# Patient Record
Sex: Female | Born: 1950
Health system: Southern US, Community
[De-identification: ages and names within clinical notes are randomized; demographics above are authoritative.]

## PROBLEM LIST (undated history)

## (undated) DIAGNOSIS — R928 Other abnormal and inconclusive findings on diagnostic imaging of breast: Secondary | ICD-10-CM

## (undated) DIAGNOSIS — Q211 Atrial septal defect, unspecified: Secondary | ICD-10-CM

## (undated) DIAGNOSIS — M1990 Unspecified osteoarthritis, unspecified site: Secondary | ICD-10-CM

## (undated) DIAGNOSIS — F419 Anxiety disorder, unspecified: Secondary | ICD-10-CM

## (undated) DIAGNOSIS — K529 Noninfective gastroenteritis and colitis, unspecified: Secondary | ICD-10-CM

## (undated) DIAGNOSIS — R609 Edema, unspecified: Secondary | ICD-10-CM

## (undated) DIAGNOSIS — I2489 Other forms of acute ischemic heart disease: Secondary | ICD-10-CM

## (undated) DIAGNOSIS — M549 Dorsalgia, unspecified: Secondary | ICD-10-CM

## (undated) DIAGNOSIS — Z87442 Personal history of urinary calculi: Secondary | ICD-10-CM

## (undated) DIAGNOSIS — I10 Essential (primary) hypertension: Secondary | ICD-10-CM

## (undated) DIAGNOSIS — Z951 Presence of aortocoronary bypass graft: Secondary | ICD-10-CM

## (undated) DIAGNOSIS — R4189 Other symptoms and signs involving cognitive functions and awareness: Secondary | ICD-10-CM

## (undated) DIAGNOSIS — M109 Gout, unspecified: Secondary | ICD-10-CM

## (undated) DIAGNOSIS — R0989 Other specified symptoms and signs involving the circulatory and respiratory systems: Secondary | ICD-10-CM

## (undated) DIAGNOSIS — I209 Angina pectoris, unspecified: Secondary | ICD-10-CM

## (undated) DIAGNOSIS — I248 Other forms of acute ischemic heart disease: Secondary | ICD-10-CM

## (undated) DIAGNOSIS — F32A Depression, unspecified: Secondary | ICD-10-CM

## (undated) DIAGNOSIS — Z22322 Carrier or suspected carrier of Methicillin resistant Staphylococcus aureus: Secondary | ICD-10-CM

## (undated) DIAGNOSIS — T7491XA Unspecified adult maltreatment, confirmed, initial encounter: Secondary | ICD-10-CM

## (undated) DIAGNOSIS — E7800 Pure hypercholesterolemia, unspecified: Secondary | ICD-10-CM

## (undated) DIAGNOSIS — I2510 Atherosclerotic heart disease of native coronary artery without angina pectoris: Secondary | ICD-10-CM

## (undated) DIAGNOSIS — E119 Type 2 diabetes mellitus without complications: Secondary | ICD-10-CM

## (undated) DIAGNOSIS — G8929 Other chronic pain: Secondary | ICD-10-CM

## (undated) DIAGNOSIS — K219 Gastro-esophageal reflux disease without esophagitis: Secondary | ICD-10-CM

## (undated) DIAGNOSIS — I739 Peripheral vascular disease, unspecified: Secondary | ICD-10-CM

## (undated) DIAGNOSIS — F329 Major depressive disorder, single episode, unspecified: Secondary | ICD-10-CM

## (undated) DIAGNOSIS — R0602 Shortness of breath: Secondary | ICD-10-CM

## (undated) DIAGNOSIS — A6000 Herpesviral infection of urogenital system, unspecified: Secondary | ICD-10-CM

## (undated) DIAGNOSIS — M8580 Other specified disorders of bone density and structure, unspecified site: Secondary | ICD-10-CM

## (undated) DIAGNOSIS — L309 Dermatitis, unspecified: Secondary | ICD-10-CM

## (undated) DIAGNOSIS — Z9861 Coronary angioplasty status: Secondary | ICD-10-CM

## (undated) DIAGNOSIS — J449 Chronic obstructive pulmonary disease, unspecified: Secondary | ICD-10-CM

## (undated) DIAGNOSIS — I259 Chronic ischemic heart disease, unspecified: Secondary | ICD-10-CM

## (undated) DIAGNOSIS — I6529 Occlusion and stenosis of unspecified carotid artery: Secondary | ICD-10-CM

## (undated) DIAGNOSIS — M545 Low back pain, unspecified: Secondary | ICD-10-CM

## (undated) HISTORY — PX: EYE SURGERY: SHX253

## (undated) HISTORY — PX: OTHER SURGICAL HISTORY: SHX169

## (undated) HISTORY — DX: Presence of aortocoronary bypass graft: Z95.1

## (undated) HISTORY — DX: Dermatitis, unspecified: L30.9

## (undated) HISTORY — DX: Occlusion and stenosis of unspecified carotid artery: I65.29

## (undated) HISTORY — DX: Angina pectoris, unspecified: I20.9

## (undated) HISTORY — DX: Other forms of acute ischemic heart disease: I24.89

## (undated) HISTORY — DX: Coronary angioplasty status: Z98.61

## (undated) HISTORY — DX: Other abnormal and inconclusive findings on diagnostic imaging of breast: R92.8

## (undated) HISTORY — PX: TUBAL LIGATION: SHX77

## (undated) HISTORY — DX: Atherosclerotic heart disease of native coronary artery without angina pectoris: I25.10

## (undated) HISTORY — DX: Chronic obstructive pulmonary disease, unspecified: J44.9

## (undated) HISTORY — DX: Other forms of acute ischemic heart disease: I24.8

## (undated) HISTORY — DX: Low back pain, unspecified: M54.50

## (undated) HISTORY — DX: Carrier or suspected carrier of methicillin resistant Staphylococcus aureus: Z22.322

## (undated) HISTORY — DX: Atrial septal defect: Q21.1

## (undated) HISTORY — DX: Noninfective gastroenteritis and colitis, unspecified: K52.9

## (undated) HISTORY — DX: Shortness of breath: R06.02

## (undated) HISTORY — DX: Peripheral vascular disease, unspecified: I73.9

## (undated) HISTORY — DX: Other specified symptoms and signs involving the circulatory and respiratory systems: R09.89

## (undated) HISTORY — DX: Unspecified adult maltreatment, confirmed, initial encounter: T74.91XA

## (undated) HISTORY — DX: Herpesviral infection of urogenital system, unspecified: A60.00

## (undated) HISTORY — DX: Unspecified osteoarthritis, unspecified site: M19.90

## (undated) HISTORY — DX: Gout, unspecified: M10.9

## (undated) HISTORY — DX: Edema, unspecified: R60.9

## (undated) HISTORY — DX: Atrial septal defect, unspecified: Q21.10

---

## 1998-01-15 ENCOUNTER — Ambulatory Visit (HOSPITAL_COMMUNITY): Admission: RE | Admit: 1998-01-15 | Discharge: 1998-01-15 | Payer: Self-pay | Admitting: Cardiology

## 1998-01-15 HISTORY — PX: CORONARY ANGIOPLASTY WITH STENT PLACEMENT: SHX49

## 1998-01-19 ENCOUNTER — Observation Stay (HOSPITAL_COMMUNITY): Admission: AD | Admit: 1998-01-19 | Discharge: 1998-01-20 | Payer: Self-pay | Admitting: Cardiology

## 1998-01-26 ENCOUNTER — Ambulatory Visit (HOSPITAL_COMMUNITY): Admission: RE | Admit: 1998-01-26 | Discharge: 1998-01-27 | Payer: Self-pay | Admitting: Cardiology

## 1998-01-29 ENCOUNTER — Ambulatory Visit (HOSPITAL_COMMUNITY): Admission: RE | Admit: 1998-01-29 | Discharge: 1998-01-29 | Payer: Self-pay | Admitting: Cardiology

## 1998-04-03 ENCOUNTER — Other Ambulatory Visit: Admission: RE | Admit: 1998-04-03 | Discharge: 1998-04-03 | Payer: Self-pay | Admitting: Family Medicine

## 1998-04-16 ENCOUNTER — Inpatient Hospital Stay (HOSPITAL_COMMUNITY): Admission: EM | Admit: 1998-04-16 | Discharge: 1998-04-17 | Payer: Self-pay | Admitting: Emergency Medicine

## 1998-06-01 ENCOUNTER — Ambulatory Visit (HOSPITAL_COMMUNITY): Admission: RE | Admit: 1998-06-01 | Discharge: 1998-06-01 | Payer: Self-pay | Admitting: Family Medicine

## 1998-06-01 ENCOUNTER — Encounter: Payer: Self-pay | Admitting: Family Medicine

## 1998-06-08 ENCOUNTER — Ambulatory Visit (HOSPITAL_COMMUNITY): Admission: RE | Admit: 1998-06-08 | Discharge: 1998-06-08 | Payer: Self-pay | Admitting: Cardiology

## 1998-07-17 ENCOUNTER — Ambulatory Visit (HOSPITAL_COMMUNITY): Admission: RE | Admit: 1998-07-17 | Discharge: 1998-07-17 | Payer: Self-pay | Admitting: Gastroenterology

## 1998-07-17 ENCOUNTER — Encounter: Payer: Self-pay | Admitting: Gastroenterology

## 1998-10-30 ENCOUNTER — Encounter: Admission: RE | Admit: 1998-10-30 | Discharge: 1998-10-30 | Payer: Self-pay | Admitting: Obstetrics & Gynecology

## 1998-11-07 ENCOUNTER — Encounter: Payer: Self-pay | Admitting: Obstetrics & Gynecology

## 1998-11-07 ENCOUNTER — Ambulatory Visit (HOSPITAL_COMMUNITY): Admission: RE | Admit: 1998-11-07 | Discharge: 1998-11-07 | Payer: Self-pay | Admitting: Obstetrics & Gynecology

## 1998-11-13 ENCOUNTER — Encounter: Admission: RE | Admit: 1998-11-13 | Discharge: 1998-11-13 | Payer: Self-pay | Admitting: Obstetrics & Gynecology

## 1998-11-26 ENCOUNTER — Ambulatory Visit (HOSPITAL_COMMUNITY): Admission: RE | Admit: 1998-11-26 | Discharge: 1998-11-26 | Payer: Self-pay | Admitting: Obstetrics & Gynecology

## 1998-12-21 ENCOUNTER — Encounter: Admission: RE | Admit: 1998-12-21 | Discharge: 1998-12-21 | Payer: Self-pay | Admitting: Obstetrics & Gynecology

## 2000-08-17 ENCOUNTER — Ambulatory Visit (HOSPITAL_COMMUNITY): Admission: RE | Admit: 2000-08-17 | Discharge: 2000-08-18 | Payer: Self-pay | Admitting: Cardiology

## 2000-08-17 HISTORY — PX: CORONARY ANGIOPLASTY WITH STENT PLACEMENT: SHX49

## 2001-01-14 ENCOUNTER — Inpatient Hospital Stay (HOSPITAL_COMMUNITY): Admission: EM | Admit: 2001-01-14 | Discharge: 2001-01-24 | Payer: Self-pay

## 2001-01-19 HISTORY — PX: CORONARY ANGIOPLASTY WITH STENT PLACEMENT: SHX49

## 2001-01-20 ENCOUNTER — Encounter: Payer: Self-pay | Admitting: Cardiology

## 2001-01-20 ENCOUNTER — Encounter: Payer: Self-pay | Admitting: Surgery

## 2001-01-20 HISTORY — PX: CORONARY ANGIOPLASTY WITH STENT PLACEMENT: SHX49

## 2001-01-21 ENCOUNTER — Encounter: Payer: Self-pay | Admitting: Surgery

## 2001-01-22 ENCOUNTER — Encounter: Payer: Self-pay | Admitting: Surgery

## 2001-01-23 ENCOUNTER — Encounter: Payer: Self-pay | Admitting: Surgery

## 2001-04-20 ENCOUNTER — Encounter: Admission: RE | Admit: 2001-04-20 | Discharge: 2001-04-20 | Payer: Self-pay

## 2003-09-13 ENCOUNTER — Encounter (INDEPENDENT_AMBULATORY_CARE_PROVIDER_SITE_OTHER): Payer: Self-pay | Admitting: Specialist

## 2003-09-13 ENCOUNTER — Ambulatory Visit (HOSPITAL_COMMUNITY): Admission: RE | Admit: 2003-09-13 | Discharge: 2003-09-13 | Payer: Self-pay | Admitting: Gastroenterology

## 2003-11-15 ENCOUNTER — Encounter: Admission: RE | Admit: 2003-11-15 | Discharge: 2003-11-15 | Payer: Self-pay | Admitting: General Surgery

## 2003-11-22 ENCOUNTER — Ambulatory Visit (HOSPITAL_COMMUNITY): Admission: RE | Admit: 2003-11-22 | Discharge: 2003-11-22 | Payer: Self-pay | Admitting: Cardiology

## 2003-12-05 ENCOUNTER — Encounter (INDEPENDENT_AMBULATORY_CARE_PROVIDER_SITE_OTHER): Payer: Self-pay | Admitting: Specialist

## 2003-12-05 ENCOUNTER — Inpatient Hospital Stay (HOSPITAL_COMMUNITY): Admission: RE | Admit: 2003-12-05 | Discharge: 2003-12-07 | Payer: Self-pay | Admitting: Obstetrics and Gynecology

## 2004-02-02 HISTORY — PX: CORONARY ANGIOPLASTY WITH STENT PLACEMENT: SHX49

## 2004-02-04 ENCOUNTER — Inpatient Hospital Stay (HOSPITAL_COMMUNITY): Admission: EM | Admit: 2004-02-04 | Discharge: 2004-02-05 | Payer: Self-pay

## 2004-04-30 ENCOUNTER — Ambulatory Visit (HOSPITAL_COMMUNITY): Admission: RE | Admit: 2004-04-30 | Discharge: 2004-04-30 | Payer: Self-pay | Admitting: Obstetrics and Gynecology

## 2004-04-30 ENCOUNTER — Encounter (INDEPENDENT_AMBULATORY_CARE_PROVIDER_SITE_OTHER): Payer: Self-pay | Admitting: *Deleted

## 2005-09-24 ENCOUNTER — Ambulatory Visit (HOSPITAL_COMMUNITY): Admission: RE | Admit: 2005-09-24 | Discharge: 2005-09-24 | Payer: Self-pay | Admitting: Cardiology

## 2005-10-20 ENCOUNTER — Ambulatory Visit (HOSPITAL_COMMUNITY): Admission: RE | Admit: 2005-10-20 | Discharge: 2005-10-20 | Payer: Self-pay | Admitting: Interventional Cardiology

## 2006-05-19 IMAGING — CR DG CHEST 2V
2 series · 2 of 2 positions shown · non-contrast
Comparison: 02/03/04.

CLINICAL DATA: PPD.  Shortness of breath.  Chest complaints.  Smoker.  Hypertension.  CABG. 
 CHEST ? 2 VIEW:

[view not recorded (1 of 2)]
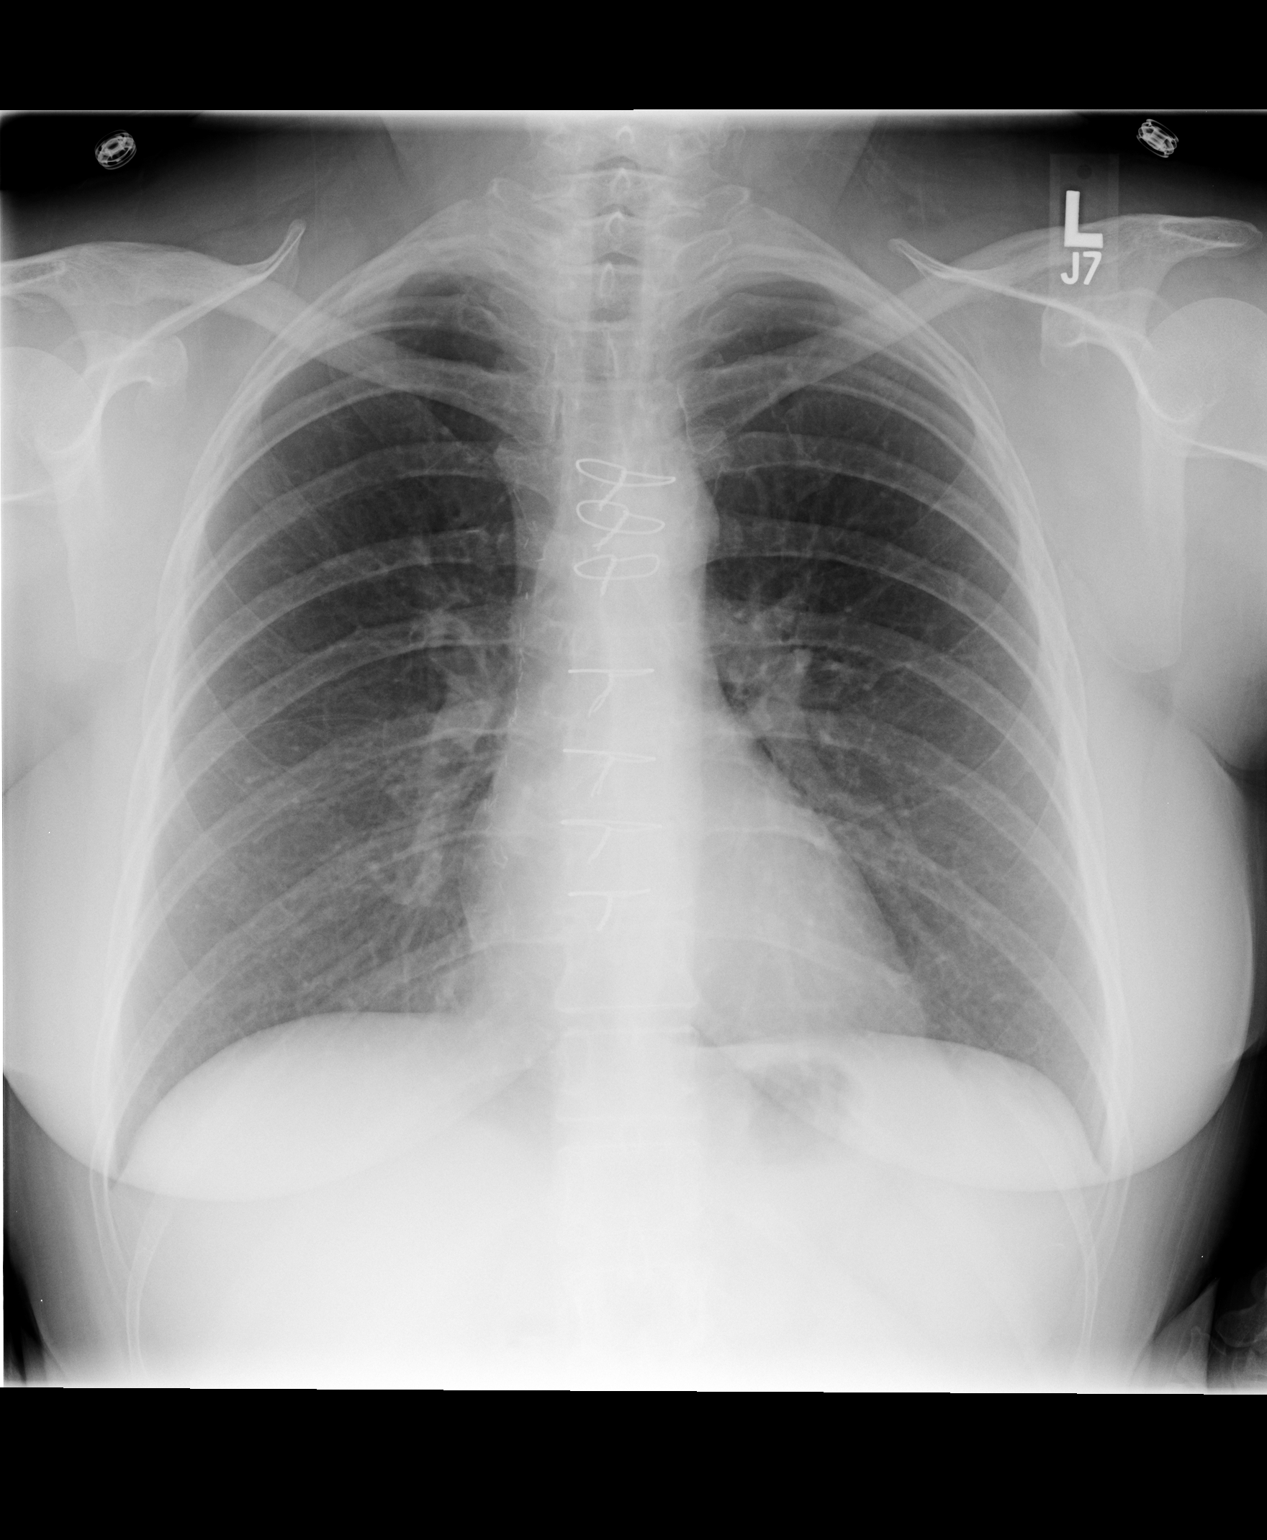

[view not recorded (2 of 2)]
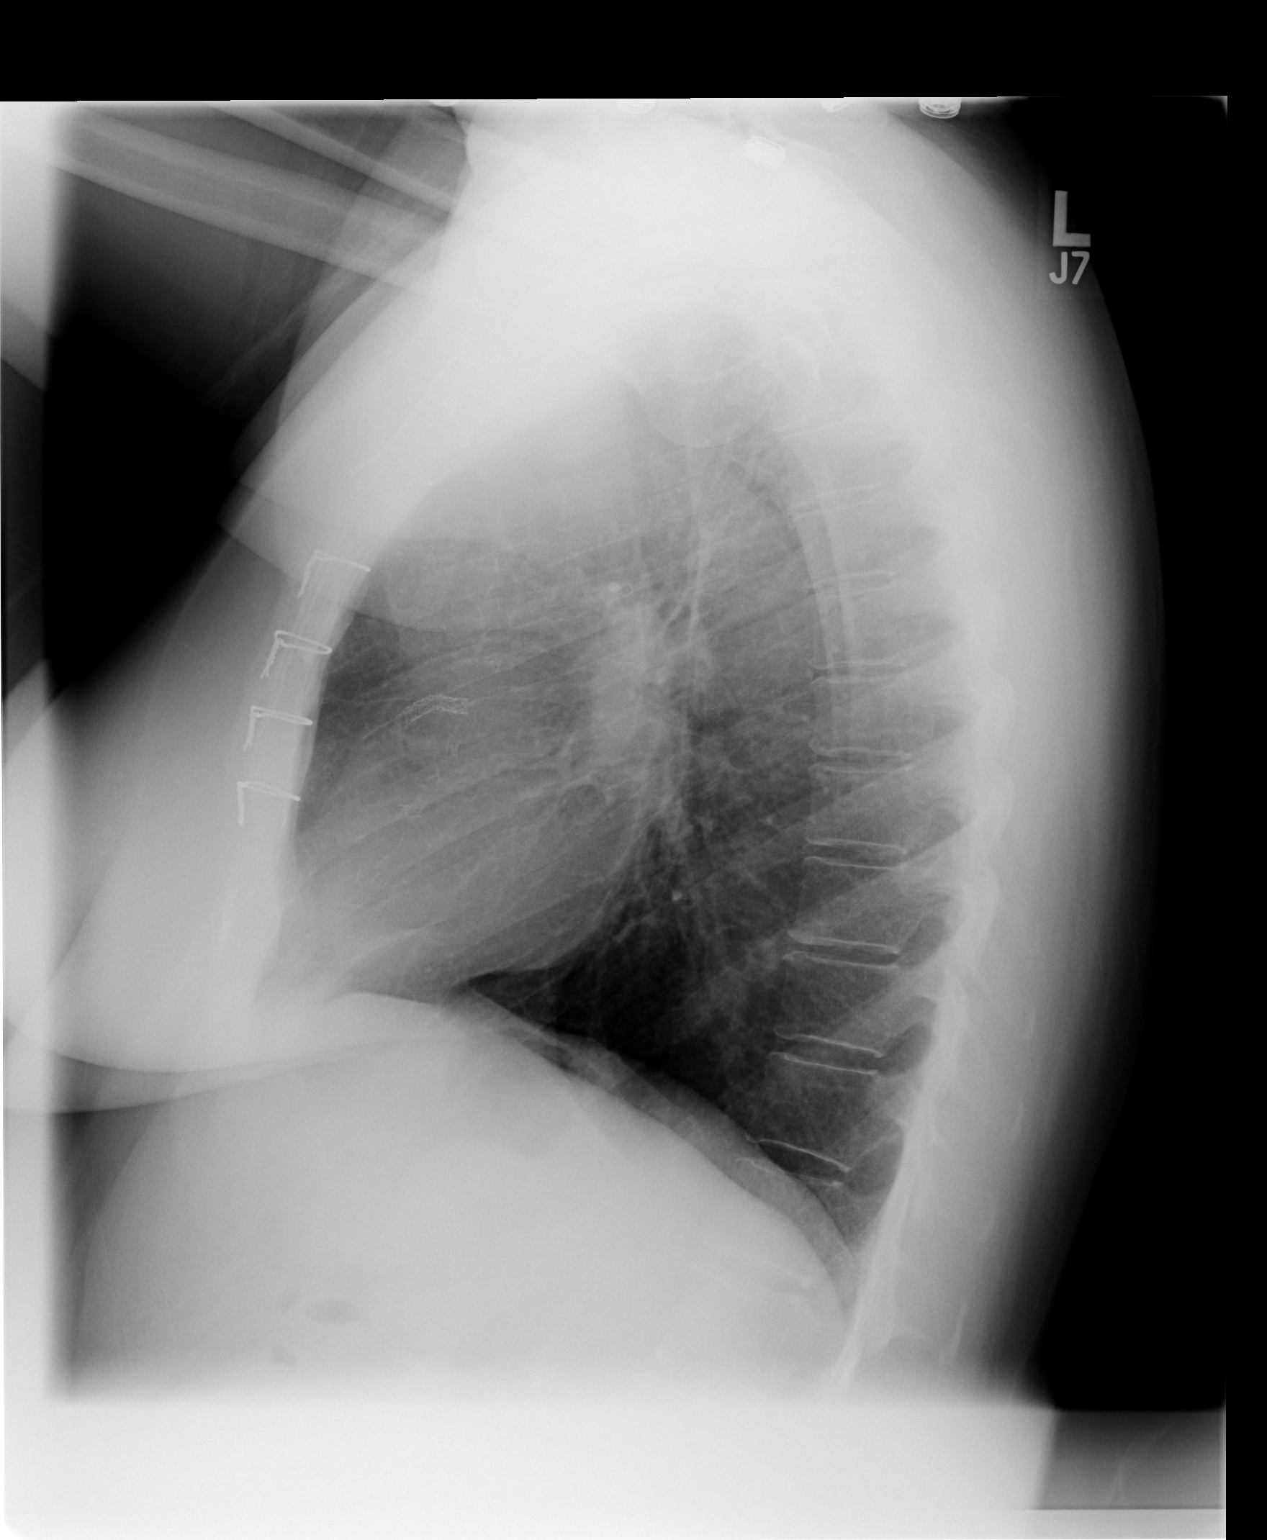

[2 of 2 positions shown; findings below may reference images not displayed]

FINDINGS: The heart size and mediastinal contours are within normal limits.  Both lungs are clear.  The visualized skeletal structures are unremarkable.  Sternally are seen mediastinal clips.  Calcific density in the native coronary artery branch and also coronary artery stent noted on the lateral view.
IMPRESSION: No active cardiopulmonary disease.

## 2008-06-18 ENCOUNTER — Inpatient Hospital Stay (HOSPITAL_COMMUNITY): Admission: EM | Admit: 2008-06-18 | Discharge: 2008-06-20 | Payer: Self-pay | Admitting: Emergency Medicine

## 2008-09-05 ENCOUNTER — Ambulatory Visit: Payer: Self-pay | Admitting: *Deleted

## 2008-09-26 ENCOUNTER — Ambulatory Visit: Payer: Self-pay | Admitting: Vascular Surgery

## 2009-01-24 ENCOUNTER — Ambulatory Visit: Payer: Self-pay | Admitting: Family Medicine

## 2009-01-24 ENCOUNTER — Inpatient Hospital Stay (HOSPITAL_COMMUNITY): Admission: EM | Admit: 2009-01-24 | Discharge: 2009-01-26 | Payer: Self-pay | Admitting: *Deleted

## 2009-01-25 ENCOUNTER — Encounter: Payer: Self-pay | Admitting: Family Medicine

## 2010-04-25 ENCOUNTER — Inpatient Hospital Stay (HOSPITAL_COMMUNITY): Admission: EM | Admit: 2010-04-25 | Discharge: 2010-04-27 | Payer: Self-pay | Admitting: Emergency Medicine

## 2010-04-26 ENCOUNTER — Encounter (INDEPENDENT_AMBULATORY_CARE_PROVIDER_SITE_OTHER): Payer: Self-pay | Admitting: Emergency Medicine

## 2010-04-26 ENCOUNTER — Ambulatory Visit: Payer: Self-pay | Admitting: Vascular Surgery

## 2010-05-13 ENCOUNTER — Emergency Department (HOSPITAL_COMMUNITY): Admission: EM | Admit: 2010-05-13 | Discharge: 2010-05-13 | Payer: Self-pay | Admitting: Emergency Medicine

## 2010-10-15 ENCOUNTER — Emergency Department (HOSPITAL_COMMUNITY)
Admission: EM | Admit: 2010-10-15 | Discharge: 2010-10-16 | Payer: Self-pay | Source: Home / Self Care | Admitting: Emergency Medicine

## 2010-10-16 LAB — CBC
HCT: 37.5 % (ref 36.0–46.0)
Hemoglobin: 13.5 g/dL (ref 12.0–15.0)
MCH: 34.9 pg — ABNORMAL HIGH (ref 26.0–34.0)
MCHC: 36 g/dL (ref 30.0–36.0)
MCV: 96.9 fL (ref 78.0–100.0)
Platelets: 199 10*3/uL (ref 150–400)
RBC: 3.87 MIL/uL (ref 3.87–5.11)
RDW: 14.1 % (ref 11.5–15.5)
WBC: 8.2 10*3/uL (ref 4.0–10.5)

## 2010-10-16 LAB — COMPREHENSIVE METABOLIC PANEL
ALT: 13 U/L (ref 0–35)
AST: 22 U/L (ref 0–37)
Albumin: 3.7 g/dL (ref 3.5–5.2)
Alkaline Phosphatase: 78 U/L (ref 39–117)
BUN: 5 mg/dL — ABNORMAL LOW (ref 6–23)
CO2: 21 mEq/L (ref 19–32)
Calcium: 9.1 mg/dL (ref 8.4–10.5)
Chloride: 108 mEq/L (ref 96–112)
Creatinine, Ser: 0.95 mg/dL (ref 0.4–1.2)
GFR calc Af Amer: >60 mL/min (ref >60)
GFR calc non Af Amer: >60 mL/min (ref >60)
Glucose, Bld: 92 mg/dL (ref 70–99)
Potassium: 3.8 mEq/L (ref 3.5–5.1)
Sodium: 138 mEq/L (ref 135–145)
Total Bilirubin: 0.5 mg/dL (ref 0.3–1.2)
Total Protein: 6.4 g/dL (ref 6.0–8.3)

## 2010-10-16 LAB — PROTIME-INR
INR: 0.93 (ref 0.00–1.49)
Prothrombin Time: 12.7 seconds (ref 11.6–15.2)

## 2010-10-16 LAB — APTT: aPTT: 29 seconds (ref 24–37)

## 2010-10-16 LAB — LIPASE, BLOOD: Lipase: 18 U/L (ref 11–59)

## 2010-10-21 LAB — DIFFERENTIAL
Basophils Absolute: 0 10*3/uL (ref 0.0–0.1)
Basophils Relative: 0 % (ref 0–1)
Eosinophils Absolute: 0.2 10*3/uL (ref 0.0–0.7)
Eosinophils Relative: 2 % (ref 0–5)
Lymphocytes Relative: 15 % (ref 12–46)
Lymphs Abs: 1.2 10*3/uL (ref 0.7–4.0)
Monocytes Absolute: 0.8 10*3/uL (ref 0.1–1.0)
Monocytes Relative: 10 % (ref 3–12)
Neutro Abs: 6 10*3/uL (ref 1.7–7.7)
Neutrophils Relative %: 73 % (ref 43–77)

## 2010-12-14 LAB — POCT CARDIAC MARKERS
CKMB, poc: <1 ng/mL — ABNORMAL LOW (ref 1.0–8.0)
Myoglobin, poc: 67.4 ng/mL (ref 12–200)
Troponin i, poc: <0.05 ng/mL (ref 0.00–0.09)

## 2010-12-14 LAB — DIC (DISSEMINATED INTRAVASCULAR COAGULATION) PANEL (NOT AT ARMC)
D-Dimer, Quant: 0.59 ug/mL-FEU — ABNORMAL HIGH (ref 0.00–0.48)
Fibrinogen: 345 mg/dL (ref 204–475)
INR: 0.98 (ref 0.00–1.49)

## 2010-12-14 LAB — CBC
HCT: 37.9 % (ref 36.0–46.0)
Hemoglobin: 13.4 g/dL (ref 12.0–15.0)
MCH: 37 pg — ABNORMAL HIGH (ref 26.0–34.0)
MCHC: 35.4 g/dL (ref 30.0–36.0)
MCV: 104.8 fL — ABNORMAL HIGH (ref 78.0–100.0)
Platelets: 163 10*3/uL (ref 150–400)
RBC: 3.62 MIL/uL — ABNORMAL LOW (ref 3.87–5.11)
RDW: 13.7 % (ref 11.5–15.5)
WBC: 5.5 10*3/uL (ref 4.0–10.5)

## 2010-12-14 LAB — COMPREHENSIVE METABOLIC PANEL
ALT: 12 U/L (ref 0–35)
AST: 20 U/L (ref 0–37)
Albumin: 3.5 g/dL (ref 3.5–5.2)
Alkaline Phosphatase: 72 U/L (ref 39–117)
BUN: 9 mg/dL (ref 6–23)
CO2: 27 mEq/L (ref 19–32)
Calcium: 9 mg/dL (ref 8.4–10.5)
Chloride: 104 mEq/L (ref 96–112)
Creatinine, Ser: 1 mg/dL (ref 0.4–1.2)
GFR calc Af Amer: >60 mL/min (ref >60)
GFR calc non Af Amer: 57 mL/min — ABNORMAL LOW (ref >60)
Glucose, Bld: 116 mg/dL — ABNORMAL HIGH (ref 70–99)
Potassium: 3.8 mEq/L (ref 3.5–5.1)
Sodium: 139 mEq/L (ref 135–145)
Total Bilirubin: 0.4 mg/dL (ref 0.3–1.2)
Total Protein: 5.9 g/dL — ABNORMAL LOW (ref 6.0–8.3)

## 2010-12-14 LAB — TROPONIN I
Troponin I: 0.01 ng/mL (ref 0.00–0.06)
Troponin I: 0.02 ng/mL (ref 0.00–0.06)
Troponin I: <0.01 ng/mL (ref 0.00–0.06)

## 2010-12-14 LAB — GLUCOSE, CAPILLARY
Glucose-Capillary: 104 mg/dL — ABNORMAL HIGH (ref 70–99)
Glucose-Capillary: 105 mg/dL — ABNORMAL HIGH (ref 70–99)
Glucose-Capillary: 111 mg/dL — ABNORMAL HIGH (ref 70–99)
Glucose-Capillary: 118 mg/dL — ABNORMAL HIGH (ref 70–99)
Glucose-Capillary: 145 mg/dL — ABNORMAL HIGH (ref 70–99)
Glucose-Capillary: 146 mg/dL — ABNORMAL HIGH (ref 70–99)
Glucose-Capillary: 230 mg/dL — ABNORMAL HIGH (ref 70–99)

## 2010-12-14 LAB — RAPID URINE DRUG SCREEN, HOSP PERFORMED
Amphetamines: NOT DETECTED
Barbiturates: NOT DETECTED
Benzodiazepines: POSITIVE — AB
Cocaine: NOT DETECTED
Opiates: POSITIVE — AB
Tetrahydrocannabinol: NOT DETECTED

## 2010-12-14 LAB — URINE CULTURE: Colony Count: >=100000

## 2010-12-14 LAB — URINALYSIS, ROUTINE W REFLEX MICROSCOPIC
Bilirubin Urine: NEGATIVE
Glucose, UA: NEGATIVE mg/dL
Hgb urine dipstick: NEGATIVE
Ketones, ur: NEGATIVE mg/dL
Nitrite: NEGATIVE
Protein, ur: NEGATIVE mg/dL
Specific Gravity, Urine: 1.022 (ref 1.005–1.030)
Urobilinogen, UA: 1 mg/dL (ref 0.0–1.0)
pH: 5.5 (ref 5.0–8.0)

## 2010-12-14 LAB — PROTIME-INR
INR: 0.94 (ref 0.00–1.49)
Prothrombin Time: 12.5 seconds (ref 11.6–15.2)

## 2010-12-14 LAB — ETHANOL: Alcohol, Ethyl (B): <5 mg/dL (ref 0–10)

## 2010-12-14 LAB — DIFFERENTIAL
Basophils Absolute: 0 10*3/uL (ref 0.0–0.1)
Basophils Relative: 1 % (ref 0–1)
Eosinophils Absolute: 0.3 10*3/uL (ref 0.0–0.7)
Eosinophils Relative: 6 % — ABNORMAL HIGH (ref 0–5)
Lymphocytes Relative: 32 % (ref 12–46)
Lymphs Abs: 1.7 10*3/uL (ref 0.7–4.0)
Monocytes Absolute: 0.7 10*3/uL (ref 0.1–1.0)
Monocytes Relative: 12 % (ref 3–12)
Neutro Abs: 2.8 10*3/uL (ref 1.7–7.7)
Neutrophils Relative %: 50 % (ref 43–77)

## 2010-12-14 LAB — LIPID PANEL
Cholesterol: 134 mg/dL (ref 0–200)
HDL: 56 mg/dL (ref >39)
LDL Cholesterol: 50 mg/dL (ref 0–99)
Total CHOL/HDL Ratio: 2.4 RATIO
Triglycerides: 140 mg/dL (ref <150)
VLDL: 28 mg/dL (ref 0–40)

## 2010-12-14 LAB — CK TOTAL AND CKMB (NOT AT ARMC)
CK, MB: 1.2 ng/mL (ref 0.3–4.0)
CK, MB: 1.3 ng/mL (ref 0.3–4.0)
CK, MB: 1.3 ng/mL (ref 0.3–4.0)
Relative Index: INVALID (ref 0.0–2.5)
Relative Index: INVALID (ref 0.0–2.5)
Relative Index: INVALID (ref 0.0–2.5)
Total CK: 38 U/L (ref 7–177)
Total CK: 44 U/L (ref 7–177)
Total CK: 70 U/L (ref 7–177)

## 2010-12-14 LAB — VITAMIN B1: Vitamin B1 (Thiamine): 12 nmol/L (ref 9–44)

## 2010-12-14 LAB — DIC (DISSEMINATED INTRAVASCULAR COAGULATION)PANEL
Platelets: 158 10*3/uL (ref 150–400)
Prothrombin Time: 12.9 seconds (ref 11.6–15.2)
Smear Review: NONE SEEN
aPTT: 33 seconds (ref 24–37)

## 2010-12-14 LAB — APTT: aPTT: 27 seconds (ref 24–37)

## 2010-12-14 LAB — RPR: RPR Ser Ql: NONREACTIVE

## 2010-12-14 LAB — HEMOGLOBIN A1C
Hgb A1c MFr Bld: 5.4 % (ref <5.7)
Mean Plasma Glucose: 108 mg/dL (ref <117)

## 2010-12-14 LAB — ANA: Anti Nuclear Antibody(ANA): NEGATIVE

## 2010-12-14 LAB — VITAMIN B12: Vitamin B-12: 261 pg/mL (ref 211–911)

## 2011-01-08 LAB — CARDIAC PANEL(CRET KIN+CKTOT+MB+TROPI)
CK, MB: 1.4 ng/mL (ref 0.3–4.0)
Relative Index: INVALID (ref 0.0–2.5)
Total CK: 61 U/L (ref 7–177)
Troponin I: 0.02 ng/mL (ref 0.00–0.06)

## 2011-01-08 LAB — CBC
HCT: 36.2 % (ref 36.0–46.0)
HCT: 41 % (ref 36.0–46.0)
Hemoglobin: 12.9 g/dL (ref 12.0–15.0)
Hemoglobin: 14.6 g/dL (ref 12.0–15.0)
MCHC: 35.7 g/dL (ref 30.0–36.0)
MCHC: 35.7 g/dL (ref 30.0–36.0)
MCV: 104.1 fL — ABNORMAL HIGH (ref 78.0–100.0)
MCV: 104.3 fL — ABNORMAL HIGH (ref 78.0–100.0)
Platelets: 178 10*3/uL (ref 150–400)
Platelets: 222 10*3/uL (ref 150–400)
RBC: 3.47 MIL/uL — ABNORMAL LOW (ref 3.87–5.11)
RBC: 3.93 MIL/uL (ref 3.87–5.11)
RDW: 14.6 % (ref 11.5–15.5)
RDW: 14.8 % (ref 11.5–15.5)
WBC: 5.4 10*3/uL (ref 4.0–10.5)
WBC: 7.7 10*3/uL (ref 4.0–10.5)

## 2011-01-08 LAB — TSH: TSH: 1.125 u[IU]/mL (ref 0.350–4.500)

## 2011-01-08 LAB — BASIC METABOLIC PANEL
BUN: 5 mg/dL — ABNORMAL LOW (ref 6–23)
CO2: 26 mEq/L (ref 19–32)
Calcium: 8.7 mg/dL (ref 8.4–10.5)
Chloride: 106 mEq/L (ref 96–112)
Creatinine, Ser: 0.83 mg/dL (ref 0.4–1.2)
GFR calc Af Amer: >60 mL/min (ref >60)
GFR calc non Af Amer: >60 mL/min (ref >60)
Glucose, Bld: 145 mg/dL — ABNORMAL HIGH (ref 70–99)
Potassium: 3.4 mEq/L — ABNORMAL LOW (ref 3.5–5.1)
Sodium: 143 mEq/L (ref 135–145)

## 2011-01-08 LAB — CK TOTAL AND CKMB (NOT AT ARMC)
CK, MB: 1.3 ng/mL (ref 0.3–4.0)
Relative Index: INVALID (ref 0.0–2.5)
Total CK: 59 U/L (ref 7–177)

## 2011-01-08 LAB — RAPID URINE DRUG SCREEN, HOSP PERFORMED
Amphetamines: NOT DETECTED
Barbiturates: NOT DETECTED
Benzodiazepines: POSITIVE — AB
Cocaine: NOT DETECTED
Opiates: POSITIVE — AB
Tetrahydrocannabinol: NOT DETECTED

## 2011-01-08 LAB — COMPREHENSIVE METABOLIC PANEL
ALT: 9 U/L (ref 0–35)
AST: 17 U/L (ref 0–37)
Albumin: 3.1 g/dL — ABNORMAL LOW (ref 3.5–5.2)
Alkaline Phosphatase: 76 U/L (ref 39–117)
BUN: 3 mg/dL — ABNORMAL LOW (ref 6–23)
CO2: 25 mEq/L (ref 19–32)
Calcium: 8.8 mg/dL (ref 8.4–10.5)
Chloride: 109 mEq/L (ref 96–112)
Creatinine, Ser: 0.79 mg/dL (ref 0.4–1.2)
GFR calc Af Amer: >60 mL/min (ref >60)
GFR calc non Af Amer: >60 mL/min (ref >60)
Glucose, Bld: 100 mg/dL — ABNORMAL HIGH (ref 70–99)
Potassium: 3 mEq/L — ABNORMAL LOW (ref 3.5–5.1)
Sodium: 143 mEq/L (ref 135–145)
Total Bilirubin: 0.4 mg/dL (ref 0.3–1.2)
Total Protein: 5.7 g/dL — ABNORMAL LOW (ref 6.0–8.3)

## 2011-01-08 LAB — POCT I-STAT, CHEM 8
BUN: 4 mg/dL — ABNORMAL LOW (ref 6–23)
Calcium, Ion: 1.19 mmol/L (ref 1.12–1.32)
Chloride: 105 mEq/L (ref 96–112)
Creatinine, Ser: 0.9 mg/dL (ref 0.4–1.2)
Glucose, Bld: 128 mg/dL — ABNORMAL HIGH (ref 70–99)
HCT: 41 % (ref 36.0–46.0)
Hemoglobin: 13.9 g/dL (ref 12.0–15.0)
Potassium: 2.9 mEq/L — ABNORMAL LOW (ref 3.5–5.1)
Sodium: 140 mEq/L (ref 135–145)
TCO2: 26 mmol/L (ref 0–100)

## 2011-01-08 LAB — DIFFERENTIAL
Basophils Absolute: 0.1 10*3/uL (ref 0.0–0.1)
Basophils Relative: 1 % (ref 0–1)
Eosinophils Absolute: 0.2 10*3/uL (ref 0.0–0.7)
Eosinophils Relative: 3 % (ref 0–5)
Lymphocytes Relative: 29 % (ref 12–46)
Lymphs Abs: 2.3 10*3/uL (ref 0.7–4.0)
Monocytes Absolute: 0.7 10*3/uL (ref 0.1–1.0)
Monocytes Relative: 9 % (ref 3–12)
Neutro Abs: 4.5 10*3/uL (ref 1.7–7.7)
Neutrophils Relative %: 58 % (ref 43–77)

## 2011-01-08 LAB — ETHANOL: Alcohol, Ethyl (B): <5 mg/dL (ref 0–10)

## 2011-01-08 LAB — TROPONIN I: Troponin I: 0.01 ng/mL (ref 0.00–0.06)

## 2011-01-08 LAB — APTT: aPTT: 36 seconds (ref 24–37)

## 2011-01-08 LAB — PROTIME-INR
INR: 1.1 (ref 0.00–1.49)
Prothrombin Time: 14.6 seconds (ref 11.6–15.2)

## 2011-01-08 LAB — HEMOGLOBIN A1C
Hgb A1c MFr Bld: 5.6 % (ref 4.6–6.1)
Mean Plasma Glucose: 114 mg/dL

## 2011-02-11 NOTE — Consult Note (Signed)
NAME:  Crystal [name], Crystal [name]                 ACCOUNT NO.:  [id number]   MEDICAL RECORD NO.:  [id number]          PATIENT TYPE:  INP   LOCATION:  3714                         FACILITY:  [facility]   PHYSICIAN:  Michael L. Reynolds, M.D.DATE OF BIRTH:  May 09, 1951   DATE OF CONSULTATION:  01/25/2009  DATE OF DISCHARGE:                                 CONSULTATION   PRIMARY CARE DOCTORS:  Francisca December, MD for cardiovascular and  Di Kindle. Edilia Bo, MD for vascular.   CHIEF COMPLAINT:  Blurred vision.   HISTORY OF PRESENT ILLNESS:  This is a 60 year old Caucasian female with  past medical history of CAD, status post CABG for TVD and hyperlipidemia  and hypertension who apparently started having blurred vision 3 days  prior to admission.  On Monday, January 22, 2009, the patient woke up with  blurred vision with the left being greater than her right.  She states  that her vision slowly improved throughout the day.  She attributed her  blurred vision was most likely due to a sty that she had in her right  eye.  Knowing that she had a sty in her right eye and her dog recently  had a sty in his eye, the patient took the dog's optic medication and  placed in her right eye hoping it would cure the problem.  She states  that this is the only medication change that she has had over the past  week.  The next morning, the patient woke up on January 23, 2009,  approximately around 10 o'clock, her daughter noticed she had a facial  droop on her right.  The patient noticed that she was slightly numb  around her right aspect of her face and her right pupil is still  dilated.  Majority of her vision was blurred when looking at distance  farther away.  The patient continued throughout the day with the right  facial droop and decreased sensation of V1, V2 and V3 portion of her  trigeminal nerve.  She also had slurred speech.  The patient went to bed  at night, woke up the next morning on January 24, 2009, noting that  the  slurred speech, facial numbness, and facial droop had dissipated;  however, her right pupil and blurred vision had not resolved.  It was at  this point that the patient came over to the [facility].  The  patient states that she has taken 4 days worth of her dog's medication,  but she does not know the name of this medication.  At present time, the  patient is comfortable sitting in her bed, has no neurological deficit  as far as the musculoskeletal.  No cranial nerve deficit other than the  dilated right pupil and blurred vision.  Her speech is clear.  Her  mentation is full and the patient is able to carry on a conversation  without any difficulty.   PAST MEDICAL HISTORY:  1. Hypertension.  2. Depression.  3. Irritable bowel syndrome.  4. GERD.  5. CABG of her right coronary artery in 2002.  6. Right superficial femoral atherectomy in 1999.  7. PVD.  8. Nephrolithiasis.  9. CAD.  10.Hypercholesterolemia.   MEDICATIONS:  1. Aspirin 325 daily.  2. Plavix 75 mg p.o. daily.  3. Zydol 50 mg p.o. b.i.d.  4. Allegra 180 mg p.o. daily.  5. Ativan 0.5 mg p.o. p.r.n.  6. Cymbalta 120 mg p.o. daily.  7. Folic acid 1 mg p.o. daily.  8. Mobic 15 mg daily.  9. Niaspan 1 g p.o. at bedtime.  10.Norvasc 50 mg p.o. b.i.d.  11.Imdur 20 mg p.o. b.i.d.  12.Prilosec 20 mg p.o. b.i.d.   ALLERGIES:  PENICILLIN, SULFA, and BACTRIM.   FAMILY HISTORY:  Mother is deceased secondary to MI.   SOCIAL HISTORY:  The patient smokes approximately 1 pack per day.  She  has done this for 20 years.  She denies any drinking or illicit  drugs.  She lives with her son and is retired.   REVIEW OF SYSTEMS:  All negative with the exception of above.   PHYSICAL EXAMINATION:  GENERAL:  The patient is alert and oriented.  She  is conversant.  She is able to follow 2-3 step commands.  She is well  enunciated.  PULMONARY:  Clear to auscultation bilaterally.  No rhonchi or wheezing.  CARDIOVASCULAR:   S1 and S2.  Regular rate and rhythm.  NECK:  Negative for bruits and supple.  ABDOMEN:  Soft, nontender, and nondistended with bowel sounds in all 4  quadrants.  SKIN:  Warm, dry and intact.  No excoriations, but ulcerations noted.  NEUROLOGIC:  Her left pupil is approximately 2 mm, reactive.  Her right  pupil is approximately 3 mm, nonreactive with normal hippus action.  She  does have a conjugate gaze.  Extraocular muscles are intact.  She has no  ptosis.  Visual fields are grossly intact; however, she does have  blurred vision with far objects.  No nystagmus was noted.  No saccadic  movements were noted.  Face was symmetric.  Tongue was midline.  Uvula  was midline.  The patient had no dysarthria or aphasia.  Sensation was  full, pinprick to light touch throughout V1 to V3 bilaterally.  Head  shrug, shoulder turn was full range of motion and strength.  Cerebellum,  coordination, finger-to-nose, heel-to-shin were both smooth without any  dysmetria.  Fine motor movements were within normal limits with known  dysmetria or clumsiness.   Gait was full within normal limits with good arm swing.  The patient  took normal steps and turned without any signs of disequilibrium.   Motor 5/5 grossly with no clonus, tremors.  She has good tone, good  bulk.  No asterixis noted.  She has full range of motion of all  extremities.   Reflexes are 3+ throughout with downgoing toes bilaterally.   Drift negative bilaterally upper and lower extremities.   Sensation is full of pinprick, vibration and light touch bilaterally.  Stereognosis and proprioception are grossly intact.   STROKE RISK FACTORS:  1. Hypertension.  2. Hypercholesterolemia.  3. Smoking.  4. Coronary artery disease.  5. Peripheral vascular disease.  6. She has an ABCD2 score of 3.   TPA was not given at the present time secondary to quick resolutions of  symptoms and unknown time of onset.   LABORATORY DATA:  Sodium is 143,  potassium 3.4, chloride is 106, bicarb  26, BUN 5, creatinine 0.85, HbA1c is 5.6 with glucose being 145, white  blood cell count is 5.4, hemoglobin  and hematocrit 12.9 and 36.2, and  platelets 174.  Urine drug screen showed positive for benzodiazepines  and opiates.   IMAGING AND TESTS:  MRI of the head showed no acute ischemia or insult.  MRA of the head showed no aneurysm or atherosclerosis noted throughout  the intracranial vessels.   ASSESSMENT:  1. Pupillary dilatation secondary to foreign substance for question      iritis.  2. Transient facial droop and dysarthria, question transient ischemic      attack.   TREATMENT AND PLAN:  Telemetry Doppler, echo, and labs are all  unrevealing, would not change any of her meds except for consideration  of adding statin and would recommend the patient seeing an outpatient  ophthalmology.     ______________________________  Felicie Morn, PA-C      Marolyn Hammock. Thad Ranger, M.D.  Electronically Signed    DS/MEDQ  D:  01/25/2009  T:  01/26/2009  Job:  [id number]   cc:   Casimiro Needle L. Thad Ranger, M.D.  Nestor Ramp, MD

## 2011-02-11 NOTE — Consult Note (Signed)
VASCULAR SURGERY CONSULTATION   Hausmann, Holly P  DOB:  03/24/1951                                       09/26/2008  ZOXWR#:[id number]   I saw the patient in the office today in consultation concerning her  right foot pain.  She was referred by Lonie Peak.  This is a pleasant  60 year old woman who states that approximately 3 months ago she noted  the gradual onset of pain on the dorsum of her right foot.  Her symptoms  are aggravated by walking but occur with standing or walking.  She also  gets some throbbing pain in her foot at night.  I do not get any clear-  cut history of rest pain or nonhealing ulcers.  She has had no  significant symptoms on the left side.  She has had specifically no hip,  thigh or calf claudication.  Her symptoms have gradually progressed over  the last 2 months.  She cannot remember any trauma to her foot.  She has  had no significant swelling.   PAST MEDICAL HISTORY:  Significant for hypertension and  hypercholesterolemia.  She denies any history of diabetes, history of  previous myocardial infarction, history of congestive heart failure or  history of COPD.   FAMILY HISTORY:  She is unaware of any history of premature  cardiovascular disease.   SOCIAL HISTORY:  She is single.  She has seven children.  She smoked a  pack per day of cigarettes for 40 years.   REVIEW OF SYSTEMS AND MEDICATIONS:  Are documented on the medical  history form in the chart.   PHYSICAL EXAMINATION:  General:  This is a pleasant 60 year old woman  who appears her stated age.  Her blood pressure 107/65, heart rate is  89.  HEENT:  Unremarkable.  Neck:  Supple.  There is no cervical  lymphadenopathy.  I do not detect any carotid bruits.  Lungs:  Clear  bilaterally to auscultation.  Cardiac:  She has a regular rate and  rhythm.  Abdomen:  Soft and nontender.  I cannot palpate an aneurysm.  She has palpable femoral popliteal and posterior tibial pulses  bilaterally.  I cannot palpate dorsalis pedis pulses.  Both feet appear  warm and well-perfused.  There is no evidence of atheroembolic disease.  There is no significant lower extremity swelling.  Neurologic:  Exam is  nonfocal.   She did have a Doppler study in our office on 09/05/2008 which showed  triphasic waveforms in the dorsalis pedis and posterior tibial positions  bilaterally.  ABIs were normal at 100% at rest.  With exercise there was  a slight drop in her ankle pressures bilaterally which quickly returned  to baseline.  Based on her Doppler study she could potentially have some  mild iliac disease which would explain the slight drop in pressure with  exercise in her ABIs.  However, this would present typically as hip or  thigh claudication and I get no history of this.  This would not present  with foot pain unless she had evidence of atheroembolic disease.  However, there is no evidence of this on exam.  She has a normal ABI at  rest and a palpable posterior tibial pulse and I do not think that her  pain in the right foot is from ischemia.  Other potential sources could  be an injury that she is not aware of versus arthritis or gout although  gout would be less likely.  I really do not think arteriography is  indicated as I am not convinced her convinced her symptoms are related  to ischemia and she has palpable pedal pulses.  If she ever developed  any significant discoloration to suggest atheroembolic disease then  aortogram would be helpful to look for any potential source of embolus.  However, currently on exam there is no evidence of this.  I will be  happy to see her back at any time if any new vascular issues arise.   Di Kindle. Edilia Bo, M.D.  Electronically Signed  CSD/MEDQ  D:  09/26/2008  T:  09/27/2008  Job:  [id number]   cc:   Lonie Peak

## 2011-02-11 NOTE — Discharge Summary (Signed)
Crystal [name], DAVISSON                 ACCOUNT NO.:  [id number]   MEDICAL RECORD NO.:  [id number]          PATIENT TYPE:  INP   LOCATION:  3729                         FACILITY:  [facility]   PHYSICIAN:  Francisca December, M.D.  DATE OF BIRTH:  03-26-1951   DATE OF ADMISSION:  06/18/2008  DATE OF DISCHARGE:  06/20/2008                               DISCHARGE SUMMARY   DISCHARGE DIAGNOSES:  1. Chest pain, microvascular angina exacerbated by tobacco abuse.  2. Tobacco abuse, smoking cessation counseling.  3. Known coronary artery disease, history of stenting of the right      coronary artery and rotablator of the diagonal branch in the past      and ultimately in 2002, she had a single vessel RIMA to the right      coronary artery.  4. History of right superficial femoral atherectomy and vein      angioplasty in 1999 for possible embolism.  5. Nephrolithiasis.  6. History of transvaginal hysterectomy and bilateral salpingo-      oophorectomy.  7. Rectal fistula repair.  8. Reported previous cardiomyopathy that had resolved.  9. Irritable bowel disease.  10.Dyslipidemia.  11.Chronic depression.  12.Frary-term medication use.  13.Allergy to PENICILLIN.   HOSPITAL COURSE:  [name] is a 60 year old female with the above-  mentioned coronary artery disease.  On the date of admission, she  developed right jaw pain, which was different from previous chest pains  that she had had in the past.  Because of her history, we admitted her  to the hospital.  She was known also to have potassium on admission of  2.9.  This was repleted.  She was started on IV heparin and IV  nitroglycerin and ultimately, she underwent cardiac catheterization.   Her cardiac catheterization essentially showed no obstructive disease  and her discomfort was felt to be microvascular in nature.  She remained  in the hospital overnight and on June 20, 2008, she was ready for  discharge to home.   Lab studies during her  hospital stay include cardiac isoenzymes, which  were negative.  Hemoglobin 11.1, hematocrit 32.3, white count 5.5,  platelets 186.  Folate grater than 20.  Vitamin B12 greater than 2000.  Sodium 138, potassium 3.5 (upon repletion), BUN 8, creatinine 0.85.  Chest x-ray, COPD/emphysema.  No acute chest findings.  EKG, normal  sinus rhythm, rate of 66 with nonspecific ST-T wave changes diffusely.   DISCHARGE INSTRUCTIONS:  Follow up with Dr. Amil Amen on October 24, 2008  at 1:30 p.m.  She is instructed to stop smoking.  She is instructed to  call for a Chantix prescription.   She is to continue the following medications:  1. Allegra 180 mg a day.  2. Enteric-coated aspirin 325 mg a day.  3. Ativan 0.5 mg p.r.n.  4. Cymbalta 120 mg a day.  5. Folic acid 1 mg a day.  6. Mobic 15 mg a day.  7. Multivitamin daily.  8. Niaspan 1000 mg nightly.  9. Norvasc 5 mg a day.  10.Plavix 75 mg a day.  11.Pletal 50  mg twice a day.  12.Prilosec 20 mg twice a day.  13.Isosorbide dinitrate 20 mg p.o. b.i.d.   [name] is discharged to home in stable and improved condition.      Guy Franco, P.A.      Francisca December, M.D.  Electronically Signed    LB/MEDQ  D:  06/20/2008  T:  06/21/2008  Job:  [id number]   cc:   Francisca December, M.D.  Harrold Donath PA Anna Genre

## 2011-02-11 NOTE — H&P (Signed)
NAMEMIDA, CORY                 ACCOUNT NO.:  [id number]   MEDICAL RECORD NO.:  [id number]          PATIENT TYPE:  INP   LOCATION:  3714                         FACILITY:  [facility]   PHYSICIAN:  Nestor Ramp, MD        DATE OF BIRTH:  [birth year]   DATE OF ADMISSION:  01/24/2009  DATE OF DISCHARGE:                              HISTORY & PHYSICAL   PRIMARY CARE PHYSICIANS:  1. Francisca December, MD of Cardiology.  2. Di Kindle. Edilia Bo, MD, Vascular.   CHIEF COMPLAINT:  Right facial weakness, slurred speech, and dilated  right pupil.   HISTORY OF PRESENT ILLNESS:  This is a 60 year old female with past  medical history significant for CAD, status post CABG; PVD, status post  right superficial femoral atherectomy; hypertension; and hyperlipidemia  who presents with complaints of right facial weakness, slurred speech,  dilated right pupil intermittently x2 days.  Also notes right leg  weakness and right arm weakness.  All symptoms have now resolved except  for right pupil dilation.  The patient had nausea and emesis today,  nonbloody which has now resolved.  Also reports some blurred vision as  well as headache today, which have now resolved.  The patient states  that she has been using a dog eye medication for the past few days to  relieve styes in the right eye.  She is unsure what the name of this  medication is.  The patient denies previous eye surgery or other eye  medications at this time.  Also reports 2 weeks ago, history of right  leg swelling and pain x3 days, which has not returned and is now  resolved.   PAST MEDICAL HISTORY:  1. CAD with history of stent, CABG of right coronary artery in 2002.  2. Right superficial femoral atherectomy in 1999.  3. Peripheral vascular disease.  4. History of nephrolithiasis.  5. IBS.  6. Hyperlipidemia.  7. Depression.  8. Hypertension.  9. GERD.   Note that the patient has not had stroke or TIA symptoms in the past.   PAST  SURGICAL HISTORY:  1. CABG of right coronary artery in 2002.  2. Transvaginal hysterectomy/BSO.  3. Rectal fistula repair.  4. Right superficial femoral atherectomy in 1999.   ALLERGIES:  1. PENICILLIN, rash.  2. SULFA, BACTRIM, nausea.   MEDICATIONS:  1. Allegra 180 mg p.o. daily.  2. Aspirin 325 mg p.o. daily.  3. Ativan 0.5 mg p.o. p.r.n. anxiety.  4. Cymbalta 120 mg p.o. daily.  5. Folic acid 1 mg p.o. daily.  6. Mobic 15 mg daily.  7. Multivitamin.  8. Niaspan 1000 mg p.o. nightly.  9. Norvasc 5 mg p.o. daily.  10.Plavix 75 mg p.o. daily.  11.Pletal 50 mg p.o. b.i.d.  12.Prilosec 20 mg p.o. b.i.d.  13.Imdur 20 mg p.o. b.i.d.   SOCIAL HISTORY:  The patient lives with son.  He is retired from working  in the school system.  Smokes one pack per day for over 20 years.  Denies alcohol or illicit drug use.   FAMILY HISTORY:  1. Mother deceased of MI in 3s.  2. Father with history of ulcers, deceased from pneumonia at around      age 46.  3. Nine siblings, brothers with PVD and hypertension.  No family      history of diabetes.   REVIEW OF SYSTEMS:  1. Positive as per HPI as well as for chest pain, which is the      patient's baseline angina.  2. Dyspnea which is baseline for the patient.  The patient does not      have O2 requirement at home.  3. Abdominal pain with the patient's IBS.   Review of systems is negative for fevers, chills, sweats, appetite  change, weight change, worsening cough, hematemesis, bright red blood  per rectum, melena, hematuria, dysuria, incontinence, dizziness,  vertigo, polyuria, or polydipsia.   CODE STATUS:  Full code.   PHYSICAL EXAMINATION:  VITAL SIGNS:  Temperature 98.1 to 98.4, pulse 64  to 72, respiratory rate 18, blood pressure 100 to 120 over 62 to 72, O2  saturation 94% on room air.  GENERAL:  NAD.  Pleasant female.  HEENT:  NCAT, EOMI, right anisocoria fixed and dilated pupil with left  pupil reactive to direct and  consensual stimulation.  Vision grossly  intact.  Moist mucous membranes.  NECK:  No cervical lymphadenopathy.  CARDIOVASCULAR:  Regular rate and rhythm.  No murmurs, rubs, or gallops.  2+ radial pulses, 1+ pedal pulses bilaterally.  No carotid bruits.  No  JVD.  LUNGS:  Clear to auscultation bilaterally with normal work of breathing.  ABDOMEN:  Soft, mildly tender right upper and lower quadrants, and  nondistended.  Positive bowel sounds.  BACK:  Nontender.  EXTREMITIES:  No edema.  NEUROLOGIC:  Cranial nerves II-XII intact except as above for eye exam,  cerebellar intact.  No Romberg, no pronator drift.  Alert and oriented  x3.  Brisk reflexes symmetric throughout with 1-beat clonus bilaterally  at the ankles.  MUSCULOSKELETAL:  5/5 strength in bilateral upper and lower extremities.   LABORATORIES AND STUDIES:  White blood cell count 7.7, hemoglobin 14.6,  hematocrit 41.0, platelets 222.  Sodium 140, potassium 2.9, chloride  105, BUN 4, creatinine 0.9, glucose 128.  MRI/MRA head and neck  negative.  Fasting lipid panel from June 18, 2008, with total  cholesterol 124, triglycerides 116, HDL 42, LDL 59.   ASSESSMENT AND PLAN:  This is a 60 year old female with past medical  history significant for coronary artery disease, status post coronary  artery bypass graft, hypertension, and peripheral vascular disease who  presents with symptoms concerning for transient ischemic attack.  1. Transient ischemic attack symptoms.  The patient's ABCD squared      score equals 4.  This places her at moderate risk category.  We      will check 2D echo, A1c, TSH, and EKG.  Risk factors include      tobacco abuse, coronary artery disease, peripheral vascular      disease, hyperlipidemia, and hypertension.  We will recheck CBC and      BMET in the morning.  We will check coags.  We will check bedside      swallow study prior to starting diet.  The patient without      neurological deficits on  exam except for right anisocoria as above.      We will determine exactly what medication the patient has been      using in right eye to determine if this  could be cause, however,      unlikely given the patient's other presenting symptoms, we will      consult Neurology and we will ask specifically regarding      anticoagulation in this patient.  We will obtain cardiac enzymes x2      sets, place on tele bed.  We will do q.3 h. neuro check at this      point.  We will hold on Norvasc for now as the patient normotensive      and would not want to prevent hypoperfusion in this patient with      transient ischemic attack symptoms.  2. Hypertension, normotensive currently.  We will hold Norvasc to      prevent hypoperfusion as above with possible transient ischemic      attack symptoms.  3. Coronary artery disease/peripheral vascular disease.  The patient      had followup arranged with vascular on January 26, 2009.  We will      continue home medications aspirin, Plavix, and Pletal at this time.  4. Hyperlipidemia.  Continue Niaspan.  Last fasting lipid panel as      above within normal limits.  We will not recheck one at this time.  5. Fluids, electrolytes, nutrition/gastrointestinal, pending bedside      swallow.  If pass, would advance to heart-healthy diet.  We will      give you IV at this time.  We will replete potassium.  6. Depression, Cymbalta, stable.  7. Gastroesophageal reflux disease.  The patient on Prilosec at home.      We will start Protonix.  8. Irritable bowel syndrome, stable.  We will continue to follow.  9. Code status, full code.   DISPOSITION:  Pending TIA workup, pending Neurology eval and  recommendations in the morning.      Bobby Rumpf, MD  Electronically Signed      Nestor Ramp, MD  Electronically Signed    KC/MEDQ  D:  01/24/2009  T:  01/25/2009  Job:  [id number]

## 2011-02-11 NOTE — H&P (Signed)
Crystal [name], Crystal [name]                 ACCOUNT NO.:  [id number]   MEDICAL RECORD NO.:  [id number]          PATIENT TYPE:  EMS   LOCATION:  MAJO                         FACILITY:  [facility]   PHYSICIAN:  Georga Hacking, M.D.DATE OF BIRTH:  Jan 29, 1951   DATE OF ADMISSION:  06/18/2008  DATE OF DISCHARGE:                              HISTORY & PHYSICAL   REASON FOR ADMISSION:  Jaw pain.   HISTORY:  [age]-year-old female with a complicated cardiac  history.  She has a Bench history of atherosclerotic peripheral vascular  disease, tobacco abuse, hypertension and coronary disease.  In 1999 she  had stenting of the right coronary and a Rotablator of the diagonal  branch by Dr. Amil Amen.  She has a previous history of a cardiomyopathy  which is resolved.  She developed in-stent restenosis and after multiple  attempts to keep his open, which were unsuccessful, culminated in a  single vessel in the right internal mammary graft to the right coronary  artery by Dr. Laneta Simmers in April of 2002.  She, because of recurrent pain  and multiple attempts to manage this, was ultimately found to have  occlusion of the mammary graft.  In May of 2005 she was seen by Dr.  Adrian Saran and had Cypher  3.5 x 23 mm drug-eluting stent placed in the  right coronary artery.  She was discharged from [facility] and was  readmitted to [facility] at that time and later discharged by Dr. Amil Amen.  She has continued to use tobacco and had complained of claudication.  A  peripheral angiogram on her showed patency of her iliacs without  significant stenosis and thought to have somewhat small distal leg  vessels.  She has had worsening chest pain recently and reported a  stress test recently in Dr. Amil Amen' office, and according to the  daughters, is set up for a catheterization.  The chest pain is chronic  and has been present for some years.   She developed jaw pain this morning described as right jaw pain, which  was different from  previous chest pain that she had.  She presented to  the emergency room where initial enzymes and an EKG were negative.  She  has continued to have ongoing jaw pain, I was asked to see her and we  decided to admit her to rule out myocardial infarction or atypical  angina.   Her past history is complex.  She has a history of chronic depression  and anxiety, and is under treatment.  She is on two anxiolytics that she  receives from two different physicians.  She has a history of  hypertension, diabetes, ongoing cigarette smoking, peripheral vascular  disease, previous cardiomyopathy that is resolved, previous history of  irritable bowel disease, history of kidney stones, dyslipidemia,  claudication, ongoing tobacco abuse.  She is hypokalemic currently on  admission.   PAST SURGICAL HISTORY:  Right superficial femoral arterectomy and vein  angioplasty on November 03, 1997 for possible embolus.  She has had  extraction of kidney stones previously and also a single-vessel coronary  artery bypass graft by Dr.  Evelene Croon in 2002.  She has had a previous  hysterectomy and repair of pelvic relaxation.  She has had bilateral  salpingo-oophorectomy.  She has had repair of rectal fistula in the  past.   ALLERGIES:  PENICILLIN.   CURRENT MEDICATIONS:  Include:  1. Allegra 180 mg daily.  2. Aspirin 325 mg daily.  3. Ativan  0.5 mg p.r.n.  4. Cymbalta 120 mg daily.  5. Folic acid 1 mg daily.  6. Mobic 15 mg daily.  7. Multivitamins daily.  8. Niaspan 1000 mg nightly.  9. Amlodipine 5 mg daily.  10.Plavix 75 mg daily.  11.Pletal 50 mg b.i.d.  12.Prilosec 20 mg b.i.d.  13.Rozerem 8 mg as needed.  14.Toprol XL 25 mg daily.  15.Vitamin B12 one tablet daily.  16.Xanax 0.5 mg p.r.n.   SOCIAL HISTORY:  She is divorced, has seven children, currently lives  alone, and is on disability.  She had been under significant situational  stress with several sons in the [employer].  One daughter who is  pregnant  at the present time.  Smokes between 1/2 and 1pack of cigarettes per day  for years.  No alcohol.  Currently lives alone.   FAMILY HISTORY:  Recorded in old charts reviewed and unchanged.   REVIEW OF SYSTEMS:  Prior history of depression, severe anxiety.  She  has had dizzy spells as well as mild reduction in her vision.  She has a  history of irritable bowel syndrome and inflammatory bowel disease in  the past, but states that she does  not currently have any active GI  issues.  She has a history of claudication on walking several blocks.  She has mild dyspnea with exertion.  She has some chronic pain syndrome  for which she takes Mobic and has had diffuse arthritis.  Other than as  noted above, the remainder of the review of systems is unremarkable.   EXAMINATION:  She is a middle-aged female appearing older than stated  age, and appears depressed.  Blood pressure is 116/70 sitting, pulse is  80 and regular.  SKIN:  Is warm and dry.  ENT:  EOMI.  PERRLA.  Fundi not examined.  Pharynx negative.  NECK:  Supple.  No masses, no JVD or thyromegaly.  Bilateral carotid  bruits were noted.  LUNGS:  Were clear bilaterally.  CARDIOVASCULAR:  Exam showed normal S1, S2.  There is no S3 or murmur.  ABDOMEN:  Soft and nontender.  There are bilateral femoral bruits noted.  Peripheral pulses are 1+ on  the right and 1/2 to 1+ on the left.  There is no peripheral edema  noted.  NEUROLOGIC:  Normal.   EKG shows minor nonspecific changes in the anterolateral chest leads.  Chest x-ray shows mild cardiomegaly.   LABORATORY DATA:  Shows an MCV of 106, potassium is 2.9.  Renal function  was normal.  CBC was normal.   IMPRESSION:  1. Atypical jaw pain with unremarkable EKG and initial negative      enzymes.  2. Recent chronic chest pain syndrome which was worsening.  3. Coronary artery disease with multiple complex procedures involving      the right coronary artery.  Previous Rotablator  of diagonal branch.  4. Hypertension.  5. Hyperlipidemia under treatment.  6. Depression, anxiety.  7. Hypokalemia.  8. Chronic pain syndrome.  9. Macrocytosis.   RECOMMENDATIONS:  1. Replete potassium.  2. Begin heparin and IV nitroglycerin.  In view of the complex cardiac  history, we will make arrangements for her to proceed with cardiac      catheterization by Dr. Amil Amen.  Keep n.p.o. after midnight.  3. Rule out myocardial infarction with serial enzymes.      Georga Hacking, M.D.  Electronically Signed     WST/MEDQ  D:  06/18/2008  T:  06/18/2008  Job:  [id number]   cc:   Francisca December, M.D.  Lonie Peak, P.A.

## 2011-02-14 NOTE — Cardiovascular Report (Signed)
[facility]  Patient:    Crystal [name], NICASTRO                        MRN: [id number] Adm. Date:  [id number] Attending:  Corliss Marcus CC:         Francisca December, M.D.   Cardiac Catheterization  PROCEDURE PERFORMED:  Left heart catheterization, coronary angiography, single plane ventriculogram.  INDICATION FOR PROCEDURE:  Silvia Birdsell is a 60 year old female with known coronary artery disease.  Initial intervention was a PTCA of the second diagonal.  This was followed by a PTCA and stent of the right coronary artery proximally.  She later returned and had a stent placed in the ostium of the right coronary artery.  Myocardial perfusion study using adenosine stress Cardiolite performed in July 2001 had revealed no significant ischemia; however, the patient persisted in having atypical angina which led to the placement of the stent in the ostium of the right coronary artery.  Ms. Ferrucci continues to smoke.  Presented this admission with dizziness, chest pain which has been intermittent with radiation to her jaw and mid back.  Myocardial infarction was eliminated via serial cardiac enzymes.  DESCRIPTION OF PROCEDURE:  After obtaining written informed consent, the patient was brought to the cardiac catheterization lab in the postabsorptive state.  Preoperative sedation was achieved using IV Versed.  A 6-French hemostasis sheath was placed in the right femoral artery using the modified Seldinger technique approach.  Single plane ventriculogram was performed in the RAO position using a 6-French pigtail curved catheter.  Selective coronary angiography was performed using a #4 left Judkins catheter.  The 5-French Judkins had significant damping with engagement.  This was felt to be secondary to side wall pressure.  The left main was viewed in multiple views. Difficulty in engaging the right coronary artery secondary to the stent which was protruding into the aorta.   Successful engagement was made using a 6-French JR3.5 guiding catheter.  Following multiple views, it was noted significant in-stent stenosis of the ostium of the right coronary artery.  FINDINGS:  AO pressure was 107/53.  LV pressure was 104/15.  There was no gradient noted on pullback.  Single plane ventriculogram revealed normal wall motion.  The ejection fraction was estimated at 65%.  There was post PVC MR only.  No valvular calcification or coronary artery calcification was noted.  Coronary arteriography: 1. The left main was short and bifurcated into the left anterior descending    and circumflex vessel.  There was no identifiable disease in the left main    coronary artery. 2. The left anterior descending was a small vessel.  The site of the previous    PTCA remained patent.  The ongoing LAD was a transapical vessel.  No    critical stenosis was noted. 3. The circumflex vessel tapered proximally.  There was a 20-30% ostial    stenosis noted in the left circumflex.  The circumflex gives rise to a    large obtuse marginal which covers the lateral surface of the LV. 4. The right coronary artery is dominant for the posterior circulation.  The    previously placed stent in the right coronary artery extrudes into the    aorta.  This made engagement difficult.  There was approximately an 80%    in-stent stenosis noted in the right coronary artery.  IMPRESSION: 1. In-stent stenosis of the proximal right coronary artery. 2. Previously angioplastied diagonal  branch remains patent. 3. Preserved LV function with an ejection fraction of 65%.  The films will be reviewed with Dr. Amil Amen.  Further recommendations pending this review. DD:  01/15/01 TD:  01/17/01 Job: [id number]

## 2011-02-14 NOTE — Cardiovascular Report (Signed)
. [facility]  Patient:    Crystal [name], Crystal [name]                        MRN: [id number] Proc. Date: 08/17/00 Adm. Date:  [id number] Attending:  Corliss Marcus CC:         [facility]  Cardiac Catheterization Laboratory   Cardiac Catheterization  PROCEDURES PERFORMED: 1. Left heart catheterization. 2. Coronary angiography. 3. Left ventriculogram. 4. Percutaneous transluminal coronary angioplasty (cutting balloon)/stent)    proximal and ostial right coronary artery. 5. Intravascular ultrasound.  INDICATIONS:  Crystal [name] is a 60 year old woman who has known ASCVD and is 2-1/2 years status post PTCA of the second diagonal and PTCA/stent of the right coronary proximally.  She has had recurrent atypical angina present now for about three months.  A myocardial perfusion study using adenosine stress performed in July 2001 showed no significant ischemia.  However, the patient has persisted having atypical angina.  She is returned to the catheterization lab at this time to identify possible progressive or possible recurrent disease and provide preferred therapeutic options.  PROCEDURAL NOTE:  Left heart catheterization was performed following percutaneous insertion of a 5-French catheter sheath utilizing an anterior approach over a guiding J-wire into the right femoral artery.  A 110 cm pigtail catheter was then used to measure pressures in the ascending aorta and in the left ventricle as well as perform left ventriculography in a 30 degree RAO angulation.  Left coronary angiography was then performed using a 5-French #4 left Judkins catheter.  The 5-French Judkins would "intubate the right coronary deeply" causing pressure damping and I was therefore not successful with this device.  Therefore, a 5-French catheter sheath was exchanged over a Viramontes guiding J-wire for a 6-French catheter sheath.  The 6-French right #4 Judkins catheter was advanced to  the ascending aorta and right coronary artery angiography was performed in an RAO angulation.  This clearly showed ostial and proximal in-stent restenosis.  We then proceeded with the intervention.  The 6-French catheter sheath was exchanged over a Opheim guiding J-wire for a 7-French catheter sheath.  The 6-French catheter sheath was inserted percutaneously into the right femoral vein utilizing an anterior approach over a guiding J-wire for intravenous access.  The patient then received 4000 units of heparin intravenously as well as a double bolus of Integrilin and constant infusion.  Subsequent ACT was 234 seconds.  A 7-French FR 3.5 Scimed Wise guiding catheter was advanced to the ascending aorta where the right coronary os was engaged partially.  A 0.014 inch Scimed/Luque intracoronary guide wire was advanced across the lesion without difficulty.  A 3.25/15 mm [employer] cutting balloon was passed into the proximal right coronary artery. It was inflated on six occasions.  Peak atmospheric pressure was 8. Peak duration was 114 seconds.  These maneuvers resulted in a 30-40% ostial stenosis persisting.  Therefore, a 3.5/9 mm Scimed near [employer] intracoronary stent was passed into the proximal portion of the artery. Approximately 6 mm of the stent was overlapping with the previously placed. There was good coverage of the ostium.  It was ultimately deployed at 16 atmospheres for 65 seconds.  We then proceeded within intravascular ultrasound.  A Scimed Atlantis ultrasound catheter was advanced over the guide wire and into the proximal right coronary.  It was withdrawn through the stent and into the guiding catheter with a mechanical automatic pullback device.  The images were analyzed.  Repeat angiography performed with and without the guide wire in place and then the guiding catheter was removed.  Repeat ACT was 226 seconds The patient was transported to the recovery area in  stable condition with intact distal pulse.  HEMODYNAMICS:  Systemic arterial pressure was 112/62 with a mean of 83 mmHg. There was no systolic gradient across the aortic valve.  The left ventricular end-diastolic pressure was 15 mmHg, ventriculogram and unchanged post-ventriculogram.  ANGIOGRAPHY:  VENTRICULOGRAPHY:  The left ventriculogram demonstrated intact global left ventricular size and systolic function with no significant regional wall motion abnormality.  The calculated ejection fraction utilizing a single plane cine method was 64%.  There was no mitral regurgitation.  There was no valvular calcification or coronary artery calcification seen.  CORONARY ARTERIOGRAPHY:  There was a right dominant coronary system present.  1.  LEFT MAIN:  The left main coronary artery was short and normal.  2.  LEFT ANTERIOR DESCENDING:  The left anterior descending artery and its     branches were minimally disease; there were luminal irregularities     throughout the course of this vessel.  At the site of previous PTCIA in     the diagonal branch there is a 35% residual stenosis at the ostium.  The     ongoing LAD was large transapical and diseased.  3.  LEFT CIRCUMFLEX:  The left circumflex tapered proximally where there was a     20% ostial stenosis.  It then gave rise to a single branching marginal     which covered the entire lateral surface of the LV toward the obtuse     margin.  There was a 30% stenosis at the origin of this large marginal.  4.  RIGHT CORONARY ARTERY:  The right coronary artery and its branches were     highly diseased; this vessel had a proximal and ostial stenosis at 95%.     The ongoing vessel had luminal irregularities but no significant     obstructions seen.  A moderate sized posterior descending artery, moderate     posterolateral segment and small posterolateral branch are seen to arise     without any significant obstructions.  Following balloon  dilatation and stent implantation, there was no residual  stenosis in the ostium of the right coronary artery.  INTRAVASCULAR ULTRASOUND:  The stented segment at the ostium resulted in a 3.2 x 3.2 mm diameter.  There was an ostial square area of 7.9 mm squared.  This results in an anticipated restenosis rate of 21%.  FINAL IMPRESSION: 1. Atherosclerotic coronary vascular disease single vessel. 2. Status post successful PTCA (cutting balloon) and stent implantation    proximal and ostial right coronary artery. 3. Typical angina was reproduced with device insertion and balloon inflation. 4. Intact left ventricular size and systolic function which represents a    significant improvement since that obtained by angiography in January 1999. D:  08/17/00 TD:  08/18/00 Job: [id number]

## 2011-02-14 NOTE — H&P (Signed)
NAME:  Crystal [name], Crystal [name]                           ACCOUNT NO.:  [id number]   MEDICAL RECORD NO.:  [id number]                   PATIENT TYPE:  INP   LOCATION:  NA                                   FACILITY:  [facility]   PHYSICIAN:  Malachi Pro. Ambrose Mantle, M.D.              DATE OF BIRTH:  [birth year]   DATE OF ADMISSION:  12/05/2003  DATE OF DISCHARGE:                                HISTORY & PHYSICAL   HISTORY OF PRESENT ILLNESS:  A 60 year old white female, para 5-1-0-6,  admitted to the hospital for vaginal hysterectomy, A&[name] repair, and attempt  at strengthening her anal sphincter because of symptomatic cystocele and  fecal incontinence.  This patient had not been seen in our office in many  years until July 2004, when she came complaining of poor control of her  stool, waking up with stool in her panties, and a bulge in her vagina.  She  did have a fourth degree cystocele and poor sphincter tone.  The sphincter  was quite thin.  She was referred to Dr. Kendrick Ranch for evaluation.  He  evaluated her and had her seen by gastroenterology, who according to the  patient, found no abnormality on colonoscopy.  She did not receive any  significant help with her 8 to 10 bowel movements a day, but they do improve  with Imodium.  Colon biopsies were negative.  Because of health problems,  Dr. Earlene Plater could not do the surgery right away, so we had scheduled it for  today and now because of a family death he is unable to perform this surgery  and neither of his associates is able to perform the surgery.  I have  explained to the patient that I have done many sphincter repairs after  delivery, that the procedure is the same it is just a different setting.  I  gave her four options, to cancel the surgery altogether, repeat it later, to  do the vaginal hysterectomy A&[name] repair now and a sphincter repair later, to  have one of Dr. Earlene Plater associates do the sphincter repair at this time which  is not a possibility, and  that I would proceed with the surgery.  She wants  without question to proceed with the surgery today.   ALLERGIES:  PENICILLIN, caused rash and hives.   PAST MEDICAL HISTORY:  1. She had what she states is a clot to her right big toe.  2. She also had coronary artery disease which eventuated in a coronary     artery bypass surgery in 2002.   PAST SURGICAL HISTORY:  1. Coronary artery bypass graft in 2002.  2. Tubal ligation in 1985.  3. Some type of vascular procedure on her leg.   MEDICATIONS:  1. Zoloft.  2. Prilosec.  3. Folic acid.  4. Toprol.  5. Lipitor.  6. Aspirin.  7. Vitamins.  8. Norvasc.  9. Furosemide.  10.      Nitro-Quick.  11.      Nitroglycerin.   SOCIAL HISTORY:  The patient does not drink.  She smokes one pack of  cigarettes a day.   FAMILY HISTORY:  Mother died at 68 of heart problems.  Father died at 24 of  pneumonia.  One sister is living and well.  One sister died of a fire.  Three brothers are living and well.   REVIEW OF SYSTEMS:  The patient has some dizziness, also has some shortness  of breath.  She has been evaluated by Dr. Amil Amen recently with a coronary  catheterization and was found to have single vessel disease 80% of the right  coronary artery, and atypical angina.  He feels there is no contraindication  to the planned surgery, and I have spoken to him personally about this.   PHYSICAL EXAMINATION:  GENERAL:  A well-developed, obese, white female in no  distress.  VITAL SIGNS:  Weight is 143 pounds.  Pulse is 80, blood pressure is 150/80,  respirations 16.  HEENT:  No cranial abnormalities.  Extraocular movements were intact.  Nose  and pharynx are clear.  There is an upper dental plate.  There is a lower  partial.  NECK:  Supple without thyromegaly.  HEART:  Normal size and sounds, no murmurs.  There is a midline scar over  her sternum.  LUNGS:  Clear to [name]&A.  BREASTS:  Soft without masses.  ABDOMEN:  Soft, nontender.  There  are no masses palpable.  PELVIC:  Vulva and vagina are clean.  There is thin tissue anterior to the  anus with a small amount of sphincter in it.  The Bartholin, urethra, and  skin glands are negative.  The cervix is clean.  Uterus is anterior, small.  The adnexa are free of masses.  RECTAL:  Poor sphincter tone.  It is thin, but there is some sphincter  present.   Pap smear on March 30, 2003, was within normal limits.  Further description of  the vagina reveals a fourth degree cystocele and moderate descensus of the  cervix.   ADMISSION IMPRESSION:  Symptomatic pelvic relaxation with insufficient  sphincter anterior to the rectum probably resulting from previous child  births.   PLAN:  The patient is admitted for a vaginal hysterectomy, A&[name] repair, and  attempted sphincteroplasty.  She has been informed of the risks of surgery,  including, but not limited to heart attack, stroke, pulmonary embolus, wound  disruption, hemorrhage with need for re-operation and/or transfusion,  fistula formation, nerve injury, intestinal obstruction.  She understands  and agrees to proceed.  She also understands that the sphincter surgery may  not be of any significant benefit.                                               Malachi Pro. Ambrose Mantle, M.D.    TFH/MEDQ  D:  12/05/2003  T:  12/05/2003  Job:  [id number]

## 2011-02-14 NOTE — Op Note (Signed)
NAME:  Crystal [name], Crystal [name]                           ACCOUNT NO.:  [id number]   MEDICAL RECORD NO.:  [id number]                   PATIENT TYPE:  INP   LOCATION:  0347                                 FACILITY:  [facility]   PHYSICIAN:  Malachi Pro. Ambrose Mantle, M.D.              DATE OF BIRTH:  05-Apr-1951   DATE OF PROCEDURE:  12/05/2003  DATE OF DISCHARGE:                                 OPERATIVE REPORT   PREOPERATIVE DIAGNOSES:  1. Pelvic relaxation with uterine prolapse.  2. Fourth-degree cystocele.  3. Fecal incontinence.  4. Poor rectal sphincter.   POSTOPERATIVE DIAGNOSES:  1. Pelvic relaxation with uterine prolapse.  2. Fourth-degree cystocele.  3. Fecal incontinence.  4. Poor rectal sphincter.   OPERATION/PROCEDURE:  1. Vaginal hysterectomy.  2. Anterior and posterior repair.  3. Repair of the rectal sphincter.   SURGEON:  Malachi Pro. Ambrose Mantle, M.D.   ASSISTANT:  Huel Cote, M.D.   ANESTHESIA:  General.   DESCRIPTION OF PROCEDURE:  The patient was brought to the operating room and  placed under satisfactory general anesthesia, placed in the lithotomy  position.  The vulva, vagina, perineum, and urethra were prepped with  Betadine solution.  A Foley catheter was inserted to straight drain.  Examination revealed a large cystocele, moderate descensus of the cervix,  smaller rectocele and some prolapse of the anal mucosa, and very little  anterior rectal sphincter.  The patient was then draped as a sterile field.  The cervix was exposed, drawn into the operative field and the  cervicovaginal junction was injected with a dilute solution of Neo-  Synephrine.  About 12-15 mL total solution was used.  A circumferential  incision was made around the cervix at the cervicovaginal junction.  Anterior vaginal mucosa was then pushed anteriorly.  The posterior cul-de-  sac was entered by sharp dissection.  Both uterosacral ligaments were  clamped, cut and suture ligated and held.  Cardinal  ligaments and parametria  were clamped, cut and suture ligated.  We got almost to the upper pedicles  and attempted to invert the uterus through the incision of the cul-de-sac  but it was still held securely by the upper pedicles, so I discontinued to  go up the upper pedicles, up the broad ligament until I did reach the upper  pedicles at which point, I inverted the uterus through the incision of the  cul-de-sac and removed the uterus by transecting the upper pedicles.  The  upper pedicles were doubly suture ligated and held.  Hemostasis appeared  adequate except along the vaginal cuff and I stopped this bleeding with a  running locked suture of 0 Vicryl across the vaginal cuff.  I could feel the  right ovary.  I could not get to the left ovary.  The right ovary felt  normal in size.  I placed a pursestring suture of #1 Vicryl around the  pelvic peritoneum, starting anteriorly,  going through the left upper  pedicle, the left uterosacral ligament, the posterior cul-de-sac, the right  uterosacral ligament, and right upper pedicle.  Before tying this down, I  placed a suture through the uterosacral ligaments, tied it down and then  closed the peritoneal cavity by tying down the pursestring suture through  the peritoneum.  I then dissected the anterior vaginal mucosa to just distal  to the urethral meatus, developed the cystocele, imbricated the cystocele  with several sutures of 0 Vicryl, cut away redundant vaginal mucosa and then  reunited the vagina, using interrupted figure-of-eight sutures of 0 Vicryl.  A couple of bleeders on the pubocervical fascia and bladder were sutured  with a 3-0 Vicryl.   I then turned my attention posteriorly and as noted previously, the anal  mucosa was somewhat prolapsed, prolapsed actually to a distance of only  about 1 cm from the vaginal mucosa.  There was basically no perineal body.  I incised the perineum in a transverse direction and then undermined  the  posterior vaginal mucosa to the cuff.  I did not put any sutures through the  fascia over the rectum, but I cut away redundant vaginal mucosa and then  reunited the vaginal mucosa in the midline with figure-of-eight sutures of 0  Vicryl.  Before I got to the perineum, I dissected along where I felt that  the rectal sphincter would have retracted to, felt some good tissue that by  grasping it with Allis clamps on both sides, I was able to confirm that this  tissue, in fact, did narrow the rectal orifice.  I placed three figure-of-  eight sutures of 0 Vicryl through the sphincter from the left to the right  side, and then after I had placed all three sutures, I did a rectal  examination and confirmed that those sutures were in the rectum and that no  injury had been done to the rectum.  I then tied all the sutures down after  confirming there was no injury and no suture in the rectum.  I then  completed the reapproximation of the vaginal mucosa and the reapproximation  of the skin of the perineum.  With the patient asleep, I was unable to see  exactly how much change the rectal sphincter has, but both Dr. Senaida Ores  and I did rectal exams.  When we grasped the tissue and that I used, we felt  that it definitely functioned as sphincter.   The procedure was terminated after a two-inch Iodoform pack was placed in  the vagina.  Blood loss was about 100 mL.  Sponge and needle counts were  correct.  She was returned to recovery in satisfactory condition.                                               Malachi Pro. Ambrose Mantle, M.D.    TFH/MEDQ  D:  12/05/2003  T:  12/05/2003  Job:  [id number]

## 2011-02-14 NOTE — H&P (Signed)
[place]  Patient:    Crystal [name], Crystal [name]                        MRN: [id number] Adm. Date:  [id number] Attending:  Corliss Marcus CC:         Francisca December, M.D.   History and Physical  REASON FOR ADMISSION:  Chest discomfort and dizziness.  HISTORY:  The patient is a 60 year old female, who has a complex cardiac history.  She has a history of atherosclerotic peripheral vascular disease. She has a history of tobacco abuse and hypertension.  In 1999, she had rotablator of a diagonal branch and stenting of the right coronary artery. She also had a history of a cardiomyopathy which had resolved by cath.  She had initial PTCA and stenting of the ostial right coronary artery January 26, 1998, and PTCA of the diagonal.  In November, she developed chest discomfort which ultimately resulted in a repeat catheterization showing restenosis of the right coronary artery ostium.  This was dilated with a 3.5 x 9 mm Elite stent with ultrasound showing good expansion.  She had mild disease involving the left anterior descending and circumflex and 30% marginal stenosis.  She presented with a two day history of dizziness as well as chest discomfort.  She described her initial feelings as dizziness and staggering which began yesterday associated with profound weakness and followed fatigue. She then began to have episodic anterior chest pain but also had mid back pain and some jaw pain.  The symptoms occurred intermittently since yesterday. Today she awoke, was feeling dizzy, staggering and felt like she could not walk, and she then called Dr. Amil Amen office this afternoon complaining of chest pain and was advised to nitroglycerin and then presented to the emergency room.  She was somewhat hypotensive on arrival and was given a fluid bolus.  She was given morphine.  When I talked to her, she was having some vague jaw pain.  It was hard to tell whether she was having chest  pain or not. Her EKG was unchanged.  She was admitted to evaluate for possible unstable pectoris in light of her cardiac history.  PAST MEDICAL HISTORY:  A previous hypertensive heart disease.  She has a previous idiopathic cardiomyopathy.  Depression.  Tobacco abuse. Gastroesophageal reflux disease.  Inflammatory bowel disease.  History of renal stones, and dyslipidemia.  She has a history of claudication.  She had a right superficial femoral endarterectomy and vein patch angioplasty on November 03, 1997, for possible embolus.  She has arterial brachial indices of greater than 1 bilaterally in August 2001.  She has had episodic chest pain for some time.  She had a profusion scan done last summer which was complicated by bronchospasm.  PAST SURGICAL HISTORY:  Vein patch angioplasty.  She has also had extraction of kidney stones previously.  ALLERGIES:  PENICILLIN.  CURRENT MEDICATIONS:  1. Aspirin.  2. BuSpar.  3. Lasix.  4. Zocor 60 mg daily.  5. Zoloft 100 mg daily.  6. Furosemide 20 mg daily.  7. Zestril 10 mg daily.  8. Toprol XL 50 mg daily.  9. Prilosec 20 mg daily. 10. Coated aspirin daily. 11. Calcium and vitamin D daily. 12. Dicyclomine 10 mg 4 tablets daily.  FAMILY HISTORY:  Recorded in old medical records, is reviewed, and is unchanged.  SOCIAL HISTORY:  She has smoked heavily for over 30 years.  She has seven children, ages 79  to 33, alive and well.  She is on disability.  She is divorced and separated separated since 1992.  REVIEW OF SYSTEMS:  Difficult due to the patients lethargy.  She has a prior history of depression and anxiety.  She has been clearly unable to stop smoking.  She has complained of some tunnel vision and some decreased vision since she has complained of dizziness.  She has no definite PND or orthopnea. She does have gastroesophageal reflux symptoms.  She has a prior history of inflammatory bowel disease and irritable bowel syndrome.  She  has a prior history of kidney stones, none recently.  She does not have any significant edema.  Does have claudication on walking several blocks.  The remainder of the review of systems is unremarkable except as noted above.  PHYSICAL EXAMINATION:  GENERAL:  She has [place], blonde hair and is somewhat lethargic.  VITAL SIGNS:  Blood pressure 90/60, pulse 60.  Skin is warm and dry.  HEENT:  EOMI.  PERRLA.  ______ and unremarkable.  NECK:  Soft, right carotid bruit.  LUNGS:  Clear.  CARDIAC:  Normal S1, S2, somewhat slow pulse rate.  There is no S3.  LYMPH NODES:  Unremarkable.  BREASTS:  Not examined.  ABDOMEN:  Soft, nontender, no mass.  Her pulses were present bilaterally.  2+ peripheral pulses were 1+.  There was no edema.  A 12-lead ECG shows ST depression in V2 and V3 which is unchanged from a previous EKG.  Hemoglobin was in the normal range.  IMPRESSION: 1. Chest discomfort with some atypical features in a patient with known    coronary disease, rule out unstable angina pectoris. 2. Coronary disease with:    a. Stent ostium of right coronary artery in 1999.    b. Cutting balloon and restenting of the ostium of the right coronary       artery in November 2001.    c. Previous angioplasty of diagonal in 1999 with good [place]-term result. 3. Hypertension, under treatment. 4. Cigarette abuse, ongoing. 5. History of depression and anxiety. 6. Dyslipidemia, under treatment. 7. History of kidney stones.  RECOMMENDATIONS:  Admit to telemetry unit.  Begin intravenous heparin. Because of hypotension, will hold nitroglycerin and continue ongoing medications.  Rule out MI with serial CPK and EKGs.  Morphine for pain. Likely will need repeat catheterization to evaluate last interventional procedure results. DD:  01/14/01 TD:  01/15/01 Job: [id number]

## 2011-02-14 NOTE — Consult Note (Signed)
[place]  Patient:    Crystal [name], Crystal [name]                        MRN: [id number] Proc. Date: 01/19/01 Adm. Date:  [id number] Attending:  Cleatrice Burke                          Consultation Report  REQUESTING PHYSICIAN:  Francisca December, M.D.  PRIMARY CARE PHYSICIAN:  Kizzie Furnish, M.D.  REASON FOR CONSULTATION:  Coronary artery disease and failed angioplasty.  HISTORY OF PRESENT ILLNESS:  Patient is a 60 year old female with a Lemaire smoking history and known peripheral vascular and coronary artery disease.  In 1999 she had ______ of a diagonal branch and stenting of the right coronary artery.  She had initial PTCA and stenting of the ostial right coronary artery lesion in April 1999.  In November 2001 she had recurrent chest pain and underwent repeat cardiac catheterization which showed restenosis of the right coronary ostial stent.  Repeat angioplasty was performed.  Patient now presented on January 14, 2001 with episodes of chest discomfort radiating to the right jaw lasting two to three minutes.  Pain was intermittent, sometimes at rest, sometimes with activity.  At least one episode of pain patient awoke with the pain and called Dr. Amil Amen office.  She was advised to take nitroglycerin and presented to the emergency room.  Previous cardiac history: Patient has had no previous history of myocardial infarction.  She has had no prior cardiac surgery.  Cardiac risk factors include hypertension, hyperlipidemia, positive family history with death of her mother at age 70 of MI.  She has had no previous stroke or TIAs.  She does have known peripheral vascular disease having had superficial femoral endarterectomy in 1999 by Dr. Arbie Cookey.  She denies diabetes.  She has a [place]-standing smoking history 35 years at least one pack per day and has not stopped since her angioplasty.  PAST MEDICAL HISTORY: 1. Kidney stones. 2. Question of idiopathic  cardiomyopathy, though the recent catheterization    showed no evidence of LV dysfunction. 3. Depression. 4. Tobacco abuse. 5. Gastroesophageal reflux disease. 6. Inflammatory bowel syndrome. 7. History of renal stones. 8. History of right superficial femoral endarterectomy with vein patch    angioplasty February 1999.  SOCIAL HISTORY:  The patient is divorced and lives with three children.  She is disabled due to vascular and cardiac disease.  ALLERGIES:  PENICILLIN which causes hives and itching.  CURRENT MEDICATIONS:  Aspirin, Buspar, Lasix, Zocor, Zoloft, Zestril, Toprol, Prilosec, aspirin, Dicyclomine.  FAMILY HISTORY:  As noted, the patients mother died at age 30 of myocardial infarction.  Father died at age 33 following postoperative complications of duodenal ulcer disease surgery.  REVIEW OF SYSTEMS:  GENERAL:  Patient is unable to work because of her vascular disease.  She notes exertional shortness of breath. GASTROINTESTINAL:  Patient denies any bowel problems.  Has had no blood in her stool.  NEUROLOGIC:  Patient notes tingling sensations all over, sometimes in her feet, sometimes in her hands, but no classic amaurosis or TIA symptoms. MUSCULOSKELETAL:  Denies.  GENITOURINARY:  Denies any urinary problems.  Has no blood in her urine.  PULMONARY:  Denies any recent infections. HEMATOLOGIC:  She denies any history of easy bruisability.  ENDOCRINE:  Denies diabetes or thyroid problems.  PSYCHIATRIC:  Positive for depression.  Patient has claudication involving both  lower extremities.  Denies one side being worse than other.  PHYSICAL EXAMINATION  GENERAL:  A 60 year old female appearing older than her stated age.  Patient is present with a friend and one daughter.  Patient has just returned from cardiac catheterization.  She is awake and alert and able to reveal her history.  VITAL SIGNS:  Temperature 98.7, heart rate 55 and sinus, respiratory rate 23 without  stress, blood pressure 98/46.  SKIN:  Warm and dry.  NECK:  Soft right carotid bruit.  None on the left.  LUNGS:  Clear without wheezing.  CARDIAC:  Regular rate and rhythm without murmur or gallop.  There are no palpable lymph nodes in the axilla or supraclavicular or cervical area.  ABDOMEN:  Soft, nontender without masses.  She has pressure dressing over the right groin, palpable pulse at the left groin.  There is no edema.  EXTREMITIES:  Patient has a small incision at the right ankle from previous vein harvest.  She has 2+ PT pulses bilaterally and 1+ DP pulses bilaterally.  LABORATORIES:  White count 7.2, hematocrit 31.8, MCV elevated 102, platelet count 273.  Sodium 4.4, creatinine 1.0.  Cholesterol 122, triglycerides 217, HDL 29, LDL 50.  Chest x-ray shows no evidence of acute disease.  EKG shows sinus bradycardia without acute changes.  Cardiac catheterization films from November 2001, patients catheterization on January 15, 2001, and repeat catheterization on January 19, 2001:  These revealed significant instent ostial stenosis of the right coronary artery with a reasonable distal vessel.  Patient has some luminar irregularities in the proximal circumflex but on review of multiple films it does not appear to be a significant lesion.  A diagonal which has previously been angioplastied appears patent.  IMPRESSION: 1. Patient with significant right coronary artery disease and unstable anginal    symptoms with failed angioplasty attempted on the proximal right coronary    artery stent. 2. History of chronic depression. 3. Anemia of unknown cause. 4. Ongoing tobacco abuse.  SUGGESTIONS:  I have discussed with the patient and her daughters smoking cessation and recommended continued effort toward this.  In addition, we recommended coronary artery bypass grafting which can be arranged for April 24.  Dr. Laneta Simmers is available.  I have discussed this with the patient including  the risk of surgery including death, infection, stroke, myocardial infarction, bleeding, blood transfusion.  The patients and her daughters  questions have been answered in detail and she is willing to proceed.  She is aware of some increased risk of surgery because of her known [place]-term smoking history. DD:  01/19/01 TD:  01/20/01 Job: [id number]

## 2011-02-14 NOTE — Op Note (Signed)
NAME:  Crystal [name], Crystal [name]                 ACCOUNT NO.:  [id number]   MEDICAL RECORD NO.:  [id number]          PATIENT TYPE:  AMB   LOCATION:  SDS                          FACILITY:  [facility]   PHYSICIAN:  Corky Crafts, MDDATE OF BIRTH:  1950-12-09   DATE OF PROCEDURE:  10/20/2005  DATE OF DISCHARGE:  10/20/2005                                 OPERATIVE REPORT   REFERRING PHYSICIAN:  Francisca December, M.D.   PROCEDURE PERFORMED:  Abdominal aortogram, pelvic angiogram, bilateral lower  extremity runoff, pressure assessment of the right superficial femoral  artery.   INDICATIONS FOR PROCEDURE:  Bilateral claudication with abnormal ankle  brachial indices on the right leg.   OPERATOR:  Corky Crafts, MD   DESCRIPTION OF PROCEDURE:  The risks and benefits of the procedure were  explained to the patient and informed consent was obtained.  The patient was  brought to the peripheral vascular lab and placed on the table.  She was  prepped and draped in the usual sterile fashion.  The left groin was  infiltrated with 1% lidocaine.  A 5 French sheath was placed into the left  femoral artery using modified Seldinger technique.  A pigtail catheter was  advanced to the abdominal aorta and abdominal aortogram was obtained using a  power injection of contrast.  The pigtail catheter was then withdrawn to the  iliac bifurcation, pelvic angiogram was then obtained using power injection  of the contrast.  The pigtail catheter was removed and exchanged for an  internal mammary artery catheter.  A Wholey wire was placed down the right  common iliac using the IMA catheter.  The IMA catheter was then exchanged  for an end hole catheter.  The distal tip of the end hole catheter was  placed in the right external iliac.  The right lower extremity runoff was  obtained using power injection of contrast.  A hemodynamic pressure  assessment was then performed of the prior vein patch angioplasty site.  The  [facility] wire was advanced into the superficial femoral artery.  The end hole  catheter was advanced past the site of the prior surgery.  The wire was  withdrawn.  200 mcg of intra-arterial nitroglycerin was administered through  the end hole catheter.  The catheter was then gradually withdrawn into the  proximal superficial femoral artery.  This process was repeated.  The end  hole catheter was advanced distal to the prior surgical site over the [facility]  wire and an additional 200 mcg of intra-arterial nitroglycerin were  administered down the end hold catheter.  The end hole catheter was then  gradually withdrawn under continuous pressure monitoring. Since there was no  significant pressure gradient, the end hole catheter was removed.  The left  leg runoff was then performed using the 5 French left femoral arterial  sheath.   FINDINGS:  There is diffuse disease of the infrarenal aorta.  There is no  significant aortic aneurysm.  The renal arteries are widely patent  bilaterally.  The right common iliac has luminal irregularities with ostial  20% lesion.  The left common iliac has mild luminal irregularities.  The  internal iliac arteries are widely patent bilaterally.  The external iliac  arteries are widely patent with luminal irregularities bilaterally.  The  common femorals are small but widely patent bilaterally.  The right  superficial femoral artery has moderate atherosclerosis diffusely.  There is  no area of focal stenosis. The site of the prior vein patch angioplasty is  widely patent.  There is no pressure gradient across the vein patch  angioplasty site.  The right posterior tibial, right anterior tibial and  right peroneal arteries are widely patent.  The right posterior tibial is a  dominant vessel below the knee.  The anterior tibial and right peroneal are  small.  The left superficial femoral artery also has moderate  atherosclerosis.  There is also patent three-vessel runoff  below the knee.  The dominant vessel is the left posterior tibial artery. The left anterior  tibial and peroneal vessels are small.   IMPRESSION:  1.  No hemodynamically significant stenosis.  There is moderate      atherosclerosis diffusely of the superficial femoral arteries      bilaterally.  In general, her lower extremity vessels do appear small.   RECOMMENDATIONS:  1.  Continue aggressive risk factor modification.  I encouraged the patient      to stop smoking.  2.  The left femoral arterial sheath will be removed using manual      compression.  3.  The patient will follow up with Dr. Amil Amen.   There were no apparent complications during this procedure.      Corky Crafts, MD  Electronically Signed     JSV/MEDQ  D:  10/20/2005  T:  10/21/2005  Job:  [id number]

## 2011-02-14 NOTE — Discharge Summary (Signed)
NAME:  Crystal [name], Crystal [name]                           ACCOUNT NO.:  [id number]   MEDICAL RECORD NO.:  [id number]                   PATIENT TYPE:  INP   LOCATION:  3714                                 FACILITY:  [facility]   PHYSICIAN:  Francisca December, M.D.               DATE OF BIRTH:  Oct 29, 1950   DATE OF ADMISSION:  02/03/2004  DATE OF DISCHARGE:  02/05/2004                                 DISCHARGE SUMMARY   ADMISSION DIAGNOSES:  1. Unstable angina.  2. Hypertension.  3. Tobacco abuse.  4. Mildly elevated cardiac enzymes.   DISCHARGE DIAGNOSES:  1. Chest pain.  Negative for myocardial infarction.  2. Coronary artery disease.     a. Status post coronary artery bypass graft in 2002.     b. Status post Cypher stent to end-stent restenosis of the right coronary        artery on Feb 02, 2004, at [facility].  3. Hypertension, controlled.  4. Hyperlipidemia on appropriate medical therapy.  5. Tobacco abuse.  Needs cessation.   PROCEDURES:  None.   DISCHARGE STATUS:  Stable and improved.   HISTORY OF PRESENT ILLNESS:  Please see the complete H&P for details, but in  short, this is a 60 year old female who has known CAD being status post CABG  with an RIMA to the RCA in 2002 after multiple interventions to that same  vessel.  She was found to have complete occlusion of that bypass graft by  cardiac catheterization in February of 2005.  At that time, he was referred  to [facility] for further treatment options.  She  underwent successful Cypher stent placement to end-stent restenosis of the  RCA on Feb 02, 2004.  She was discharged home.  She again had recurrent chest  pain for which she presented to the emergency room.   PHYSICAL EXAMINATION ON ADMISSION:  Please see the complete H&P.  Vital  signs were stable.  She had a mildly elevated temperature of 100.3 degrees.  The physical exam was essentially without abnormalities.   LABORATORY DATA:  EKG  showed normal sinus rhythm with some nonspecific  anterior ST-T wave changes in V2 through V4.  This was essentially unchanged  since her last tracing in January of 2005.   Admission laboratories showed essentially normal CBC and BNP.  D-dimer was  slightly elevated at 0.76.  Admission cardiac enzymes showed a total CK of  47 with 2.3 MBs and a troponin of 0.23.   HOSPITAL COURSE:  She was admitted to rule out MI with serial enzymes.  Her  medical therapy will be continued.  If she had recurrent chest pain, IV  nitroglycerin would be started.  She was started on subcutaneous Lovenox.   Essentially for the remainder of her admission she was without recurrent  chest pain.  She did complain of some nausea, as well as some bilateral  calf  pain with some numbness and tingling to her feet.  Lower extremity Dopplers  were ordered and were actually performed on Feb 05, 2004, which were  completely normal.  CT scan of the chest ruled out PE, however, did show  some short of left ventricular defect that was questionable for mass or  thrombus.  A 2-D echocardiogram was ordered.  It was performed on Feb 05, 2004.  That report is still pending at the time of this dictation.  Repeat  cardiac enzymes showed a troponin trending down.  It went from 0.18 down to  0.16.  A total CK of 29 with 1.0 MBs.  The physical exam was essentially  unchanged.  By Feb 05, 2004, she was up and ambulating in the halls without  any difficulties.  She had no recurrent chest pain or leg pain.  She was  anxious to go home.  Vital signs were stable.  The CBC and CMP that morning  were normal with the exception of some mild thrombocytopenia with a platelet  count of 90.   Dr. Amil Amen decided that the patient could go home.  She will follow up in  the office with him in one month or sooner for recurrent problems.   DISCHARGE MEDICATIONS:  1. Norvasc 5 mg daily.  2. Lasix 40 mg daily.  3. Vytorin 10/40 mg daily.  4. Folate 1  mg daily.  5. Prilosec 20 mg daily.  6. Toprol XL 25 mg daily.  7. Niaspan 2000 mg at h.s.  8. Zoloft 100 mg daily.  9. Plavix 75 mg daily.  10.      Aspirin 325 mg daily.  11.      Nitroglycerin 0.4 mg p.r.n.   ACTIVITY:  She has no active restrictions.   DIET:  She was instructed to maintain a low-salt, low-fat, low-cholesterol  diet.   SPECIAL INSTRUCTIONS:  She needs to stop smoking.   FOLLOWUP:  She has an appointment to see Dr. Amil Amen for followup on Friday,  March 08, 2004, at 2:20 p.m.      Adrian Saran, N.P.                        Francisca December, M.D.    HB/MEDQ  D:  02/05/2004  T:  02/05/2004  Job:  [id number]

## 2011-02-14 NOTE — Cardiovascular Report (Signed)
[place]  Patient:    Crystal [name], Crystal [name]                        MRN: [id number] Proc. Date: 01/19/01 Adm. Date:  [id number] Attending:  Cleatrice Burke CC:         [facility]  Cath Lab   Cardiac Catheterization  PROCEDURE:  Percutaneous transluminal coronary angioplasty, unsuccessful, RCA.  INDICATIONS:  Crystal [name] is a 60 year old woman admitted January 14, 2001, with symptoms of unstable angina.  She underwent coronary angiography on January 15, 2001, which showed in-stent restenosis proximally in the right coronary.  The stent was placed in October of 2001.  She has returned today to the catheterization laboratory for percutaneous revascularization with possible brachytherapy.  DESCRIPTION OF PROCEDURE:  The patient was brought to the cardiac catheterization laboratory in the postabsorptive state.  The right groin was prepped and draped in the usual sterile fashion.  Local anesthesia was obtained with infiltration of 1% lidocaine.  A 7 French catheter sheath was inserted percutaneously into the right femoral artery utilizing an anterior approach over a guiding J-wire.  The patient received 3000 units of heparin. The ostial right coronary stent was noted to be pertruding into the aortic root approximately 3 to 4 mm.  I therefore was generally unsuccessful in cannulating the os of the stented segment.  I used various guiding catheters including the following; 7 [place], 7 [place] FR3.5, 7 [place] AR1, 7 [place] RC3.5 with sideholes, 7 [place] MP1, 6 [place] AL1, 6 [place] ART3.5, 6 [place] FR4, and a 6 [place] FR3.5.  The 6 [place] FR3.5 was the most successful and I initially thought I had adequately cannulated the os of the stent.  I was able to pass a wire into the distal right coronary and easily advance a 2.5 x 20 mm SciMed Maverick intracoronary balloon.  I inflated this in two positions, the second being more proximal to not greater  than 2 atmospheres and it was obvious at that point that the guide wire had traversed a stent strut and that the dilating balloon was not exiting the origin of the stent.  Therefore, I deflated the balloon after 88 seconds and removed both the balloon and the wire.  Further attempts at cannulating the os of the stented segment were unsuccessful.  I did administer an additional 2000 units of heparin late in the case after an ACT was 218 seconds.  ANGIOGRAPHY:  As far as could be determined, there was no change in the degree of stenosis in the stented segment.  It remained 70 to 80% stenotic.  It should be noted that the patient experienced her typical unstable angina symptoms with chest and jaw pain initially in the jaw and then spreading to the chest during balloon dilation.  This was the identical symptoms which she has been experiencing over the past three to five days.  IMPRESSION: 1. Unsuccessful attempted PTCA ostium of the right coronary with in-stent    restenosis. 2. Unstable angina pectoris.  PLAN:  Despite the pausity of disease in the left coronary artery, the patient will need single vessel bypass surgery for definitive treatment.  She has continued to have angina while hospitalized on subcutaneous Lovenox and IV nitroglycerin since her admission. DD:  01/19/01 TD:  01/20/01 Job: [id number]

## 2011-02-14 NOTE — Cardiovascular Report (Signed)
Crystal [name], Crystal [name]                           ACCOUNT NO.:  [id number]   MEDICAL RECORD NO.:  [id number]                   PATIENT TYPE:  OIB   LOCATION:  2899                                 FACILITY:  [facility]   PHYSICIAN:  Francisca December, M.D.               DATE OF BIRTH:  11/10/1950   DATE OF PROCEDURE:  11/22/2003  DATE OF DISCHARGE:  11/22/2003                              CARDIAC CATHETERIZATION   PROCEDURES PERFORMED:  1. Left heart catheterization.  2. Coronary angiography.  3. Left ventriculogram.  4. Ascending aortogram.   CARDIOLOGIST:  Francisca December, M.D.   INDICATIONS:  Crystal [name] is a 59 year old woman who is now 2.5 years status  post single-vessel CABG, RIMA - RCA.  She has had a marked increase,  recently, in her chronic chest pain syndrome.  She has not been able to  tolerate pharmacologic stress.  Exercise stress was submaximal.  Associated  myocardial perfusion  revealed no significant obstructions; however,  exercise stress was not maximal.  She is therefore brought to the  catheterization laboratory at this time to identify possible progressive  disease or early graft failure.   PROCEDURAL NOTE:  The patient was brought to the cardiac catheterization  laboratory in the fasting state.  The right groin was prepped and draped in  the usual sterile fashion.  Local anesthesia was obtained with the  infiltration of 1% lidocaine.  A 6 French catheter sheath was inserted  percutaneously into the right femoral artery utilizing an anterior approach  with a Smart needle.  A 110 cm pigtail catheter was used to measure  pressures in the ascending aorta and in the left ventricle both prior to and  following the ventriculogram.  A 30-degre cine left ventriculogram was  performed utilizing a power injector.  The pigtail catheter was then  exchanged for a 6 French #4 left Judkins catheter.  Cine angiography of the  left coronary artery was conducted in multiple LAO and  RAO projections.  The  left Judkins catheter was then exchanged for a 6 French #4 right Judkins  catheter.  The right Judkins catheter was manipulated into the right  subclavian artery.  The RIMA was never selectively engaged even with an  attempt with an IMA catheter.  Several hand injections, nonselective,  revealed a relatively small RIMA proceeding inferiorly towards the cardiac  shadow.  The IMA catheter was exchanged again for the pigtail catheter and  an AP ascending aortogram performed.  This did adequately opacify the RIMA.  Antegrade was relatively slow and it appeared to be completely occluded in  its midportion.  The pigtail catheter was advanced and again an aortic root  injection performed, which demonstrated the right coronary to be patent with  antegrade flow.  Several attempts with the right Judkins and a short-tip  right guiding catheter to cannulate the RCA were unsuccessful.   Finally, the catheter  and catheter sheaths were removed.  Hemostasis was  achieved by direct pressure.  The patient was transported to the recovery  area in stable condition with an intact distal pulse.   A total of 345 mL of contrast was utilized.   HEMODYNAMIC DATA:  Systemic arterial pressure was 138/62 with a mean of 89  mmHg.  There is no systolic gradient across the aortic valve.  The left  ventricular end diastolic pressure was 11 mmHg pre-ventriculogram.   ANGIOGRAPHIC DATA:  Left Ventriculogram:  The left ventriculogram  demonstrated normal chamber size and normal global systolic function without  regional wall motion abnormality.  A visual estimate of the ejection  fraction was 55-65%.  The stented segment of the proximal right coronary is  easily visible.  There is no significant mitral regurgitation and the aortic  valve appeared normal and trileaflet.   Distal Aortogram:  A distal aortogram was also performed with a pigtail  catheter before its removal.  It did not showed diffuse  atherosclerotic  disease of the distal aorta. No significant obstruction into the proximal  iliacs and widely patent renal arteries.   A right dominant coronary system is present.   Left Main Coronary Artery:  The main left coronary artery was normal.   Left Anterior Artery:  The left anterior descending artery and its branches  showed no significant obstruction.  There was a single large diagonal branch  and the anterior descending artery is transapical.  There is mild diffuse  disease, but no significant obstruction seen.   Left Circumflex Coronary Artery:  The left circumflex coronary artery and  its branches are patent.  There is a proximal tapering of about 30%, most  severe at the midportion.  The left circumflex artery really amounts to a  single major marginal branch, which is the dominant vessel on the lateral  wall of the heart.  No significant obstruction seen.   Right Internal Mammary Artery:  The RIMA was atretic and demonstrated  relatively poor antegrade flow.  Again, this was not selectively engaged.  The vessel was patent for about 6-7 cm and then there was a bifurcation.  The branch to the right coronary as judged by the trail of clips left was  completely occluded at this bifurcation.   The aortic root injection demonstrated antegrade flow into the right  coronary and it is a relatively large vessel.  Again, it was never  selectively engaged and the flush injections performed were not adequate to  comment on the degree of stenosis or obstruction in the stented segment.  The distal vessel is widely patent.   FINAL IMPRESSION:  1. Atherosclerotic coronary vascular disease, single-vessel.  2. Occluded right internal mammary artery to the right coronary.  3. Degree of stenosis within the stented segment of the right coronary is     uncertain at this point.  4. Intact left ventricular size and global systolic function. 5. No significant left coronary artery  disease.   PLAN/RECOMMENDATIONS:  The old angiograms will be reviewed to see if they  were more demonstrable for the degree of stenosis in the proximal right  coronary stented segment.  The only additional option I believe the  patient has at this point is to have a second bypass graft with a vein to  the right coronary versus medical therapy alone.   The patient will be discharged later today as an outpatient.  Francisca December, M.D.    JHE/MEDQ  D:  11/22/2003  T:  11/23/2003  Job:  [id number]   cc:   Kizzie Furnish, M.D.  [address]  [place]  [place] [address]  Fax: [phone]   Cardiac Catheterization Laboratory

## 2011-02-14 NOTE — Op Note (Signed)
[place]  Patient:    Crystal [name], Crystal [name]                        MRN: [id number] Proc. Date: 01/20/01 Adm. Date:  [id number] Attending:  Cleatrice Burke CC:         Cath lab  Francisca December, M.D.   Operative Report  PREOPERATIVE DIAGNOSIS:  Severe single-vessel coronary artery disease with restenosis of proximal right coronary stent.  POSTOPERATIVE DIAGNOSIS:  Severe single-vessel coronary artery disease with restenosis of proximal right coronary stent.  OPERATION PERFORMED:  Median sternotomy, extracorporeal circulation, coronary artery bypass graft surgery x 1 using a right internal mammary artery graft to the right coronary artery.  SURGEON:  Alleen Borne, M.D.  ASSISTANT:  Lissa Hoard, P.A.  ANESTHESIA:  General endotracheal.  INDICATIONS FOR PROCEDURE:  The patient is a 60 year old woman with a history of smoking, hypertension and peripheral vascular disease who has previously undergone angioplasty of a diagonal branch and has had a stent put in an ostial right coronary stenosis in November 2001.  She was readmitted on January 14, 2001 with recurrent chest pain.  She underwent cardiac catheterization which showed normal left ventricular function.  The angioplasty site of the diagonal branch was patent.  There was an 80 to 90% in-stent restenosis in the proximal right coronary stent.  This was apparently the second recurrent restenosis.  The patient was taken back to the catheterization lab on January 19, 2001 for an attempted angioplasty of the right coronary artery but this was unsuccessful.  The patient continued to have unstable angina pain.  It was felt that coronary artery bypass graft surgery with a right internal mammary graft was the best treatment for this patient.  The operative procedure was discussed  with the patient including alternatives to surgery, benefits, and risks including bleeding, possible blood transfusion,  infection, stroke, graft failure, myocardial infarction, and death.  She understood and agreed to proceed.  DESCRIPTION OF PROCEDURE:  The patient was taken to the operating room and placed on the table in supine position.  After induction of general endotracheal anesthesia, a Foley catheter was placed in the bladder using sterile technique.  Then the chest, abdomen and both lower extremities were prepped and draped in the usual sterile manner.  The chest was entered through a median sternotomy incision and the pericardium opened in the midline. Examination of the heart showed good ventricular contractility.  The ascending aorta was relatively short.  There were no palpable plaques in it.  Then the right internal mammary artery graft was harvested as a pedicle.  This was a small caliber vessel with good blood flow through it.  Then the patient was heparinized and when an adequate activated clotting time was achieved, the distal ascending aorta was cannulated using a 20 French aortic cannula for arterial inflow.  Venous outflow was achieved using a two-stage venous cannula through the right atrial appendage.  An antegrade cardioplegia and vent cannula was inserted into the aortic root.  The patient was placed on cardiopulmonary bypass and right coronary artery identified.  The right coronary artery was deep in the atrioventricular groove in its proximal portion but I was able to localize it in its midportion.  It was still lying beneath the surface in this area.  Then the aorta was cross-clamped and 500 cc of cold blood antegrade cardioplegia was administered in the aortic root with quick arrest of the  heart.  Systemic hypothermia to 20 degrees centigrade and topical hypothermia with iced saline was used.  A temperature probe was placed in the septum and an insulating pad in the pericardium.  The distal anastomosis was performed to the midportion of the right coronary artery.  The  conduit used was the right internal mammary artery graft and this was brought through an opening in the right pericardium anterior to the phrenic nerve.  The internal diameter of the right coronary artery here was about 1.75 mm.  Anastomosis was performed in end-to-side manner using continuous 8-0 Prolene suture.  The pedicle was tacked to the epicardium with 6-0 Prolene sutures.  The clamp was removed from the mammary pedicle.  There was rapid flow of blood seen down the right coronary artery and return of ventricular contractility on the inferior wall.  The crossclamp was removed with a time of 29 minutes.  Two temporary right ventricular and right atrial pacing wires were placed and brought out through the skin.  When the patient had rewarmed to 37 degrees centigrade, she was weaned from cardiopulmonary bypass on no inotropic agents.  Total bypass time was 46 minutes.  Cardiac function appeared excellent with a cardiac output of 5 to 6L per minute.  Protamine was given and the venous and aortic cannulas were removed without difficulty.  The patient was given eight units of platelets for coagulopathy and was on Plavix preoperatively.  This resulted in adequate hemostasis.  Three chest tubes were placed with a tube in the posterior pericardium and one in the right pleural space and one in the anterior mediastinum.  The pericardium was reapproximated over the heart.  The sternum was closed with #6 stainless steel wires.  The fascia was closed with continuous #1 Vicryl suture.  The subcutaneous tissues were closed using continuous 2-0 Vicryl and the skin with 3-0 Vicryl subcuticular skin closure. The sponge, needle and instrument counts were correct according to the scrub nurse.  A dry sterile dressing was applied over the incisions and around the chest tubes which were hooked to Pleur-Evac suction.  The patient remained hemodynamically stable and was transported to the SICU in guarded but  stable condition. DD:  01/20/01 TD:  01/20/01  Job: [id number]

## 2011-02-14 NOTE — Op Note (Signed)
NAME:  Crystal [name], Crystal [name]                           ACCOUNT NO.:  [id number]   MEDICAL RECORD NO.:  [id number]                   PATIENT TYPE:  AMB   LOCATION:  ENDO                                 FACILITY:  [facility]   PHYSICIAN:  Graylin Shiver, M.D.                DATE OF BIRTH:  10/22/1950   DATE OF PROCEDURE:  09/13/2003  DATE OF DISCHARGE:                                 OPERATIVE REPORT   PROCEDURE PERFORMED:  Colonoscopy.   INDICATIONS FOR PROCEDURE:  Chronic diarrhea, etiology unclear, rule out  colon lesion.   Informed consent was obtained after explanation of the risks of bleeding,  infection, and perforation.   PREMEDICATIONS:  Fentanyl 50 mcg  IV, Versed 5 mg IV.   DESCRIPTION OF PROCEDURE:  With the patient in the left lateral decubitus  position, a rectal exam was performed and no masses were felt.  The Olympus  colonoscope was inserted into the rectum and advanced around the colon to  the cecum.  Cecal landmarks were identified.  The cecum and ascending colon  were normal.  The transverse colon normal.  The descending colon, sigmoid  and rectum were normal.  Random biopsies were obtained from the right  transverse colon and placed in the same specimen container to look for any  evidence of collagenous or lymphocytic colitis.  The patient tolerated the  procedure well without complications.   IMPRESSION:  Normal-appearing colonic mucosa, biopsies obtained for  histological inspection.   PLAN:  The biopsies will be checked.                                               Graylin Shiver, M.D.    Germain Osgood  D:  09/13/2003  T:  09/13/2003  Job:  [id number]   cc:   Kizzie Furnish, M.D.  [name]  [place]  [place] [address]  Fax: [phone]   Sheppard Plumber. Earlene Plater, M.D.  [address] [place]  [place] [address]  Fax: [phone]

## 2011-02-14 NOTE — Discharge Summary (Signed)
NAMEDELMAR, DONDERO                 ACCOUNT NO.:  [id number]   MEDICAL RECORD NO.:  [id number]          PATIENT TYPE:  INP   LOCATION:  3714                         FACILITY:  [facility]   PHYSICIAN:  Nestor Ramp, MD        DATE OF BIRTH:  10/05/1950   DATE OF ADMISSION:  01/24/2009  DATE OF DISCHARGE:  01/26/2009                               DISCHARGE SUMMARY   NOTE:  Please note that the patient left against medical advice.   DISCHARGE DIAGNOSES:  1. Transient ischemic attack.  2. Coronary artery disease with history of stent, coronary artery      bypass graft of right coronary artery in 2002.  3. Peripheral vascular disease.  4. History of nephrolithiasis.  5. History of irritable bowel syndrome.  6. Hyperlipidemia.  7. Depression.  8. Hypertension.  9. Gastroesophageal reflux disease.   DISCHARGE MEDICATIONS:  Please note that the patient signed out AMA  prior to being given instructions regarding her medications, but the  patient's admission medications were as follows:  1. Allegra 180 mg p.o. daily.  2. Aspirin 325 mg p.o. daily.  3. Plavix 75 mg p.o. daily.  4. Ativan 0.5 mg daily p.r.n. anxiety.  5. Cymbalta 120 mg p.o. daily.  6. Folic acid 1 mg p.o. daily.  7. Mobic 15 mg p.o. daily.  8. Multivitamin p.o. daily.  9. Niaspan 1000 mg p.o. at bedtime.  10.Norvasc 5 mg p.o. daily.  11.Pletal 50 mg p.o. b.i.d.  12.Prilosec 20 mg p.o. b.i.d.  13.Imdur 20 mg p.o. b.i.d.   CONSULTATIONS:  Neurology.   LABORATORY DATA:  The patient's labs are as follows:  On admission, CBC  with differential within normal limits.  Sodium 143, potassium 3.0,  chloride 109, bicarb 25, glucose 100, BUN 3, and creatinine 0.79.  LFTs  within normal limits.  Total protein 5.7, albumin 3.1, and calcium 8.8.  Hemoglobin A1c 5.6.  Cardiac enzymes negative x2 sets.  TSH 1.125.  UDS  positive for benzodiazepines, opiates, and alcohol level less than 5.   STUDIES:  1. MRI/MRA head and neck shows no  acute abnormality, patent internal      carotid arteries bilaterally, posterior cerebral artery is patent      bilaterally, vertebral artery is patent bilaterally, and basilar      artery is patent bilaterally.  Anterior and middle cerebral      arteries are patent bilaterally, negative for cerebral aneurysm or      ischemic event or hemorrhage.  2. A 2-D echo on January 25, 2009 showed no cardiac source of embolism.      However, could not be ruled out on basis of this examination.      Systolic function normal.  EF 45-40%.  No wall motion      abnormalities.  PFO cannot be excluded based on this study.   BRIEF HOSPITAL COURSE:  This is a 60 year old female with past medical  history significant for CAD, hypertension, peripheral vascular disease  who presented with symptoms concerning for transient ischemic attack.  1. Question of transient ischemic  attack.  The patient presented with      intermittent 2-day complaints of right facial weakness, slurred      speech as well as right leg and right arm weakness.  These symptoms      had resolved on presentation.  The patient also with complaints of      2-day history of right pupillary dilation.  This symptom remained      present on the patient's admission.  Neurology was consulted      regarding the patient's symptoms and TIA workup was performed.  The      patient with negative cardiac enzymes.  The patient with negative      MRI/MRA of head and neck.  The patient with negative 2-D echo.  The      patient with a resolution of symptoms except for right pupillary      dilation.  On review of history on admission, the patient admitted      to using medication for her dog.  The patient used this medication      in her right eye over the past 4 days prior to admission.  On day      of discharge, the medication which the patient was using was found      to be atropine drops which explained her right pupillary dilation.      The patient  throughout the hospital course did not have any      residual neurologic findings except for right anisocoria as above      which improved during course of the patient's hospitalization.  So      that, upon the point of the patient leaving against medical advice,      the patient's right pupil though still markedly dilated as compared      to left was sluggishly responsive to light.  The patient remained      normotensive during course of hospitalization and the patient's      Norvasc was initially held with concern for hypoperfusion but this      will be restarted.  The patient left prior to being discharged by      the [facility], however, again did not have any residual      symptoms except for right anisocoria as above.  2. Hypertension.  The patient was normotensive during the course of      hospitalization.  The patient's Norvasc was initially held given      concern for hypoperfusion with possible transient ischemic attack      symptoms.  Please note that the patient left AMA prior to her being      discharged by the [facility] and will likely restart her      Norvasc.  3. Coronary artery disease.  This problem was stable for the patient      during course of her hospitalization.  The patient was without EKG      changes and cardiac enzymes were negative x2 sets.  The patient did      not have any chest pain during course of hospital hospitalization      and the patient left AMA and will likely restart her aspirin and      Plavix as above.  4. Peripheral vascular disease.  This problem was stable for the      patient during course of her hospitalization and the patient left      against medical advice and will likely resume  her aspirin, Plavix,      and Pletal upon discharge.  5. Hyperlipidemia.  This problem was stable for the patient during      course of her hospitalization.  The patient with lab testing with a      panel in September which was within normal  limits.  The patient was      continued on aspirin.  6. Depression.  This problem was stable for the patient during course      of her hospitalization and the patient will likely restart her      Cymbalta on discharge.  7. Gastroesophageal reflux disease.  This problem was stable for the      patient during course of her hospitalization and the patient will      likely restart her Prilosec on discharge.   FOLLOWUP:  The patient left before followup could be arranged, however,  the patient will likely follow up with her cardiologist as well as  vascular doctors upon discharge.  This patient left against medical  advice prior to being discharged.      Bobby Rumpf, MD  Electronically Signed      Nestor Ramp, MD  Electronically Signed    KC/MEDQ  D:  01/29/2009  T:  01/30/2009  Job:  [id number]

## 2011-02-14 NOTE — Discharge Summary (Signed)
NAME:  [name], Crystal BEZANSON                           ACCOUNT NO.:  [id number]   MEDICAL RECORD NO.:  [id number]                   PATIENT TYPE:  INP   LOCATION:  0347                                 FACILITY:  [facility]   PHYSICIAN:  Malachi Pro. Ambrose Mantle, M.D.              DATE OF BIRTH:  [birth year]   DATE OF ADMISSION:  12/05/2003  DATE OF DISCHARGE:  12/07/2003                                 DISCHARGE SUMMARY   HISTORY:  A 60 year old white female admitted with pelvic relaxation and  fecal incontinence for vaginal hysterectomy, A&P repair, and sphincter  repair.   HISTORY:  The patient underwent vaginal hysterectomy, A&P repair, repair of  the rectal sphincter by Dr. Ambrose Mantle with Dr. Senaida Ores assisting under  general anesthesia on December 05, 2003.  Blood loss was about 100 cc.  One  vaginal pack was left.  It was removed on the first postoperative day and  was moderately stained with blood.  The patient had a temperature maximum of  100.3 degrees on the first postoperative day, but remained afebrile on the  second postoperative day, and at 6 p.m. on the second postoperative day  temperature was 98.1, and she was ready for discharge.  The patient was  placed on a floor that we are not used to having patients on and their  normal vital sign routine is every 12 hours, 6 p.m. and 6 a.m., so the  patient's temperature was only taken twice on the day of discharge, but at 6  p.m. it was 98.1.  She has had no chills or suggestion of fever.  She has  voided twice since her catheter was removed at approximately 10 a.m. on the  day of discharge.  She voided approximately 250 cc on one occasion and 200  cc on the other occasion.  She does not feel distended and feels that she  has emptied her bladder.  At 6 p.m. on the second postoperative day, she was  felt stable and ready for discharge.  She was ambulating well without  difficulty, voiding well, passing flatus, tolerating a regular diet.   FINAL  DIAGNOSES:  1. Pelvic relaxation with uterine prolapse and cystocele.  2. Small rectocele.  3. Very thin rectal sphincter.   OPERATION:  Vaginal hysterectomy, rectal sphincter repair.   CONDITION ON DISCHARGE:  Improved.   DIET:  Regular diet.   ACTIVITY:  No vaginal entrance.  No heavy lifting or strenuous activity.   FOLLOWUP:  Call with any fever greater than 100.6 degrees, otherwise return  to the office in 10 days for follow up examination.   DISCHARGE MEDICATIONS:  Demerol 50 mg #24 tablets one or two q.3-4h. p.r.n.  pain is given at discharge.  Malachi Pro. Ambrose Mantle, M.D.    TFH/MEDQ  D:  12/07/2003  T:  12/08/2003  Job:  [id number]

## 2011-02-14 NOTE — Op Note (Signed)
NAME:  Crystal [name], Crystal [name]                           ACCOUNT NO.:  [id number]   MEDICAL RECORD NO.:  [id number]                   PATIENT TYPE:  AMB   LOCATION:  DAY                                  FACILITY:  [facility]   PHYSICIAN:  Malachi Pro. Ambrose Mantle, M.D.              DATE OF BIRTH:  January 16, 1951   DATE OF PROCEDURE:  04/30/2004  DATE OF DISCHARGE:                                 OPERATIVE REPORT   PREOPERATIVE DIAGNOSIS:  Left ovarian mass.   POSTOPERATIVE DIAGNOSES:  1. Bilateral ovarian adhesions.  2. Benign serous cystadenoma of the left ovary.   OPERATION:  1. Open laparoscopy.  2. Division of bilateral ovarian adhesions.  3. Bilateral salpingo-oophorectomy.   OPERATOR:  Malachi Pro. Ambrose Mantle, M.D.   ASSISTANT:  Zenaida Niece, M.D.   ANESTHESIA:  General.   The patient was brought to the operating room and placed under satisfactory  general anesthesia.  The abdomen and urethra were prepped with Betadine  solution.  The bladder was emptied with a Foley catheter and left to  straight drain.  The abdomen was then draped as a sterile field.  A ring  forceps and a sponge were placed into the vagina for manipulation.  An open  laparoscopy was done by incising the inferior portion of the umbilicus,  dissecting down to the fascia, grasping the fascia with Kocher clamps, and  then entering the peritoneal cavity after elevating the fascia.  It was  confirmed that there were no adhesions around the umbilical site.  Hasson  cannula was then placed into the peritoneal cavity after a pursestring  suture of 0 Vicryl was placed around the fascial opening.  Two 5 mm ports  were then placed lateral to the epigastric vessels under direct vision.  Exploration of the pelvis revealed the enlarged left ovarian mass which was  about 4 cm in diameter.  The right ovary appeared normal.  Both tubes had  been ligated.  There were adhesions to both ovaries.  These were divided by  the tripolar electrocautery,  and this allowed visualization of the  infundibulopelvic ligaments.  The left infundibulopelvic ligament was taken  down with tripolar and then cutting it free of its attachments.  The right  ovary was handled in exactly the same manner.  Both ovaries were placed in  the lower pelvis.  A bag was placed through the umbilical port, and both  ovaries and tubes were placed into the bag and then removed.  These were  sent to the lab with the request for frozen section on the left ovary.  I  had obtained washings prior to the removal of the ovaries.  The report was  returned as a benign serous cystadenoma of the left ovary, so the washings  were discarded.  Thorough search for any bleeding was made along the left  and right infundibulopelvic ligaments and the mesosalpinx and mesovarian; no  active bleeding was found.  Both lateral ports were removed.  There was no  bleeding from either port.  The umbilical incision was inspected with the  laparoscope as I withdrew it.  I then tied down the pursestring suture, but  there was a gap to the left of the pursestring suture closure, so I closed  it with two interrupted figure-of-eight sutures of 0 Vicryl and then  reinforced the pursestring suture closure.  I closed the subcu tissue with 3-  0 Vicryl and then the skin with interrupted 3-0 plain catgut.  There was  bleeding from the left port that I had to secure with a 3-0 plain catgut  suture.  The remainder of  the lateral ports were closed with Steri-Strips.  The patient seemed to  tolerate the procedure well.  The sponge-stick was removed from the vagina  as well as the sponge, and the patient was returned to recovery in  satisfactory condition.  Blood loss was less than 50 mL.                                               Malachi Pro. Ambrose Mantle, M.D.    TFH/MEDQ  D:  04/30/2004  T:  04/30/2004  Job:  [id number]

## 2011-02-14 NOTE — H&P (Signed)
NAME:  Nedved, YACINE DROZ                           ACCOUNT NO.:  [id number]   MEDICAL RECORD NO.:  [id number]                   PATIENT TYPE:  INP   LOCATION:  1824                                 FACILITY:  [facility]   PHYSICIAN:  W. Ashley Royalty., M.D.         DATE OF BIRTH:  18-Nov-1950   DATE OF ADMISSION:  02/03/2004  DATE OF DISCHARGE:                                HISTORY & PHYSICAL   REASON FOR ADMISSION:  Chest and arm discomfort.   HISTORY OF PRESENT ILLNESS:  A 60 year old female who has a complaint of  cardiac history. She has a [place] standing history of atherosclerotic  peripheral vascular disease and tobacco abuse and hypertension. In 1999, she  had stenting of the right coronary artery and Rotablator of the diagonal  branch. She also had a previous history of a cardiomyopathy, which has  resolved. She has had InStent restenosis and had multiple attempts made to  open this up, which were unsuccessful and ultimately culminated in single  vessel coronary artery bypass grafting on January 20, 2001, the right internal  mammary graft to the right coronary artery by Dr. Laneta Simmers. She is a poor  historian and had had some chronic diarrhea. She was re-catheterized in  February with marked increase in her chronic chest pain syndrome, unable to  tolerate pharmacologic stress. She was found to have occlusion of the  internal mammary graft. The right coronary artery was unable to be  selectively injected at that time. According to the catheterization, she had  normal ventricular function and a normal distal aortogram. The left main  coronary artery was normal. The LAD showed mild diffuse disease. The  circumflex had mild 30% narrowing. The right coronary artery was not  demonstrated selectively. Following this, she underwent a vaginal  hysterectomy and A and P repair of her bladder, repair of her rectal  sphincter in March of this year. Because of recurrent chronic chest pain,  she was  referred to Dr. Cloria Spring of [facility], who admitted  her there Thursday. Yesterday, she underwent an interventional procedure and  she carries a stent card showing placement of a Cypher 3.5 x 23 mm drug  eluting stent in the right coronary artery. She was discharged from there  today and this afternoon, had the onset of upper chest discomfort as well as  left arm discomfort and some pain involving her posterior calves and some  jaw discomfort. She took some nitroglycerin and presented to the emergency  room this evening. A troponin was elevated 0.23 and she has continued to  have some vague chest discomfort. She has an abnormal electrocardiogram  normally with ST depression in V2 and V3. There is no ST elevation. She is a  very poor historian but I could not really get any definite history of  severe chest pain at this time and she is admitted at this time to rule out  a  myocardial infarction.   PAST MEDICAL HISTORY:  Remarkable for previous hypertensive heart disease.  She has a previous idiopathic cardiomyopathy, depression, tobacco abuse,  inflammatory bowel disease, history of renal stones, dyslipidemia, and  claudication.   PAST SURGICAL HISTORY:  She had a superficial right femoral endarterectomy  and vein angioplasty on November 03, 1997 for possible embolus. She has had  an extraction of kidney stones previously and had single vessel coronary  artery bypass grafting. Recent hysterectomy and repair of pelvic relaxation.   ALLERGIES:  PENICILLIN.   CURRENT MEDICATIONS:  Norvasc 5 mg daily, Lasix 40 mg daily, Vytorin 100 mg  daily, Norvasc 5 mg daily, and Toprol XL 25 mg daily.   FAMILY HISTORY:  Recorded in old records and reviewed and is unchanged.   SOCIAL HISTORY:  Smoked heavily for over 30 years. She is on disability. She  has 7 children, alive and well. She is divorced and separated since 1992.   REVIEW OF SYSTEMS:  Difficult. She has a prior history of  depression and  anxiety. She has had some decreased vision and has some mild dizziness. She  has a significant history of reflux and has a history of irritable bowel  syndrome as well as inflammatory bowel disease. She had a colonoscopy in  December for chronic diarrhea, which showed normal appearing colon at that  time. She had some claudication on walking several blocks. She has been  obese. Other than noted above, the remainder of the review of systems is  unremarkable.   PHYSICAL EXAMINATION:  GENERAL:  She is a middle aged female who appears  older than her stated age.  VITAL SIGNS:  Blood pressure 117/62, temperature 100.3.  SKIN:  Warm and dry.  HEENT:  Extraocular muscles intact.  Pupils are equal, round, and reactive  to light and accommodation. CNS clear. Fundi unremarkable. Pharynx is  negative.  NECK:  Supple without masses. No jugular venous distention, thyromegaly or  bruits.  LUNGS:  Clear.  CARDIOVASCULAR:  Normal S1 and S2. No S3 or murmur.  ABDOMEN:  Soft, nontender. There is a previous catheterization site noted in  the left femoral artery.  EXTREMITIES:  Posterior tibial pulses are 2+.   LABORATORY DATA:  Her 12 lead EKG shows ST depression in V2 and V3.   Hemoglobin 12.2 and hematocrit of 34.6. White count of 10,600. CPK was  normal. D-dimer was 0.76, which is slightly elevated. Creatinine is 0.8.  Sodium 137, potassium 3.7, glucose 126 which was slightly elevated. Troponin  was 0.23.   IMPRESSION:  1. Atypical chest and arm pain in a patient with known coronary artery     disease and recent intervention.  2. Complex coronary artery disease with a previous stenting of the right     coronary artery with InStent restenosis that was treated initially with     bypass grafting with subsequent occlusion of the internal mammary graft.  3. Suspected treatment of InStent restenosis at [facility] yesterday     with a 3.5 x 23 mm Cypher drug eluting stent. 4.  Hypertension, under treatment.  5. Cigarette abuse.  6. Mild temperature elevation.  7. History of depression.   RECOMMENDATIONS:  Very difficult to assess in this very poor historian. She  will be admitted, placed on Lovenox and if recurrent pain, IV nitroglycerin.  Rule out  myocardial infarction with serial enzymes and electrocardiogram.  Further workup per Dr. Amil Amen.  Darden Palmer., M.D.    WST/MEDQ  D:  02/04/2004  T:  02/04/2004  Job:  [id number]   cc:   Francisca December, M.D.  [address]  [address]  [place]  [place] [address]  Fax: [phone]   Kizzie Furnish, M.D.  [address]  [place]  [place] [address]  Fax: [phone]

## 2011-02-14 NOTE — Discharge Summary (Signed)
[place]  Patient:    ESLI, CLEMENTS                        MRN: [id number] Adm. Date:  [id number] Disc. Date: [id number] Attending:  Cleatrice Burke Dictator:   Dominica Severin, P.A. CC:         Alleen Borne, M.D.  Francisca December, M.D.  Kizzie Furnish, M.D.  Darden Palmer., M.D.   Discharge Summary  DATE OF BIRTH:  08-22-1951  SURGEON:  Alleen Borne, M.D.  CARDIOLOGIST:  Francisca December, M.D.  PRIMARY CARE PHYSICIAN:  Kizzie Furnish, M.D.  PRIMARY ADMISSION DIAGNOSIS:  Chest pain.  SECONDARY DIAGNOSES/PRE-EXISTING CONDITIONS:  1. Coronary artery disease with stent ostium of right coronary artery in     1999.  2. Cutting balloon and re-stenting of ostium right coronary artery in     November 2001.  3. Previous angioplasty of diagonal in 1999 with good [place]-term results.  4. Hypotension.  5. Current tobacco abuse.  6. History of depression/anxiety.  7. Dyslipidemia.  8. History of kidney stones.  9. Peripheral vascular occlusive disease status post superficial femoral     endarterectomy in 1999. 10. Positive family history of coronary artery disease. 11. Gastroesophageal reflux disease. 12. PENICILLIN allergy which causes a rash and hives.  NEW DIAGNOSES/DISCHARGE DIAGNOSES THIS ADMISSION:  1. Restenosis of proximal right coronary stent with severe single vessel     disease.  2. Postoperative hypotension, resolved.  PROCEDURES:  1. On January 15, 2001, patient underwent cardiac catheterization.  2. On January 19, 2001, attempted PTCA of right coronary artery.  3. Pre-CABG Doppler studies done on January 19, 2001.  4. Coronary artery bypass graft surgery x 1 done on January 20, 2001.  HOSPITAL COURSE:  Patient is a 60 year old Caucasian female who was initially admitted on January 14, 2001 by cardiology service, had a previous cardiac history of stent placements, and patient was having recurrent chest pain.   She was recatheterized and was found to have restenosis of her right coronary artery stent then an attempt to do a PTCA of that stent failed.  Patient was then referred to Dr. Laneta Simmers of [facility] for coronary revascularization.  The patient agreed to such procedure and patient underwent procedure on January 20, 2001.  Tolerated procedure well.  Her postoperative course was notable for some postoperative hypotension.  After medication adjustment patient is now normotensive and is stable and anticipated for discharge today. She did not have any other cardiac or pulmonary complications.  She was ambulated daily by cardiac rehab and tolerated it well.  DISCHARGE CONDITION:  Stable and improved.  DISCHARGE MEDICATIONS: 1. Coated aspirin daily. 2. Zocor 60 mg daily. 3. Prilosec 20 mg b.i.d. 4. Darvocet one to two tablets q.4-6h. as needed for pain. 5. Lasix 40 mg once a day for five days. 6. Potassium chloride 20 mEq a day x 5 days. 7. Toprol XL - she is to take the 25 mg tablets one-half tab once a day.  ACTIVITY AND FOLLOWUP:  Patient instructed not to do any driving or lifting more than 10 pounds, to continue breathing exercises, and to walk daily.  She is told she could wash her wound with mild soap and water and call the office if any wound problems arise as noted on fact sheet, to follow a low-fat, low-sodium diet.  She is instructed to bring the pink sheet  with her to all of her doctors appointments and to call Dr. Amil Amen to see him in two weeks with a chest x-ray and to bring that chest x-ray to the appointment with Dr. Laneta Simmers which she will have in three weeks. DD:  01/24/01 TD:  01/25/01 Job: [id number]

## 2011-06-30 LAB — BASIC METABOLIC PANEL
BUN: 7
BUN: 8
CO2: 20
CO2: 22
Calcium: 8.6
Calcium: 8.9
Chloride: 110
Chloride: 112
Creatinine, Ser: 0.85
Creatinine, Ser: 0.91
GFR calc Af Amer: >60
GFR calc Af Amer: >60
GFR calc non Af Amer: >60
GFR calc non Af Amer: >60
Glucose, Bld: 105 — ABNORMAL HIGH
Glucose, Bld: 99
Potassium: 2.9 — ABNORMAL LOW
Potassium: 3.5
Sodium: 138
Sodium: 142

## 2011-06-30 LAB — COMPREHENSIVE METABOLIC PANEL
ALT: 13
AST: 17
Albumin: 3.6
Alkaline Phosphatase: 54
BUN: 8
CO2: 24
Calcium: 8.7
Chloride: 111
Creatinine, Ser: 0.87
GFR calc Af Amer: >60
GFR calc non Af Amer: >60
Glucose, Bld: 95
Potassium: 2.9 — ABNORMAL LOW
Sodium: 142
Total Bilirubin: 0.8
Total Protein: 5.9 — ABNORMAL LOW

## 2011-06-30 LAB — CARDIAC PANEL(CRET KIN+CKTOT+MB+TROPI)
CK, MB: 2.6
CK, MB: 2.7
Relative Index: 2
Relative Index: 2.2
Total CK: 124
Total CK: 127
Troponin I: 0.01
Troponin I: <0.01

## 2011-06-30 LAB — CBC
HCT: 32.3 — ABNORMAL LOW
HCT: 39
Hemoglobin: 11.1 — ABNORMAL LOW
Hemoglobin: 13.5
MCHC: 34.4
MCHC: 34.6
MCV: 106.3 — ABNORMAL HIGH
MCV: 107.9 — ABNORMAL HIGH
Platelets: 186
Platelets: 235
RBC: 3 — ABNORMAL LOW
RBC: 3.67 — ABNORMAL LOW
RDW: 13.9
RDW: 14
WBC: 5.5
WBC: 7.9

## 2011-06-30 LAB — DIFFERENTIAL
Basophils Absolute: 0
Basophils Relative: 0
Eosinophils Absolute: 0.3
Eosinophils Relative: 4
Lymphocytes Relative: 30
Lymphs Abs: 2.4
Monocytes Absolute: 0.7
Monocytes Relative: 9
Neutro Abs: 4.4
Neutrophils Relative %: 56

## 2011-06-30 LAB — LIPID PANEL
Cholesterol: 124
HDL: 42
LDL Cholesterol: 59
Total CHOL/HDL Ratio: 3
Triglycerides: 116
VLDL: 23

## 2011-06-30 LAB — POCT CARDIAC MARKERS
CKMB, poc: <1 — ABNORMAL LOW
CKMB, poc: <1 — ABNORMAL LOW
Myoglobin, poc: 106
Myoglobin, poc: 90.6
Troponin i, poc: <0.05
Troponin i, poc: <0.05

## 2011-06-30 LAB — HEPARIN LEVEL (UNFRACTIONATED)
Heparin Unfractionated: 0.63
Heparin Unfractionated: 0.66
Heparin Unfractionated: <0.1 — ABNORMAL LOW

## 2011-06-30 LAB — VITAMIN B12: Vitamin B-12: >2000 — ABNORMAL HIGH (ref 211–911)

## 2011-06-30 LAB — CK TOTAL AND CKMB (NOT AT ARMC)
CK, MB: 1.5
Relative Index: INVALID
Total CK: 75

## 2011-06-30 LAB — RETICULOCYTES
RBC.: 3.67 — ABNORMAL LOW
Retic Count, Absolute: 121.1
Retic Ct Pct: 3.3 — ABNORMAL HIGH

## 2011-06-30 LAB — TROPONIN I: Troponin I: 0.02

## 2011-06-30 LAB — PROTIME-INR
INR: 1
Prothrombin Time: 13.9

## 2011-06-30 LAB — FOLATE: Folate: >20

## 2011-06-30 LAB — APTT: aPTT: 30

## 2012-12-09 ENCOUNTER — Emergency Department (HOSPITAL_COMMUNITY): Payer: Medicaid Other

## 2012-12-09 ENCOUNTER — Encounter (HOSPITAL_COMMUNITY): Payer: Self-pay | Admitting: Vascular Surgery

## 2012-12-09 ENCOUNTER — Inpatient Hospital Stay (HOSPITAL_COMMUNITY)
Admission: EM | Admit: 2012-12-09 | Discharge: 2012-12-12 | DRG: 917 | Disposition: A | Discharge status: Home / Self Care | Payer: Medicaid Other | Attending: Internal Medicine | Admitting: Internal Medicine

## 2012-12-09 DIAGNOSIS — F4323 Adjustment disorder with mixed anxiety and depressed mood: Secondary | ICD-10-CM | POA: Diagnosis present

## 2012-12-09 DIAGNOSIS — Y92009 Unspecified place in unspecified non-institutional (private) residence as the place of occurrence of the external cause: Secondary | ICD-10-CM

## 2012-12-09 DIAGNOSIS — M549 Dorsalgia, unspecified: Secondary | ICD-10-CM | POA: Diagnosis present

## 2012-12-09 DIAGNOSIS — M1990 Unspecified osteoarthritis, unspecified site: Secondary | ICD-10-CM | POA: Diagnosis present

## 2012-12-09 DIAGNOSIS — T43502A Poisoning by unspecified antipsychotics and neuroleptics, intentional self-harm, initial encounter: Secondary | ICD-10-CM | POA: Diagnosis present

## 2012-12-09 DIAGNOSIS — T50901A Poisoning by unspecified drugs, medicaments and biological substances, accidental (unintentional), initial encounter: Secondary | ICD-10-CM

## 2012-12-09 DIAGNOSIS — M899 Disorder of bone, unspecified: Secondary | ICD-10-CM | POA: Diagnosis present

## 2012-12-09 DIAGNOSIS — K219 Gastro-esophageal reflux disease without esophagitis: Secondary | ICD-10-CM | POA: Diagnosis present

## 2012-12-09 DIAGNOSIS — G929 Unspecified toxic encephalopathy: Secondary | ICD-10-CM | POA: Diagnosis present

## 2012-12-09 DIAGNOSIS — I25119 Atherosclerotic heart disease of native coronary artery with unspecified angina pectoris: Secondary | ICD-10-CM | POA: Diagnosis present

## 2012-12-09 DIAGNOSIS — T400X1A Poisoning by opium, accidental (unintentional), initial encounter: Secondary | ICD-10-CM | POA: Diagnosis present

## 2012-12-09 DIAGNOSIS — T424X4A Poisoning by benzodiazepines, undetermined, initial encounter: Principal | ICD-10-CM | POA: Diagnosis present

## 2012-12-09 DIAGNOSIS — G8929 Other chronic pain: Secondary | ICD-10-CM | POA: Diagnosis present

## 2012-12-09 DIAGNOSIS — E7800 Pure hypercholesterolemia, unspecified: Secondary | ICD-10-CM | POA: Diagnosis present

## 2012-12-09 DIAGNOSIS — G92 Toxic encephalopathy: Secondary | ICD-10-CM | POA: Diagnosis present

## 2012-12-09 DIAGNOSIS — T398X2A Poisoning by other nonopioid analgesics and antipyretics, not elsewhere classified, intentional self-harm, initial encounter: Secondary | ICD-10-CM | POA: Diagnosis present

## 2012-12-09 DIAGNOSIS — Z79899 Other long term (current) drug therapy: Secondary | ICD-10-CM

## 2012-12-09 DIAGNOSIS — R4182 Altered mental status, unspecified: Secondary | ICD-10-CM

## 2012-12-09 DIAGNOSIS — I10 Essential (primary) hypertension: Secondary | ICD-10-CM | POA: Diagnosis present

## 2012-12-09 DIAGNOSIS — F329 Major depressive disorder, single episode, unspecified: Secondary | ICD-10-CM

## 2012-12-09 DIAGNOSIS — I2510 Atherosclerotic heart disease of native coronary artery without angina pectoris: Secondary | ICD-10-CM

## 2012-12-09 DIAGNOSIS — T68XXXA Hypothermia, initial encounter: Secondary | ICD-10-CM

## 2012-12-09 DIAGNOSIS — G9340 Encephalopathy, unspecified: Secondary | ICD-10-CM

## 2012-12-09 DIAGNOSIS — T394X2A Poisoning by antirheumatics, not elsewhere classified, intentional self-harm, initial encounter: Secondary | ICD-10-CM | POA: Diagnosis present

## 2012-12-09 DIAGNOSIS — R680 Hypothermia, not associated with low environmental temperature: Secondary | ICD-10-CM | POA: Diagnosis present

## 2012-12-09 DIAGNOSIS — E119 Type 2 diabetes mellitus without complications: Secondary | ICD-10-CM | POA: Diagnosis present

## 2012-12-09 DIAGNOSIS — I739 Peripheral vascular disease, unspecified: Secondary | ICD-10-CM | POA: Diagnosis present

## 2012-12-09 DIAGNOSIS — I2589 Other forms of chronic ischemic heart disease: Secondary | ICD-10-CM | POA: Diagnosis present

## 2012-12-09 HISTORY — DX: Pure hypercholesterolemia, unspecified: E78.00

## 2012-12-09 HISTORY — DX: Gastro-esophageal reflux disease without esophagitis: K21.9

## 2012-12-09 HISTORY — DX: Other symptoms and signs involving cognitive functions and awareness: R41.89

## 2012-12-09 HISTORY — DX: Type 2 diabetes mellitus without complications: E11.9

## 2012-12-09 HISTORY — DX: Other chronic pain: G89.29

## 2012-12-09 HISTORY — DX: Dorsalgia, unspecified: M54.9

## 2012-12-09 HISTORY — DX: Essential (primary) hypertension: I10

## 2012-12-09 HISTORY — DX: Chronic ischemic heart disease, unspecified: I25.9

## 2012-12-09 HISTORY — DX: Unspecified osteoarthritis, unspecified site: M19.90

## 2012-12-09 HISTORY — DX: Depression, unspecified: F32.A

## 2012-12-09 HISTORY — DX: Other specified disorders of bone density and structure, unspecified site: M85.80

## 2012-12-09 HISTORY — DX: Major depressive disorder, single episode, unspecified: F32.9

## 2012-12-09 HISTORY — DX: Anxiety disorder, unspecified: F41.9

## 2012-12-09 LAB — URINE MICROSCOPIC-ADD ON

## 2012-12-09 LAB — POCT I-STAT 3, ART BLOOD GAS (G3+)
Acid-base deficit: 3 mmol/L — ABNORMAL HIGH (ref 0.0–2.0)
Bicarbonate: 21.4 mEq/L (ref 20.0–24.0)
O2 Saturation: 90 %
Patient temperature: 93.4
TCO2: 22 mmol/L (ref 0–100)
pCO2 arterial: 30.9 mmHg — ABNORMAL LOW (ref 35.0–45.0)
pH, Arterial: 7.436 (ref 7.350–7.450)
pO2, Arterial: 48 mmHg — ABNORMAL LOW (ref 80.0–100.0)

## 2012-12-09 LAB — URINALYSIS, ROUTINE W REFLEX MICROSCOPIC
Glucose, UA: 250 mg/dL — AB
Ketones, ur: 15 mg/dL — AB
Leukocytes, UA: NEGATIVE
Nitrite: NEGATIVE
Protein, ur: NEGATIVE mg/dL
Specific Gravity, Urine: 1.028 (ref 1.005–1.030)
Urobilinogen, UA: 1 mg/dL (ref 0.0–1.0)
pH: 5.5 (ref 5.0–8.0)

## 2012-12-09 LAB — COMPREHENSIVE METABOLIC PANEL
ALT: 9 U/L (ref 0–35)
AST: 18 U/L (ref 0–37)
Albumin: 3.3 g/dL — ABNORMAL LOW (ref 3.5–5.2)
Alkaline Phosphatase: 72 U/L (ref 39–117)
BUN: 17 mg/dL (ref 6–23)
CO2: 23 mEq/L (ref 19–32)
Calcium: 8.7 mg/dL (ref 8.4–10.5)
Chloride: 105 mEq/L (ref 96–112)
Creatinine, Ser: 1.03 mg/dL (ref 0.50–1.10)
GFR calc Af Amer: 66 mL/min — ABNORMAL LOW (ref >90)
GFR calc non Af Amer: 57 mL/min — ABNORMAL LOW (ref >90)
Glucose, Bld: 165 mg/dL — ABNORMAL HIGH (ref 70–99)
Potassium: 3.6 mEq/L (ref 3.5–5.1)
Sodium: 140 mEq/L (ref 135–145)
Total Bilirubin: 0.6 mg/dL (ref 0.3–1.2)
Total Protein: 6 g/dL (ref 6.0–8.3)

## 2012-12-09 LAB — CBC
HCT: 35.2 % — ABNORMAL LOW (ref 36.0–46.0)
Hemoglobin: 13.1 g/dL (ref 12.0–15.0)
MCH: 34.5 pg — ABNORMAL HIGH (ref 26.0–34.0)
MCHC: 37.2 g/dL — ABNORMAL HIGH (ref 30.0–36.0)
MCV: 92.6 fL (ref 78.0–100.0)
Platelets: 194 10*3/uL (ref 150–400)
RBC: 3.8 MIL/uL — ABNORMAL LOW (ref 3.87–5.11)
RDW: 13.9 % (ref 11.5–15.5)
WBC: 6 10*3/uL (ref 4.0–10.5)

## 2012-12-09 LAB — SALICYLATE LEVEL: Salicylate Lvl: <2 mg/dL — ABNORMAL LOW (ref 2.8–20.0)

## 2012-12-09 LAB — RAPID URINE DRUG SCREEN, HOSP PERFORMED
Amphetamines: NOT DETECTED
Barbiturates: NOT DETECTED
Benzodiazepines: POSITIVE — AB
Cocaine: NOT DETECTED
Opiates: POSITIVE — AB
Tetrahydrocannabinol: NOT DETECTED

## 2012-12-09 LAB — LACTIC ACID, PLASMA: Lactic Acid, Venous: 2.4 mmol/L — ABNORMAL HIGH (ref 0.5–2.2)

## 2012-12-09 LAB — ETHANOL: Alcohol, Ethyl (B): <11 mg/dL (ref 0–11)

## 2012-12-09 LAB — ACETAMINOPHEN LEVEL: Acetaminophen (Tylenol), Serum: <15 ug/mL (ref 10–30)

## 2012-12-09 MED ORDER — VANCOMYCIN HCL IN DEXTROSE 1-5 GM/200ML-% IV SOLN: 1000.0000 mg | INTRAVENOUS | Status: DC

## 2012-12-09 MED ORDER — SODIUM CHLORIDE 0.9 % IJ SOLN
3.0000 mL | Freq: Two times a day (BID) | INTRAMUSCULAR | Status: DC
  Administered 2012-12-09 – 2012-12-12 (×6): 3 mL via INTRAVENOUS

## 2012-12-09 MED ORDER — LEVOFLOXACIN IN D5W 750 MG/150ML IV SOLN
750.0000 mg | Freq: Once | INTRAVENOUS | Status: AC
  Administered 2012-12-09: 750 mg via INTRAVENOUS
  Filled 2012-12-09: qty 150

## 2012-12-09 MED ORDER — ACETAMINOPHEN 650 MG RE SUPP: 650.0000 mg | Freq: Four times a day (QID) | RECTAL | Status: DC | PRN

## 2012-12-09 MED ORDER — ACETAMINOPHEN 325 MG PO TABS: 650.0000 mg | ORAL_TABLET | Freq: Four times a day (QID) | ORAL | Status: DC | PRN

## 2012-12-09 MED ORDER — DEXTROSE 5 % IV SOLN
1.0000 g | Freq: Three times a day (TID) | INTRAVENOUS | Status: DC
  Filled 2012-12-09: qty 1

## 2012-12-09 MED ORDER — SODIUM CHLORIDE 0.9 % IV SOLN
INTRAVENOUS | Status: AC
  Administered 2012-12-09: 22:00:00 via INTRAVENOUS

## 2012-12-09 MED ORDER — ONDANSETRON HCL 4 MG/2ML IJ SOLN: 4.0000 mg | Freq: Four times a day (QID) | INTRAMUSCULAR | Status: DC | PRN

## 2012-12-09 MED ORDER — LEVOFLOXACIN IN D5W 750 MG/150ML IV SOLN: 750.0000 mg | INTRAVENOUS | Status: DC

## 2012-12-09 MED ORDER — PANTOPRAZOLE SODIUM 40 MG IV SOLR
40.0000 mg | INTRAVENOUS | Status: DC
  Administered 2012-12-09: 40 mg via INTRAVENOUS
  Filled 2012-12-09 (×2): qty 40

## 2012-12-09 MED ORDER — ENOXAPARIN SODIUM 40 MG/0.4ML ~~LOC~~ SOLN
40.0000 mg | SUBCUTANEOUS | Status: DC
  Administered 2012-12-09 – 2012-12-11 (×3): 40 mg via SUBCUTANEOUS
  Filled 2012-12-09 (×4): qty 0.4

## 2012-12-09 MED ORDER — VANCOMYCIN HCL IN DEXTROSE 1-5 GM/200ML-% IV SOLN
1000.0000 mg | Freq: Two times a day (BID) | INTRAVENOUS | Status: DC
  Filled 2012-12-09: qty 200

## 2012-12-09 MED ORDER — ONDANSETRON HCL 4 MG PO TABS: 4.0000 mg | ORAL_TABLET | Freq: Four times a day (QID) | ORAL | Status: DC | PRN

## 2012-12-09 MED ORDER — THIAMINE HCL 100 MG/ML IJ SOLN
100.0000 mg | Freq: Every day | INTRAMUSCULAR | Status: DC
  Administered 2012-12-09 – 2012-12-11 (×3): 100 mg via INTRAVENOUS
  Filled 2012-12-09 (×3): qty 1

## 2012-12-09 MED ORDER — DEXTROSE 5 % IV SOLN
1.0000 g | INTRAVENOUS | Status: DC
  Filled 2012-12-09: qty 1

## 2012-12-09 MED ORDER — SODIUM CHLORIDE 0.9 % IJ SOLN
INTRAMUSCULAR | Status: AC
  Filled 2012-12-09: qty 10

## 2012-12-09 MED ORDER — NALOXONE HCL 0.4 MG/ML IJ SOLN
0.2000 mg | Freq: Once | INTRAMUSCULAR | Status: AC
  Administered 2012-12-09: 0.2 mg via INTRAVENOUS
  Filled 2012-12-09: qty 1

## 2012-12-09 MED ORDER — ALBUTEROL SULFATE (5 MG/ML) 0.5% IN NEBU: 2.5000 mg | INHALATION_SOLUTION | RESPIRATORY_TRACT | Status: DC | PRN

## 2012-12-09 NOTE — Progress Notes (Addendum)
ANTIBIOTIC CONSULT NOTE - INITIAL  Pharmacy Consult for vancomycin/zosyn/levaquin Indication: suspected sepsis  Allergies not on file   Vital Signs: Temp: 93.4 F (34.1 C) (03/13 1627) Temp src: Temporal (03/13 1627) BP: 104/66 mmHg (03/13 1627) Pulse Rate: 79 (03/13 1627) Intake/Output from previous day:   Intake/Output from this shift:    Labs: No results found for this basename: WBC, HGB, PLT, LABCREA, CREATININE,  in the last 72 hours CrCl is unknown because there is no height on file for the current visit. No results found for this basename: VANCOTROUGH, VANCOPEAK, VANCORANDOM, GENTTROUGH, GENTPEAK, GENTRANDOM, TOBRATROUGH, TOBRAPEAK, TOBRARND, AMIKACINPEAK, AMIKACINTROU, AMIKACIN,  in the last 72 hours   Microbiology: No results found for this or any previous visit (from the past 720 hour(s)).  Medical History: No past medical history on file.  Assessment: 62 yo female here with AMS to start antibiotics (vancomycin levaquin and azactam) for possible sepsis. Last SCr was 0.95 on 10/15/10.  Weight is not available but is estimated at about 75-80kg. Levaquin 750mg  ordered in ED  Goal of Therapy:  Vancomycin trough level 15-20 mcg/ml  Plan:  -Vancomycin 1000mg  IV q12h, levaquin 750mg  IV q424h and Azactam 1gm IV q8h -Will follow renal function and cultures  Harland German, Pharm D 12/09/2012 5:05 PM

## 2012-12-09 NOTE — H&P (Signed)
[facility] History and Physical  Crystal [name]:[id number] DOB: 1951/07/05 DOA: 12/09/2012   PCP: Ailene Ravel, MD   Chief Complaint: Found unresponsive  HPI: Crystal [name] is a 62 y.o. female with a past medical history of coronary artery disease with stent placements in the past, depression, anxiety, chronic back pain, hypertension, peripheral vascular disease, who was found on the floor of her house. She was found by her family members. EMS was called and the patient was brought into the hospital. Initially, pinpoint pupils were noted. She was given Narcan without any appreciable change. Patient's family is unaware of any head injuries. No recent illness. I did speak with the patient's son at [phone]. He mentioned that over the past 1 month patient has been very depressed. She has made statements stating that she wanted to end her life. So, he thinks that patient may have taken more medication than she should have. There has been no history of any fever. Currently, patient is somnolent. She grimaces with the painful stimuli. However, does not respond otherwise. No history is available from her at this time.  Home Medications: Prior to Admission medications   Medication Sig Start Date End Date Taking? Authorizing Provider  albuterol (PROVENTIL HFA;VENTOLIN HFA) 108 (90 BASE) MCG/ACT inhaler Inhale 2 puffs into the lungs every 6 (six) hours as needed for wheezing.    Yes Historical Provider, MD  ALPRAZolam Prudy Feeler) 1 MG tablet Take 1 mg by mouth 2 (two) times daily as needed for sleep. For acute anxiety   Yes Historical Provider, MD  atorvastatin (LIPITOR) 80 MG tablet Take 80 mg by mouth daily.   Yes Historical Provider, MD  buPROPion (WELLBUTRIN XL) 300 MG 24 hr tablet Take 300 mg by mouth daily.   Yes Historical Provider, MD  cilostazol (PLETAL) 50 MG tablet Take 50 mg by mouth 2 (two) times daily.   Yes Historical Provider, MD  cyclobenzaprine (FLEXERIL) 10 MG tablet Take 10 mg by  mouth 2 (two) times daily as needed for muscle spasms. For neck and muscle pain   Yes Historical Provider, MD  DULoxetine (CYMBALTA) 60 MG capsule Take 120 mg by mouth daily.   Yes Historical Provider, MD  esomeprazole (NEXIUM) 40 MG capsule Take 40 mg by mouth daily before breakfast.   Yes Historical Provider, MD  ezetimibe (ZETIA) 10 MG tablet Take 10 mg by mouth daily.   Yes Historical Provider, MD  HYDROcodone-acetaminophen (NORCO/VICODIN) 5-325 MG per tablet Take 1 tablet by mouth 2 (two) times daily as needed (for back pain).   Yes Historical Provider, MD  isosorbide mononitrate (IMDUR) 60 MG 24 hr tablet Take 60 mg by mouth daily.   Yes Historical Provider, MD  meclizine (ANTIVERT) 25 MG tablet Take 25 mg by mouth 3 (three) times daily as needed for dizziness or nausea. For dizziness and nausea   Yes Historical Provider, MD  nabumetone (RELAFEN) 500 MG tablet Take 1,000 mg by mouth 2 (two) times daily.   Yes Historical Provider, MD  nitroGLYCERIN (NITROSTAT) 0.4 MG SL tablet Place 0.4 mg under the tongue every 5 (five) minutes as needed for chest pain. x3 doses as needed for chest pain   Yes Historical Provider, MD    Allergies:  Allergies  Allergen Reactions  . Penicillins     Unknown   . Sulfa Antibiotics     unknown    Past Medical History: Past Medical History  Diagnosis Date  . Osteoarthritis   . Osteopenia   .  Pseudodementia   . Diabetes mellitus without complication   . Chronic ischemic heart disease   . GERD (gastroesophageal reflux disease)   . Depression   . Anxiety   . Back pain, chronic   . Hypertension   . Hypercholesteremia     Past Surgical History  Procedure Laterality Date  . Tubal ligation      Social History:unable to obtain due to encephalopathy  Living Situation: Unknown Activity Level: Unknown   Family History:  unable to obtain due to encephalopathy  Review of Systems - unable to do due to encephalopathy  Physical Examination  Filed  Vitals:   12/09/12 1630 12/09/12 1645 12/09/12 1700 12/09/12 1715  BP: 116/69 119/67 98/56 93/51   Pulse: 80 77 74 76  Temp: 93.4 F (34.1 C) 94.1 F (34.5 C) 94.3 F (34.6 C) 94.5 F (34.7 C)  TempSrc:      Resp: 19 20 23 23   SpO2: 99% 99% 100% 100%    General appearance: Somnolent. Grimaces with painful stimuli. Doesn't appear to be in any distress. Head: Normocephalic, without obvious abnormality, atraumatic Eyes: Pupils are sluggish to light. They're small, but not pinpoint. Throat: Dry mucous membranes noted Neck: no adenopathy, no carotid bruit, no JVD, supple, symmetrical, trachea midline, thyroid not enlarged, symmetric, no tenderness/mass/nodules and Soft and supple Resp: clear to auscultation bilaterally Cardio: regular rate and rhythm, S1, S2 normal, no murmur, click, rub or gallop GI: soft, non-tender; bowel sounds normal; no masses,  no organomegaly Extremities: extremities normal, atraumatic, no cyanosis or edema Pulses: 2+ and symmetric Skin: Skin color, texture, turgor normal. No rashes or lesions Neurologic: Somnolent. Grimaces to painful stimuli. Withdraws to pain. Plantars are equivocal. Reflexes are equal, bilaterally. Examination limited otherwise.  Laboratory Data: Results for orders placed during the hospital encounter of 12/09/12 (from the past 48 hour(s))  CBC     Status: Abnormal   Collection Time    12/09/12  3:56 PM      Result Value Range   WBC 6.0  4.0 - 10.5 K/uL   RBC 3.80 (*) 3.87 - 5.11 MIL/uL   Hemoglobin 13.1  12.0 - 15.0 g/dL   HCT 16.1 (*) 09.6 - 04.5 %   MCV 92.6  78.0 - 100.0 fL   MCH 34.5 (*) 26.0 - 34.0 pg   MCHC 37.2 (*) 30.0 - 36.0 g/dL   RDW 40.9  81.1 - 91.4 %   Platelets 194  150 - 400 K/uL  COMPREHENSIVE METABOLIC PANEL     Status: Abnormal   Collection Time    12/09/12  3:56 PM      Result Value Range   Sodium 140  135 - 145 mEq/L   Potassium 3.6  3.5 - 5.1 mEq/L   Chloride 105  96 - 112 mEq/L   CO2 23  19 - 32 mEq/L    Glucose, Bld 165 (*) 70 - 99 mg/dL   BUN 17  6 - 23 mg/dL   Creatinine, Ser 7.82  0.50 - 1.10 mg/dL   Calcium 8.7  8.4 - 95.6 mg/dL   Total Protein 6.0  6.0 - 8.3 g/dL   Albumin 3.3 (*) 3.5 - 5.2 g/dL   AST 18  0 - 37 U/L   ALT 9  0 - 35 U/L   Alkaline Phosphatase 72  39 - 117 U/L   Total Bilirubin 0.6  0.3 - 1.2 mg/dL   GFR calc non Af Amer 57 (*) >90 mL/min   GFR calc Af  Amer 66 (*) >90 mL/min   Comment:            The eGFR has been calculated     using the CKD EPI equation.     This calculation has not been     validated in all clinical     situations.     eGFR's persistently     <90 mL/min signify     possible Chronic Kidney Disease.  ACETAMINOPHEN LEVEL     Status: None   Collection Time    12/09/12  3:56 PM      Result Value Range   Acetaminophen (Tylenol), Serum <15.0  10 - 30 ug/mL   Comment:            THERAPEUTIC CONCENTRATIONS VARY     SIGNIFICANTLY. A RANGE OF 10-30     ug/mL MAY BE AN EFFECTIVE     CONCENTRATION FOR MANY PATIENTS.     HOWEVER, SOME ARE BEST TREATED     AT CONCENTRATIONS OUTSIDE THIS     RANGE.     ACETAMINOPHEN CONCENTRATIONS     >150 ug/mL AT 4 HOURS AFTER     INGESTION AND >50 ug/mL AT 12     HOURS AFTER INGESTION ARE     OFTEN ASSOCIATED WITH TOXIC     REACTIONS.  SALICYLATE LEVEL     Status: Abnormal   Collection Time    12/09/12  3:56 PM      Result Value Range   Salicylate Lvl <2.0 (*) 2.8 - 20.0 mg/dL  ETHANOL     Status: None   Collection Time    12/09/12  3:56 PM      Result Value Range   Alcohol, Ethyl (B) <11  0 - 11 mg/dL   Comment:            LOWEST DETECTABLE LIMIT FOR     SERUM ALCOHOL IS 11 mg/dL     FOR MEDICAL PURPOSES ONLY  LACTIC ACID, PLASMA     Status: Abnormal   Collection Time    12/09/12  3:57 PM      Result Value Range   Lactic Acid, Venous 2.4 (*) 0.5 - 2.2 mmol/L  URINALYSIS, ROUTINE W REFLEX MICROSCOPIC     Status: Abnormal   Collection Time    12/09/12  4:21 PM      Result Value Range   Color,  Urine AMBER (*) YELLOW   Comment: BIOCHEMICALS MAY BE AFFECTED BY COLOR   APPearance CLOUDY (*) CLEAR   Specific Gravity, Urine 1.028  1.005 - 1.030   pH 5.5  5.0 - 8.0   Glucose, UA 250 (*) NEGATIVE mg/dL   Hgb urine dipstick SMALL (*) NEGATIVE   Bilirubin Urine SMALL (*) NEGATIVE   Ketones, ur 15 (*) NEGATIVE mg/dL   Protein, ur NEGATIVE  NEGATIVE mg/dL   Urobilinogen, UA 1.0  0.0 - 1.0 mg/dL   Nitrite NEGATIVE  NEGATIVE   Leukocytes, UA NEGATIVE  NEGATIVE  URINE RAPID DRUG SCREEN (HOSP PERFORMED)     Status: Abnormal   Collection Time    12/09/12  4:21 PM      Result Value Range   Opiates POSITIVE (*) NONE DETECTED   Cocaine NONE DETECTED  NONE DETECTED   Benzodiazepines POSITIVE (*) NONE DETECTED   Amphetamines NONE DETECTED  NONE DETECTED   Tetrahydrocannabinol NONE DETECTED  NONE DETECTED   Barbiturates NONE DETECTED  NONE DETECTED   Comment:  DRUG SCREEN FOR MEDICAL PURPOSES     ONLY.  IF CONFIRMATION IS NEEDED     FOR ANY PURPOSE, NOTIFY LAB     WITHIN 5 DAYS.                LOWEST DETECTABLE LIMITS     FOR URINE DRUG SCREEN     Drug Class       Cutoff (ng/mL)     Amphetamine      1000     Barbiturate      200     Benzodiazepine   200     Tricyclics       300     Opiates          300     Cocaine          300     THC              50  URINE MICROSCOPIC-ADD ON     Status: Abnormal   Collection Time    12/09/12  4:21 PM      Result Value Range   Squamous Epithelial / LPF FEW (*) RARE   WBC, UA 0-2  <3 WBC/hpf   Bacteria, UA RARE  RARE   Casts HYALINE CASTS (*) NEGATIVE   Urine-Other MUCOUS PRESENT     Comment: AMORPHOUS URATES/PHOSPHATES  POCT I-STAT 3, BLOOD GAS (G3+)     Status: Abnormal   Collection Time    12/09/12  4:44 PM      Result Value Range   pH, Arterial 7.436  7.350 - 7.450   pCO2 arterial 30.9 (*) 35.0 - 45.0 mmHg   pO2, Arterial 48.0 (*) 80.0 - 100.0 mmHg   Bicarbonate 21.4  20.0 - 24.0 mEq/L   TCO2 22  0 - 100 mmol/L   O2  Saturation 90.0     Acid-base deficit 3.0 (*) 0.0 - 2.0 mmol/L   Patient temperature 93.4 F     Collection site RADIAL, ALLEN'S TEST ACCEPTABLE     Drawn by RT     Sample type ARTERIAL      Radiology Reports: Dg Chest 1 View  12/09/2012  *RADIOLOGY REPORT*  Clinical Data: Altered mental status.  Unresponsive.  CHEST - 1 VIEW  Comparison: 10/15/2010  Findings: Prior median sternotomy.  Heart is borderline in size. Low lung volumes.  Mild peribronchial thickening.  No confluent opacities or effusions.  No acute bony abnormality.  IMPRESSION: Low lung volumes.  Mild bronchitic changes.   Original Report Authenticated By: Charlett Nose, M.D.    Ct Head Wo Contrast  12/09/2012  *RADIOLOGY REPORT*  Clinical Data: Possible overdose.  Altered mental status. Decreased responsiveness.  CT HEAD WITHOUT CONTRAST  Technique:  Contiguous axial images were obtained from the base of the skull through the vertex without contrast.  Comparison: None.  Findings: No evidence of acute infarct, acute hemorrhage, mass lesion, mass effect or hydrocephalus.  Visualized portions of the paranasal sinuses and mastoid air cells are clear.  IMPRESSION: Negative.   Original Report Authenticated By: Leanna Battles, M.D.     Electrocardiogram: Sinus rhythm at 90 beats per minute. Normal axis. Intervals are normal. No acute ST or T-wave changes are noted.  Problem List  Principal Problem:   Acute encephalopathy Active Problems:   Hypothermia   CAD (coronary artery disease)   HTN (hypertension), benign   PVD (peripheral vascular disease)   Depression   Chronic back pain   Assessment: This is  a 62 year old, Caucasian female who was found unresponsive at her house and was brought in by EMS. It appears that the patient has been depressed over the last one month. So, this is most likely an intentional overdose. CT head did not show any acute intracranial process. Other differential diagnoses include stroke, sepsis (considering  her hypothermia). Reason for hypothermia is most likely environmental versus medication induced.  Plan: #1 acute encephalopathy: Most likely secondary to drug overdose. She is currently protecting her airway. She'll be monitored closely in the step down unit. Neurochecks will be performed. If mental status does not improve, she may repeat need repeat neuroimaging studies.  #2 possible intentional overdose: Suicide precautions will be employed. Once she is medically stable psychiatry will be consulted. Continue to monitor closely for adverse effects of the medications. It is unclear what she may have taken. It is likely she has taken her narcotics and benzodiazepines considering the urine drug screen. Symptomatic treatment will be provided.  #3 hypothermia: Most likely due to drug affect. Warming blankets have been applied. Monitor closely. She has been given broad-spectrum antibiotics by the ED for presumed sepsis. Blood cultures have been drawn. We'll hold off on further antibiotics for now.  #4 history of coronary artery disease: She appears to be stable from cardiac standpoint. EKG does not show any acute ischemic changes.  #5 history of chronic back pain: Monitor closely for withdrawal symptoms when she is awake.  #6 history of depression and anxiety: As mentioned above, she will need to be seen by psychiatry when she is medically stable.  #7 history of hypertension: Blood pressure is on the lower side. We will give her IV fluids. Monitor BP closely. Hold her antihypertensive medications.   DVT Prophylaxis: Enoxaparin Code Status: She is a full code Family Communication: Discussed with her son at [phone]  Disposition Plan: Unclear for now   Further management decisions will depend on results of further testing and patient's response to treatment.  [facility]  [facility] Pager [phone]  If 7PM-7AM, please contact night-coverage [email or web address] Password [facility]  12/09/2012,  6:15 PM

## 2012-12-09 NOTE — ED Notes (Addendum)
Pt reports to the ED via [facility] for eval of possible overdose and altered mental status. Pt was found on the ground where it appears she slid out of her chair. Pt has hx over overdoses. Pt has had 4 of narcan with no change. Pt responsive only to sternal rub. Pts respirations even and unlabored. 12 lead unremarkable and NSR per EMS. Pts family reports she does take pain medicine and anxiety medicine but family is not aware of what she takes. Pts O2 >90%. Snoring respirations noted. Pinpoint pupils noted. Pts family unaware of head injury, LOC, or syncope. Pt does have significant cardiac hx but family was unaware of the details. Unsure if overdose was purposeful or is SI or HI.

## 2012-12-09 NOTE — ED Notes (Signed)
Temp Foley inserted per Dr. Lorenso Courier order. Sterility maintained throughout procedure and cloudy amber urine returned. Pts still noted to be hypothermic. Dr. Lorenso Courier made aware and bair hugger placed on pt. Will continually assess.

## 2012-12-09 NOTE — ED Provider Notes (Signed)
Laboratory workup has come back unremarkable. Case is discussed with Dr. Rito Ehrlich of [facility] who agrees to admit the patient. Will on reevaluation, the patient grimaces to sternal rub.  Results for orders placed during the hospital encounter of 12/09/12  CBC      Result Value Range   WBC 6.0  4.0 - 10.5 K/uL   RBC 3.80 (*) 3.87 - 5.11 MIL/uL   Hemoglobin 13.1  12.0 - 15.0 g/dL   HCT 62.9 (*) 52.8 - 41.3 %   MCV 92.6  78.0 - 100.0 fL   MCH 34.5 (*) 26.0 - 34.0 pg   MCHC 37.2 (*) 30.0 - 36.0 g/dL   RDW 24.4  01.0 - 27.2 %   Platelets 194  150 - 400 K/uL  COMPREHENSIVE METABOLIC PANEL      Result Value Range   Sodium 140  135 - 145 mEq/L   Potassium 3.6  3.5 - 5.1 mEq/L   Chloride 105  96 - 112 mEq/L   CO2 23  19 - 32 mEq/L   Glucose, Bld 165 (*) 70 - 99 mg/dL   BUN 17  6 - 23 mg/dL   Creatinine, Ser 5.36  0.50 - 1.10 mg/dL   Calcium 8.7  8.4 - 64.4 mg/dL   Total Protein 6.0  6.0 - 8.3 g/dL   Albumin 3.3 (*) 3.5 - 5.2 g/dL   AST 18  0 - 37 U/L   ALT 9  0 - 35 U/L   Alkaline Phosphatase 72  39 - 117 U/L   Total Bilirubin 0.6  0.3 - 1.2 mg/dL   GFR calc non Af Amer 57 (*) >90 mL/min   GFR calc Af Amer 66 (*) >90 mL/min  URINALYSIS, ROUTINE W REFLEX MICROSCOPIC      Result Value Range   Color, Urine AMBER (*) YELLOW   APPearance CLOUDY (*) CLEAR   Specific Gravity, Urine 1.028  1.005 - 1.030   pH 5.5  5.0 - 8.0   Glucose, UA 250 (*) NEGATIVE mg/dL   Hgb urine dipstick SMALL (*) NEGATIVE   Bilirubin Urine SMALL (*) NEGATIVE   Ketones, ur 15 (*) NEGATIVE mg/dL   Protein, ur NEGATIVE  NEGATIVE mg/dL   Urobilinogen, UA 1.0  0.0 - 1.0 mg/dL   Nitrite NEGATIVE  NEGATIVE   Leukocytes, UA NEGATIVE  NEGATIVE  URINE RAPID DRUG SCREEN (HOSP PERFORMED)      Result Value Range   Opiates POSITIVE (*) NONE DETECTED   Cocaine NONE DETECTED  NONE DETECTED   Benzodiazepines POSITIVE (*) NONE DETECTED   Amphetamines NONE DETECTED  NONE DETECTED   Tetrahydrocannabinol NONE  DETECTED  NONE DETECTED   Barbiturates NONE DETECTED  NONE DETECTED  ACETAMINOPHEN LEVEL      Result Value Range   Acetaminophen (Tylenol), Serum <15.0  10 - 30 ug/mL  SALICYLATE LEVEL      Result Value Range   Salicylate Lvl <2.0 (*) 2.8 - 20.0 mg/dL  ETHANOL      Result Value Range   Alcohol, Ethyl (B) <11  0 - 11 mg/dL  LACTIC ACID, PLASMA      Result Value Range   Lactic Acid, Venous 2.4 (*) 0.5 - 2.2 mmol/L  URINE MICROSCOPIC-ADD ON      Result Value Range   Squamous Epithelial / LPF FEW (*) RARE   WBC, UA 0-2  <3 WBC/hpf   Bacteria, UA RARE  RARE   Casts HYALINE CASTS (*) NEGATIVE   Urine-Other MUCOUS PRESENT  POCT I-STAT 3, BLOOD GAS (G3+)      Result Value Range   pH, Arterial 7.436  7.350 - 7.450   pCO2 arterial 30.9 (*) 35.0 - 45.0 mmHg   pO2, Arterial 48.0 (*) 80.0 - 100.0 mmHg   Bicarbonate 21.4  20.0 - 24.0 mEq/L   TCO2 22  0 - 100 mmol/L   O2 Saturation 90.0     Acid-base deficit 3.0 (*) 0.0 - 2.0 mmol/L   Patient temperature 93.4 F     Collection site RADIAL, ALLEN'S TEST ACCEPTABLE     Drawn by RT     Sample type ARTERIAL     Dg Chest 1 View  12/09/2012  *RADIOLOGY REPORT*  Clinical Data: Altered mental status.  Unresponsive.  CHEST - 1 VIEW  Comparison: 10/15/2010  Findings: Prior median sternotomy.  Heart is borderline in size. Low lung volumes.  Mild peribronchial thickening.  No confluent opacities or effusions.  No acute bony abnormality.  IMPRESSION: Low lung volumes.  Mild bronchitic changes.   Original Report Authenticated By: Charlett Nose, M.D.    Ct Head Wo Contrast  12/09/2012  *RADIOLOGY REPORT*  Clinical Data: Possible overdose.  Altered mental status. Decreased responsiveness.  CT HEAD WITHOUT CONTRAST  Technique:  Contiguous axial images were obtained from the base of the skull through the vertex without contrast.  Comparison: None.  Findings: No evidence of acute infarct, acute hemorrhage, mass lesion, mass effect or hydrocephalus.  Visualized  portions of the paranasal sinuses and mastoid air cells are clear.  IMPRESSION: Negative.   Original Report Authenticated By: Leanna Battles, M.D.    Dione Booze, MD 12/09/12 [phone]

## 2012-12-09 NOTE — ED Notes (Signed)
Went to Patient room to place foley catheter.  Patient is still out of room.  Gone to Xray and CT.

## 2012-12-09 NOTE — ED Provider Notes (Signed)
History     CSN: [id number]  Arrival date & time 12/09/12  1342   First MD Initiated Contact with Patient 12/09/12 1343      Chief Complaint  Patient presents with  . Altered Mental Status    (Consider location/radiation/quality/duration/timing/severity/associated sxs/prior treatment) HPI Comments: Crystal [name] presents via EMS from home.  She was discovered in the floor in front of a chair in her home.  Her family member reported being concerned she may have overdosed on her medications because it has happened previously.  There was no reported evidence of trauma other than her being found on the floor.  She has no reported hx of seizures.  Patient is a 62 y.o. female presenting with altered mental status. The history is provided by the EMS personnel. The history is limited by the condition of the patient.  Altered Mental Status This is a new problem. Episode onset: unknown - assumed sometime this morning after 0800. The problem occurs constantly. The problem has not changed since onset.Nothing aggravates the symptoms. Nothing relieves the symptoms. Treatments tried: no response to narcan en route to the ER.    No past medical history on file.  No past surgical history on file.  No family history on file.  History  Substance Use Topics  . Smoking status: Not on file  . Smokeless tobacco: Not on file  . Alcohol Use: Not on file    OB History   No data available      Review of Systems  Unable to perform ROS: Mental status change  Psychiatric/Behavioral: Positive for altered mental status.    Allergies  Review of patient's allergies indicates not on file.  Home Medications  No current outpatient prescriptions on file.  BP 112/68  Pulse 82  Temp(Src) 94.5 F (34.7 C) (Rectal)  Resp 22  SpO2 94%  Physical Exam  Nursing note and vitals reviewed. Constitutional: She appears well-nourished.  Non-toxic appearance. She does not have a sickly appearance. She does not  appear ill. No distress. Nasal cannula in place.  Pt is obese.  Note sonorous respirations.    HENT:  Head: Normocephalic and atraumatic. Head is without abrasion, without contusion, without laceration, without right periorbital erythema and without left periorbital erythema. No trismus in the jaw.  Right Ear: External ear normal. No mastoid tenderness. No hemotympanum.  Left Ear: External ear normal. No mastoid tenderness. No hemotympanum.  Nose: Nose normal. No mucosal edema, rhinorrhea or sinus tenderness. No epistaxis. Right sinus exhibits no maxillary sinus tenderness and no frontal sinus tenderness. Left sinus exhibits no maxillary sinus tenderness and no frontal sinus tenderness.  Mouth/Throat: Uvula is midline and oropharynx is clear and moist. Mucous membranes are not pale, not dry and not cyanotic. No oropharyngeal exudate.  Eyes: Conjunctivae are normal. Pupils are equal, round, and reactive to light. Right eye exhibits no discharge. Left eye exhibits no discharge. No scleral icterus.  Pupils pinpoint   Neck: Normal range of motion. Neck supple. No JVD present. No tracheal deviation present.  Cardiovascular: Normal rate, regular rhythm, normal heart sounds and intact distal pulses.  Exam reveals no gallop and no friction rub.   No murmur heard. Pulmonary/Chest: Stridor present.  Abdominal: Soft. Bowel sounds are normal. She exhibits no distension. There is no tenderness. There is no rebound and no guarding.  Obese, protuberant   Musculoskeletal: Normal range of motion. She exhibits edema (trace). She exhibits no tenderness.  Lymphadenopathy:    She has no cervical adenopathy.  Neurological: She is unresponsive. She displays no tremor. No cranial nerve deficit. She displays no seizure activity. GCS eye subscore is 2. GCS verbal subscore is 2. GCS motor subscore is 5. She displays no Babinski's sign on the right side. She displays no Babinski's sign on the left side.  Note intact gag  reflex, no facial asymmetry   Skin: Skin is warm and dry. No rash noted. No erythema. No pallor.    ED Course  Procedures (including critical care time)  Labs Reviewed  CBC  COMPREHENSIVE METABOLIC PANEL  URINALYSIS, ROUTINE W REFLEX MICROSCOPIC  URINE RAPID DRUG SCREEN (HOSP PERFORMED)  ACETAMINOPHEN LEVEL  SALICYLATE LEVEL  ETHANOL   No results found.   No diagnosis found.   Date: 12/09/2012 @ 1256  Rate: 91 bpm  Rhythm: sinus  QRS Axis: normal  Intervals: PR prolonged  ST/T Wave abnormalities: nonspecific ST changes  Conduction Disutrbances:first-degree A-V block   Narrative Interpretation:   Old EKG Reviewed: none available      MDM  Pt presents for evaluation of mental status changes.  Her family suspects she may have overdosed on a sedative.  She had no reported response to narcan administered by EMS.  She is currently altered, only localizes to noxious stimuli, note low temperature and depressed O2 sat.  She was found on the floor next to a chair.  Will obtain a CT of the head, CXR,  CBC, CMP, lactic acid, routine tox labs, and U/A.  Her core temperature is low.  Will initiate warming measures and also obtain blood cultures and a lactic acid.  Will obtain an ABG also.   1630.  Repeat exam performed.  CT head negative for acute pathology.  CXR clear also.  Labs pending.  Pt is still maintaining her airway.  Awaiting lab results.  The etiology of her mental status changes remains unclear.  Pt signed over to Dr. Preston Fleeting pending the results of the labs.  Anticipate admission to a step-down unit or ICU.  She has continued hypothermia.  Will order sepsis protocol antibiotics also.  CRITICAL CARE Performed by: Dana Allan T   Total critical care time: 30  Critical care time was exclusive of separately billable procedures and treating other patients.  Critical care was necessary to treat or prevent imminent or life-threatening deterioration.  Critical care was time  spent personally by me on the following activities: development of treatment plan with patient and/or surrogate as well as nursing, discussions with consultants, evaluation of patient's response to treatment, examination of patient, obtaining history from patient or surrogate, ordering and performing treatments and interventions, ordering and review of laboratory studies, ordering and review of radiographic studies, pulse oximetry and re-evaluation of patient's condition.'         Tobin Chad, MD 12/09/12 1633

## 2012-12-10 ENCOUNTER — Inpatient Hospital Stay (HOSPITAL_COMMUNITY): Payer: Medicaid Other

## 2012-12-10 DIAGNOSIS — G8929 Other chronic pain: Secondary | ICD-10-CM

## 2012-12-10 DIAGNOSIS — I2510 Atherosclerotic heart disease of native coronary artery without angina pectoris: Secondary | ICD-10-CM

## 2012-12-10 DIAGNOSIS — R4182 Altered mental status, unspecified: Secondary | ICD-10-CM

## 2012-12-10 DIAGNOSIS — M549 Dorsalgia, unspecified: Secondary | ICD-10-CM

## 2012-12-10 LAB — CBC
HCT: 34.1 % — ABNORMAL LOW (ref 36.0–46.0)
Hemoglobin: 12.4 g/dL (ref 12.0–15.0)
MCH: 34 pg (ref 26.0–34.0)
MCHC: 36.4 g/dL — ABNORMAL HIGH (ref 30.0–36.0)
MCV: 93.4 fL (ref 78.0–100.0)
Platelets: 188 10*3/uL (ref 150–400)
RBC: 3.65 MIL/uL — ABNORMAL LOW (ref 3.87–5.11)
RDW: 14.2 % (ref 11.5–15.5)
WBC: 8.8 10*3/uL (ref 4.0–10.5)

## 2012-12-10 LAB — COMPREHENSIVE METABOLIC PANEL
ALT: 8 U/L (ref 0–35)
AST: 21 U/L (ref 0–37)
Albumin: 2.8 g/dL — ABNORMAL LOW (ref 3.5–5.2)
Alkaline Phosphatase: 60 U/L (ref 39–117)
BUN: 16 mg/dL (ref 6–23)
CO2: 22 mEq/L (ref 19–32)
Calcium: 8.3 mg/dL — ABNORMAL LOW (ref 8.4–10.5)
Chloride: 110 mEq/L (ref 96–112)
Creatinine, Ser: 1.01 mg/dL (ref 0.50–1.10)
GFR calc Af Amer: 68 mL/min — ABNORMAL LOW (ref >90)
GFR calc non Af Amer: 58 mL/min — ABNORMAL LOW (ref >90)
Glucose, Bld: 101 mg/dL — ABNORMAL HIGH (ref 70–99)
Potassium: 3.5 mEq/L (ref 3.5–5.1)
Sodium: 143 mEq/L (ref 135–145)
Total Bilirubin: 0.6 mg/dL (ref 0.3–1.2)
Total Protein: 5.4 g/dL — ABNORMAL LOW (ref 6.0–8.3)

## 2012-12-10 LAB — GLUCOSE, CAPILLARY: Glucose-Capillary: 102 mg/dL — ABNORMAL HIGH (ref 70–99)

## 2012-12-10 LAB — TSH: TSH: 0.517 u[IU]/mL (ref 0.350–4.500)

## 2012-12-10 MED ORDER — BUPROPION HCL ER (XL) 300 MG PO TB24
300.0000 mg | ORAL_TABLET | Freq: Every day | ORAL | Status: DC
  Administered 2012-12-10: 300 mg via ORAL
  Filled 2012-12-10 (×2): qty 1

## 2012-12-10 MED ORDER — CYCLOBENZAPRINE HCL 10 MG PO TABS
10.0000 mg | ORAL_TABLET | Freq: Two times a day (BID) | ORAL | Status: DC | PRN
  Administered 2012-12-11: 10 mg via ORAL
  Filled 2012-12-10: qty 1

## 2012-12-10 MED ORDER — ATORVASTATIN CALCIUM 80 MG PO TABS
80.0000 mg | ORAL_TABLET | Freq: Every day | ORAL | Status: DC
  Administered 2012-12-10 – 2012-12-12 (×3): 80 mg via ORAL
  Filled 2012-12-10 (×3): qty 1

## 2012-12-10 MED ORDER — PANTOPRAZOLE SODIUM 40 MG PO TBEC
40.0000 mg | DELAYED_RELEASE_TABLET | Freq: Every day | ORAL | Status: DC
  Administered 2012-12-10 – 2012-12-12 (×3): 40 mg via ORAL
  Filled 2012-12-10 (×2): qty 1

## 2012-12-10 MED ORDER — NABUMETONE 500 MG PO TABS
1000.0000 mg | ORAL_TABLET | Freq: Two times a day (BID) | ORAL | Status: DC
  Administered 2012-12-10 – 2012-12-12 (×5): 1000 mg via ORAL
  Filled 2012-12-10 (×6): qty 2

## 2012-12-10 MED ORDER — DULOXETINE HCL 60 MG PO CPEP
120.0000 mg | ORAL_CAPSULE | Freq: Every day | ORAL | Status: DC
  Administered 2012-12-10: 120 mg via ORAL
  Filled 2012-12-10 (×2): qty 2

## 2012-12-10 MED ORDER — ISOSORBIDE MONONITRATE ER 60 MG PO TB24
60.0000 mg | ORAL_TABLET | Freq: Every day | ORAL | Status: DC
  Administered 2012-12-10 – 2012-12-12 (×3): 60 mg via ORAL
  Filled 2012-12-10 (×3): qty 1

## 2012-12-10 MED ORDER — ALPRAZOLAM 0.25 MG PO TABS: 0.2500 mg | ORAL_TABLET | Freq: Two times a day (BID) | ORAL | Status: DC | PRN

## 2012-12-10 NOTE — Progress Notes (Signed)
Patient is oriented to place, time, and self but remains lethargic. Spoke with patient's eldest son, Crystal [name], per patient's verbal permission. Pt's son is uncertain of the patient's exact medications but will try to provide a full list to unit 6700 staff later tonight or tomorrow morning. He states that the patient has a history of occasionally taking her meds twice, when she thinks she hasn't taken them yet.  He states her medication organization system at home is "confusing" and leads to the patient duplicating medication intake.  He believes that last night (12/09/12) she "probably took a xanax and a oxycodone on an empty stomach."  He also states that she does not consume alcohol on a regular basis and does not use recreational drugs.  This information will be relayed to the night shift nurse and the M.D. on-call.

## 2012-12-10 NOTE — Progress Notes (Signed)
Utilization review completed.  

## 2012-12-10 NOTE — Progress Notes (Signed)
Pt admitted to unit 6700 from 3300, received report from [place], [place]. Pt is lethargic, oriented to self and location only, all VS's WNL, no signs of distress. Patient made familiar with staff, call bell, and room. Bed in lowest position. Call bell within reach. Safety sitter at patient's bedside. Full assessment to Epic. Will continue to monitor. Fayne Norrie, RN

## 2012-12-10 NOTE — Progress Notes (Signed)
Report called to [facility], receiving RN on 6700.  Pt transferred to 6727 via wheelchair with all belongings and suicide sitter. Attempted to call pt's daughter, Cyndia Skeeters, with new room number, but no answer.   Roselie Awkward, RN

## 2012-12-10 NOTE — Consult Note (Signed)
Patient Identification:  Crystal [name] Date of Evaluation:  12/10/2012 Reason for Consult:Suicide Attempt  Referring Provider: Dr. Lavera Guise  History of Present Illness:Crystal [name] is a 62 y.o. female with a past medical history of coronary artery disease with stent placements in the past, depression, anxiety, chronic back pain, hypertension, peripheral vascular disease, who was found on the floor of her house. She was found by her family members. EMS was called and the patient was brought into the hospital. Initially, pinpoint pupils were noted. She was given Narcan without any appreciable change. Patient's family is unaware of any head injuries. No recent illness. I did speak with the patient's son at [phone]. He mentioned that over the past 1 month patient has been very depressed. She has made statements stating that she wanted to end her life. So, he thinks that patient may have taken more medication than she should have.  Past Psychiatric History: She denies any psychiatric treatment.  Her meds came from PCP.  She has never made a prior attempt of suicide.  She has    Past Medical History:     Past Medical History  Diagnosis Date  . Osteoarthritis   . Osteopenia   . Pseudodementia   . Diabetes mellitus without complication   . Chronic ischemic heart disease   . GERD (gastroesophageal reflux disease)   . Depression   . Anxiety   . Back pain, chronic   . Hypertension   . Hypercholesteremia        Past Surgical History  Procedure Laterality Date  . Tubal ligation      Allergies:  Allergies  Allergen Reactions  . Penicillins     Unknown   . Sulfa Antibiotics     unknown    Current Medications:  Prior to Admission medications   Medication Sig Start Date End Date Taking? Authorizing Provider  albuterol (PROVENTIL HFA;VENTOLIN HFA) 108 (90 BASE) MCG/ACT inhaler Inhale 2 puffs into the lungs every 6 (six) hours as needed for wheezing.    Yes Historical Provider, MD   ALPRAZolam Prudy Feeler) 1 MG tablet Take 1 mg by mouth 2 (two) times daily as needed for sleep. For acute anxiety   Yes Historical Provider, MD  atorvastatin (LIPITOR) 80 MG tablet Take 80 mg by mouth daily.   Yes Historical Provider, MD  buPROPion (WELLBUTRIN XL) 300 MG 24 hr tablet Take 300 mg by mouth daily.   Yes Historical Provider, MD  cilostazol (PLETAL) 50 MG tablet Take 50 mg by mouth 2 (two) times daily.   Yes Historical Provider, MD  cyclobenzaprine (FLEXERIL) 10 MG tablet Take 10 mg by mouth 2 (two) times daily as needed for muscle spasms. For neck and muscle pain   Yes Historical Provider, MD  DULoxetine (CYMBALTA) 60 MG capsule Take 120 mg by mouth daily.   Yes Historical Provider, MD  esomeprazole (NEXIUM) 40 MG capsule Take 40 mg by mouth daily before breakfast.   Yes Historical Provider, MD  ezetimibe (ZETIA) 10 MG tablet Take 10 mg by mouth daily.   Yes Historical Provider, MD  HYDROcodone-acetaminophen (NORCO/VICODIN) 5-325 MG per tablet Take 1 tablet by mouth 2 (two) times daily as needed (for back pain).   Yes Historical Provider, MD  isosorbide mononitrate (IMDUR) 60 MG 24 hr tablet Take 60 mg by mouth daily.   Yes Historical Provider, MD  meclizine (ANTIVERT) 25 MG tablet Take 25 mg by mouth 3 (three) times daily as needed for dizziness or nausea. For dizziness and  nausea   Yes Historical Provider, MD  nabumetone (RELAFEN) 500 MG tablet Take 1,000 mg by mouth 2 (two) times daily.   Yes Historical Provider, MD  nitroGLYCERIN (NITROSTAT) 0.4 MG SL tablet Place 0.4 mg under the tongue every 5 (five) minutes as needed for chest pain. x3 doses as needed for chest pain   Yes Historical Provider, MD    Social History:    has no tobacco, alcohol, and drug history on file.   Family History:    No family history on file.  Mental Status Examination/Evaluation: Objective:  Appearance: Disheveled  Eye Contact::  Minimal  Speech:  Garbled  Volume:  Decreased  Mood:  confused   Affect:  Flat  Thought Process:  Disorganized  Orientation:  NA  Thought Content:  unable to participate in questions  Suicidal Thoughts:  No  Homicidal Thoughts:  No  Judgement:  Impaired  Insight:  Lacking   DIAGNOSIS:   AXIS I  Apparant suicide attempt, Pt is either delirious or under the influence of medication[s] she took.   AXIS II  Deferred  AXIS III See medical notes.  AXIS IV other psychosocial or environmental problems, problems related to social environment, problems with primary support group and Pt is unable to state the problem or situation  AXIS V 41-50 serious symptoms   Assessment/Plan:  Discussed with Dr. Lavera Guise,  Psych CSW Pt is awake with family member Jonny Ruiz and GF present.  She has been talking with them [?]  When alone, She is edentulous, has no eye contact and mumbles in a lower than normal volume.  Family had tried to determine what medication she has at home, but has no idea what she may have taken: Cymbalta, Oxycontin, Isosorbide, Norvasc, Plavix, Toprol  This may no be all her medications.    She was found lying on the kitchen floor.  That is all son knows. Toxicology screen is positive for benzodiazepines and opiates; negative for alcohol.  EKG - very high QTc nearly 500 She needs to be seen and anticipate she will have a clearer sensorium. RECOMMENDATION:  1.  Pt does not have capacity today.  2.  Continue to inquire, continue to evaluate Preslei Blakley MD 12/10/2012 5:28 PM

## 2012-12-10 NOTE — Progress Notes (Signed)
[facility] PROGRESS NOTE  Crystal [name] NWG:[id number] DOB: [birth year] DOA: 12/09/2012 PCP: Ailene Ravel, MD  Assessment/Plan: #1 acute encephalopathy: Most likely secondary to drug overdose. She was  monitored closely in the step down unit initially . She improved by HOD no 2 and was moved to the floor.   #2 possible intentional overdose: Suicide precautions in place. Continue to monitor closely for adverse effects of the medications. It is unclear what she may have taken. It is likely she has taken her narcotics and benzodiazepines considering the urine drug screen.    #3 hypothermia: Most likely due to drug affect. Warming blankets have been applied. Monitor closely. She has been given broad-spectrum antibiotics by the ED for presumed sepsis. Blood cultures have been drawn. Resolved by 12/10/12 . TSH 0.517  #4 history of coronary artery disease: She appears to be stable from cardiac standpoint. EKG does not show any acute ischemic changes.   #5 history of chronic back pain: Monitor closely for withdrawal symptoms when she is awake. Resumed nabumetone 12/10/12   #6 history of depression and anxiety: As mentioned above, she will need to be seen by psychiatry when she is medically stable.  Resumed wellbutrin and cymbalta 12/10/12       Code Status: full  Family Communication: ex husband  Disposition Plan: home    Consultants:  Psychiatry  HPI/Subjective: Lethargic but able to stay awake for interview. Unable to tell me what she took yesterday. Denies suicidal ideations.   Objective: Filed Vitals:   12/10/12 0350 12/10/12 0800 12/10/12 1204 12/10/12 1420  BP: 110/47 105/51 120/68 124/59  Pulse: 98 102 101 102  Temp: 98 F (36.7 C) 98.7 F (37.1 C) 98.3 F (36.8 C) 98.8 F (37.1 C)  TempSrc: Axillary Oral Oral Oral  Resp: 23 25 25 22   Height:      Weight:      SpO2: 94% 95% 93% 95%   Patient Vitals for the past 24 hrs:  BP Temp Temp src Pulse Resp SpO2 Height  Weight  12/10/12 1420 124/59 mmHg 98.8 F (37.1 C) Oral 102 22 95 % - -  12/10/12 1204 120/68 mmHg 98.3 F (36.8 C) Oral 101 25 93 % - -  12/10/12 0800 105/51 mmHg 98.7 F (37.1 C) Oral 102 25 95 % - -  12/10/12 0350 110/47 mmHg 98 F (36.7 C) Axillary 98 23 94 % - -  12/09/12 2325 119/47 mmHg 98.3 F (36.8 C) Oral 95 26 96 % - -  12/09/12 2046 103/52 mmHg 98.4 F (36.9 C) Rectal 93 - 98 % 5\' 3"  (1.6 m) 74.3 kg (163 lb 12.8 oz)  12/09/12 2000 119/65 mmHg 97.5 F (36.4 C) - 89 27 100 % - -  12/09/12 1945 115/67 mmHg 97.2 F (36.2 C) - 87 26 100 % - -  12/09/12 1930 119/69 mmHg 96.8 F (36 C) - 88 24 100 % - -  12/09/12 1915 111/62 mmHg 96.6 F (35.9 C) - 85 28 98 % - -  12/09/12 1900 110/60 mmHg 96.3 F (35.7 C) - 83 25 100 % - -  12/09/12 1845 109/61 mmHg 96.1 F (35.6 C) - 84 27 99 % - -  12/09/12 1830 107/71 mmHg 95.7 F (35.4 C) - 88 25 99 % - -  12/09/12 1815 117/73 mmHg 95.5 F (35.3 C) - 88 18 100 % - -  12/09/12 1800 111/68 mmHg 95.2 F (35.1 C) - 81 25 93 % - -  12/09/12 1745  106/66 mmHg 95 F (35 C) - 80 25 99 % - -  12/09/12 1715 93/51 mmHg 94.5 F (34.7 C) - 76 23 100 % - -  12/09/12 1700 98/56 mmHg 94.3 F (34.6 C) - 74 23 100 % - -  12/09/12 1645 119/67 mmHg 94.1 F (34.5 C) - 77 20 99 % - -     Intake/Output Summary (Last 24 hours) at 12/10/12 1641 Last data filed at 12/10/12 1300  Gross per 24 hour  Intake 988.33 ml  Output    900 ml  Net  88.33 ml   Filed Weights   12/09/12 2046  Weight: 74.3 kg (163 lb 12.8 oz)    Exam:   General:  Alert , oriented to self  Cardiovascular: tachy, reg, no m  Respiratory: ctab   Abdomen: soft, nt   Musculoskeletal: intact, left shoulder tender    Data Reviewed: Basic Metabolic Panel:  Recent Labs Lab 12/09/12 1556 12/10/12 0435  NA 140 143  K 3.6 3.5  CL 105 110  CO2 23 22  GLUCOSE 165* 101*  BUN 17 16  CREATININE 1.03 1.01  CALCIUM 8.7 8.3*   Liver Function Tests:  Recent Labs Lab  12/09/12 1556 12/10/12 0435  AST 18 21  ALT 9 8  ALKPHOS 72 60  BILITOT 0.6 0.6  PROT 6.0 5.4*  ALBUMIN 3.3* 2.8*   No results found for this basename: LIPASE, AMYLASE,  in the last 168 hours No results found for this basename: AMMONIA,  in the last 168 hours CBC:  Recent Labs Lab 12/09/12 1556 12/10/12 0435  WBC 6.0 8.8  HGB 13.1 12.4  HCT 35.2* 34.1*  MCV 92.6 93.4  PLT 194 188   Cardiac Enzymes: No results found for this basename: CKTOTAL, CKMB, CKMBINDEX, TROPONINI,  in the last 168 hours BNP (last 3 results) No results found for this basename: PROBNP,  in the last 8760 hours CBG:  Recent Labs Lab 12/09/12 2241  GLUCAP 102*    Recent Results (from the past 240 hour(s))  CULTURE, BLOOD (ROUTINE X 2)     Status: None   Collection Time    12/09/12  3:50 PM      Result Value Range Status   Specimen Description BLOOD RIGHT ANTECUBITAL   Final   Special Requests BOTTLES DRAWN AEROBIC ONLY 10CC   Final   Culture  Setup Time 12/09/2012 22:44   Final   Culture     Final   Value:        BLOOD CULTURE RECEIVED NO GROWTH TO DATE CULTURE WILL BE HELD FOR 5 DAYS BEFORE ISSUING A FINAL NEGATIVE REPORT   Report Status PENDING   Incomplete  CULTURE, BLOOD (ROUTINE X 2)     Status: None   Collection Time    12/09/12  4:00 PM      Result Value Range Status   Specimen Description BLOOD HAND RIGHT   Final   Special Requests BOTTLES DRAWN AEROBIC ONLY 10CC   Final   Culture  Setup Time 12/09/2012 22:44   Final   Culture     Final   Value:        BLOOD CULTURE RECEIVED NO GROWTH TO DATE CULTURE WILL BE HELD FOR 5 DAYS BEFORE ISSUING A FINAL NEGATIVE REPORT   Report Status PENDING   Incomplete     Studies: Dg Chest 1 View  12/09/2012  *RADIOLOGY REPORT*  Clinical Data: Altered mental status.  Unresponsive.  CHEST - 1 VIEW  Comparison: 10/15/2010  Findings: Prior median sternotomy.  Heart is borderline in size. Low lung volumes.  Mild peribronchial thickening.  No confluent  opacities or effusions.  No acute bony abnormality.  IMPRESSION: Low lung volumes.  Mild bronchitic changes.   Original Report Authenticated By: Charlett Nose, M.D.    Ct Head Wo Contrast  12/09/2012  *RADIOLOGY REPORT*  Clinical Data: Possible overdose.  Altered mental status. Decreased responsiveness.  CT HEAD WITHOUT CONTRAST  Technique:  Contiguous axial images were obtained from the base of the skull through the vertex without contrast.  Comparison: None.  Findings: No evidence of acute infarct, acute hemorrhage, mass lesion, mass effect or hydrocephalus.  Visualized portions of the paranasal sinuses and mastoid air cells are clear.  IMPRESSION: Negative.   Original Report Authenticated By: Leanna Battles, M.D.    Dg Shoulder Left Port  12/10/2012  *RADIOLOGY REPORT*  Clinical Data: Left shoulder pain and limited range of motion since a fall yesterday.  PORTABLE LEFT SHOULDER - 2+ VIEW  Comparison: None.  Findings: There is no fracture, dislocation, or arthritis.  IMPRESSION: Normal exam.   Original Report Authenticated By: Francene Boyers, M.D.     Scheduled Meds: . atorvastatin  80 mg Oral Daily  . buPROPion  300 mg Oral Daily  . DULoxetine  120 mg Oral Daily  . enoxaparin (LOVENOX) injection  40 mg Subcutaneous Q24H  . isosorbide mononitrate  60 mg Oral Daily  . nabumetone  1,000 mg Oral BID  . pantoprazole  40 mg Oral Daily  . sodium chloride  3 mL Intravenous Q12H  . thiamine  100 mg Intravenous Daily   Continuous Infusions:    Principal Problem:   Acute encephalopathy Active Problems:   Hypothermia   CAD (coronary artery disease)   HTN (hypertension), benign   PVD (peripheral vascular disease)   Depression   Chronic back pain     Aceton Kinnear  [facility] Pager [phone]. If 7PM-7AM, please contact night-coverage at [email or web address], password [facility] 12/10/2012, 4:41 PM  LOS: 1 day

## 2012-12-11 ENCOUNTER — Encounter (HOSPITAL_COMMUNITY): Payer: Self-pay | Admitting: Emergency Medicine

## 2012-12-11 LAB — BASIC METABOLIC PANEL
BUN: 12 mg/dL (ref 6–23)
CO2: 22 mEq/L (ref 19–32)
Calcium: 8.5 mg/dL (ref 8.4–10.5)
Chloride: 109 mEq/L (ref 96–112)
Creatinine, Ser: 1.19 mg/dL — ABNORMAL HIGH (ref 0.50–1.10)
GFR calc Af Amer: 56 mL/min — ABNORMAL LOW (ref >90)
GFR calc non Af Amer: 48 mL/min — ABNORMAL LOW (ref >90)
Glucose, Bld: 125 mg/dL — ABNORMAL HIGH (ref 70–99)
Potassium: 3.3 mEq/L — ABNORMAL LOW (ref 3.5–5.1)
Sodium: 142 mEq/L (ref 135–145)

## 2012-12-11 LAB — CBC
HCT: 30.8 % — ABNORMAL LOW (ref 36.0–46.0)
Hemoglobin: 11.3 g/dL — ABNORMAL LOW (ref 12.0–15.0)
MCH: 34.5 pg — ABNORMAL HIGH (ref 26.0–34.0)
MCHC: 36.7 g/dL — ABNORMAL HIGH (ref 30.0–36.0)
MCV: 93.9 fL (ref 78.0–100.0)
Platelets: 185 10*3/uL (ref 150–400)
RBC: 3.28 MIL/uL — ABNORMAL LOW (ref 3.87–5.11)
RDW: 14.6 % (ref 11.5–15.5)
WBC: 6.1 10*3/uL (ref 4.0–10.5)

## 2012-12-11 MED ORDER — WHITE PETROLATUM GEL
Status: AC
  Administered 2012-12-11: 0.2
  Filled 2012-12-11: qty 5

## 2012-12-11 MED ORDER — DULOXETINE HCL 60 MG PO CPEP
60.0000 mg | ORAL_CAPSULE | Freq: Every day | ORAL | Status: DC
  Administered 2012-12-11 – 2012-12-12 (×2): 60 mg via ORAL
  Filled 2012-12-11 (×2): qty 1

## 2012-12-11 MED ORDER — BUPROPION HCL ER (XL) 150 MG PO TB24
150.0000 mg | ORAL_TABLET | Freq: Every day | ORAL | Status: DC
  Administered 2012-12-11 – 2012-12-12 (×2): 150 mg via ORAL
  Filled 2012-12-11 (×2): qty 1

## 2012-12-11 MED ORDER — VITAMIN B-1 100 MG PO TABS
100.0000 mg | ORAL_TABLET | Freq: Every day | ORAL | Status: DC
  Administered 2012-12-12: 100 mg via ORAL
  Filled 2012-12-11 (×2): qty 1

## 2012-12-11 NOTE — Progress Notes (Signed)
[facility] PROGRESS NOTE  Crystal [name] WUJ:[id number] DOB: [birth year] DOA: 12/09/2012 PCP: Ailene Ravel, MD  Assessment/Plan: #1 acute encephalopathy: Most likely secondary to drug overdose. She was  monitored closely in the step down unit initially . She improved by HOD no 2 and was moved to the floor. By HOD #3 the patient's encephalopathy has resolved completely   #2 possible intentional overdose: Suicide precautions in place. Continue to monitor closely for adverse effects of the medications. It is unclear what she may have taken. It is likely she has taken her narcotics and benzodiazepines considering the urine drug screen.  . Patient denies suicidal ideations. When asked if she has tried to kill herself before she denies it, interesting enough her children in the room passed me a note that said that she has tried to kill herself before.   #3 hypothermia: Most likely due to drug affect. Warming blankets have been applied. She has been given broad-spectrum antibiotics by the ED for presumed sepsis. Blood cultures have been drawn. Resolved by 12/10/12 . TSH 0.517. No signs of infection   #4 history of coronary artery disease: She appears to be stable from cardiac standpoint. EKG does not show any acute ischemic changes.   #5 history of chronic back pain: Monitor closely for withdrawal symptoms when she is awake. Resumed nabumetone 12/10/12   #6 history of depression and anxiety: As mentioned above, she will need to be seen by psychiatry when she is medically stable.  Resumed wellbutrin and cymbalta 12/10/12 at home dose  Decreased dose of wellbutrin and cymbalta to 150 respectively 60 on 12/11/13 due to persistent tachycardia and [place] QTc         Code Status: full  Family Communication: children at bedside  Disposition Plan: home    Consultants:  Psychiatry  HPI/Subjective: Wide awake - has no recollection of yesterday or the day before. Last date she remembers is  3/10  Objective: Filed Vitals:   12/10/12 1204 12/10/12 1420 12/10/12 2133 12/11/12 0432  BP: 120/68 124/59 118/53 99/51  Pulse: 101 102 89 82  Temp: 98.3 F (36.8 C) 98.8 F (37.1 C) 97.8 F (36.6 C) 97.9 F (36.6 C)  TempSrc: Oral Oral Oral Oral  Resp: 25 22 21 22   Height:   5\' 3"  (1.6 m)   Weight:   74.4 kg (164 lb 0.4 oz)   SpO2: 93% 95% 100% 94%   Patient Vitals for the past 24 hrs:  BP Temp Temp src Pulse Resp SpO2 Height Weight  12/11/12 0432 99/51 mmHg 97.9 F (36.6 C) Oral 82 22 94 % - -  12/10/12 2133 118/53 mmHg 97.8 F (36.6 C) Oral 89 21 100 % 5\' 3"  (1.6 m) 74.4 kg (164 lb 0.4 oz)  12/10/12 1420 124/59 mmHg 98.8 F (37.1 C) Oral 102 22 95 % - -  12/10/12 1204 120/68 mmHg 98.3 F (36.8 C) Oral 101 25 93 % - -     Intake/Output Summary (Last 24 hours) at 12/11/12 0810 Last data filed at 12/11/12 0432  Gross per 24 hour  Intake    600 ml  Output   1475 ml  Net   -875 ml   Filed Weights   12/09/12 2046 12/10/12 2133  Weight: 74.3 kg (163 lb 12.8 oz) 74.4 kg (164 lb 0.4 oz)    Exam:   General:  Alert , oriented to self  Cardiovascular: tachy, reg, no m  Respiratory: ctab   Abdomen: soft, nt   Musculoskeletal:  intact, left shoulder tender    Data Reviewed: Basic Metabolic Panel:  Recent Labs Lab 12/09/12 1556 12/10/12 0435  NA 140 143  K 3.6 3.5  CL 105 110  CO2 23 22  GLUCOSE 165* 101*  BUN 17 16  CREATININE 1.03 1.01  CALCIUM 8.7 8.3*   Liver Function Tests:  Recent Labs Lab 12/09/12 1556 12/10/12 0435  AST 18 21  ALT 9 8  ALKPHOS 72 60  BILITOT 0.6 0.6  PROT 6.0 5.4*  ALBUMIN 3.3* 2.8*   No results found for this basename: LIPASE, AMYLASE,  in the last 168 hours No results found for this basename: AMMONIA,  in the last 168 hours CBC:  Recent Labs Lab 12/09/12 1556 12/10/12 0435  WBC 6.0 8.8  HGB 13.1 12.4  HCT 35.2* 34.1*  MCV 92.6 93.4  PLT 194 188   Cardiac Enzymes: No results found for this basename:  CKTOTAL, CKMB, CKMBINDEX, TROPONINI,  in the last 168 hours BNP (last 3 results) No results found for this basename: PROBNP,  in the last 8760 hours CBG:  Recent Labs Lab 12/09/12 2241  GLUCAP 102*    Recent Results (from the past 240 hour(s))  CULTURE, BLOOD (ROUTINE X 2)     Status: None   Collection Time    12/09/12  3:50 PM      Result Value Range Status   Specimen Description BLOOD RIGHT ANTECUBITAL   Final   Special Requests BOTTLES DRAWN AEROBIC ONLY 10CC   Final   Culture  Setup Time 12/09/2012 22:44   Final   Culture     Final   Value:        BLOOD CULTURE RECEIVED NO GROWTH TO DATE CULTURE WILL BE HELD FOR 5 DAYS BEFORE ISSUING A FINAL NEGATIVE REPORT   Report Status PENDING   Incomplete  CULTURE, BLOOD (ROUTINE X 2)     Status: None   Collection Time    12/09/12  4:00 PM      Result Value Range Status   Specimen Description BLOOD HAND RIGHT   Final   Special Requests BOTTLES DRAWN AEROBIC ONLY 10CC   Final   Culture  Setup Time 12/09/2012 22:44   Final   Culture     Final   Value:        BLOOD CULTURE RECEIVED NO GROWTH TO DATE CULTURE WILL BE HELD FOR 5 DAYS BEFORE ISSUING A FINAL NEGATIVE REPORT   Report Status PENDING   Incomplete     Studies: Dg Chest 1 View  12/09/2012  *RADIOLOGY REPORT*  Clinical Data: Altered mental status.  Unresponsive.  CHEST - 1 VIEW  Comparison: 10/15/2010  Findings: Prior median sternotomy.  Heart is borderline in size. Low lung volumes.  Mild peribronchial thickening.  No confluent opacities or effusions.  No acute bony abnormality.  IMPRESSION: Low lung volumes.  Mild bronchitic changes.   Original Report Authenticated By: Charlett Nose, M.D.    Ct Head Wo Contrast  12/09/2012  *RADIOLOGY REPORT*  Clinical Data: Possible overdose.  Altered mental status. Decreased responsiveness.  CT HEAD WITHOUT CONTRAST  Technique:  Contiguous axial images were obtained from the base of the skull through the vertex without contrast.  Comparison: None.   Findings: No evidence of acute infarct, acute hemorrhage, mass lesion, mass effect or hydrocephalus.  Visualized portions of the paranasal sinuses and mastoid air cells are clear.  IMPRESSION: Negative.   Original Report Authenticated By: Leanna Battles, M.D.    Dg Shoulder  Left Port  12/10/2012  *RADIOLOGY REPORT*  Clinical Data: Left shoulder pain and limited range of motion since a fall yesterday.  PORTABLE LEFT SHOULDER - 2+ VIEW  Comparison: None.  Findings: There is no fracture, dislocation, or arthritis.  IMPRESSION: Normal exam.   Original Report Authenticated By: Francene Boyers, M.D.     Scheduled Meds: . atorvastatin  80 mg Oral Daily  . buPROPion  300 mg Oral Daily  . DULoxetine  60 mg Oral Daily  . enoxaparin (LOVENOX) injection  40 mg Subcutaneous Q24H  . isosorbide mononitrate  60 mg Oral Daily  . nabumetone  1,000 mg Oral BID  . pantoprazole  40 mg Oral Daily  . sodium chloride  3 mL Intravenous Q12H  . thiamine  100 mg Intravenous Daily   Continuous Infusions:    Principal Problem:   Acute encephalopathy Active Problems:   Hypothermia   CAD (coronary artery disease)   HTN (hypertension), benign   PVD (peripheral vascular disease)   Depression   Chronic back pain     Crystal [name]  [facility] Pager [phone]. If 7PM-7AM, please contact night-coverage at [email or web address], password [facility] 12/11/2012, 8:10 AM  LOS: 2 days

## 2012-12-11 NOTE — Consult Note (Signed)
Reason for Consult: OD? Referring Physician: unknown  Crystal [name] is an 62 y.o. female.  HPI:   Crystal [name] is a 62 y.o. female with a past medical history of coronary artery disease with stent placements in the past, depression, anxiety, chronic back pain, hypertension, peripheral vascular disease, who was found on the floor of her house. She was found by her family members. EMS was called and the patient was brought into the hospital.   Per pt this was an accident and no SI attempt. Reports conflict with ex husand with who she lives now. Per pt she is not depressed but just upset with him. Reports psy meds are helping her right now. Not interested to changed much. Feels safe going home now. Family (daughter in law) also feels safe if she goes home. Per family ex husband coming to see her these days and she is going to stay with him. Per pt she took xanax an opiods together and that made her unstable. Per family she talks about her stressors but NO specific SI thoughts in the past.   Past Medical History  Diagnosis Date  . Osteoarthritis   . Osteopenia   . Pseudodementia   . Diabetes mellitus without complication   . Chronic ischemic heart disease   . GERD (gastroesophageal reflux disease)   . Depression   . Anxiety   . Back pain, chronic   . Hypertension   . Hypercholesteremia     Past Surgical History  Procedure Laterality Date  . Tubal ligation      History reviewed. No pertinent family history.  Social History:  reports that she has never smoked. She has never used smokeless tobacco. She reports that she does not drink alcohol or use illicit drugs.  Allergies:  Allergies  Allergen Reactions  . Penicillins     Unknown   . Sulfa Antibiotics     unknown    Medications: I have reviewed the patient's current medications.  Results for orders placed during the hospital encounter of 12/09/12 (from the past 48 hour(s))  GLUCOSE, CAPILLARY     Status: Abnormal   Collection Time    12/09/12 10:41 PM      Result Value Range   Glucose-Capillary 102 (*) 70 - 99 mg/dL   Comment 1 Notify RN     Comment 2 Documented in Chart    COMPREHENSIVE METABOLIC PANEL     Status: Abnormal   Collection Time    12/10/12  4:35 AM      Result Value Range   Sodium 143  135 - 145 mEq/L   Potassium 3.5  3.5 - 5.1 mEq/L   Chloride 110  96 - 112 mEq/L   CO2 22  19 - 32 mEq/L   Glucose, Bld 101 (*) 70 - 99 mg/dL   BUN 16  6 - 23 mg/dL   Creatinine, Ser 1.47  0.50 - 1.10 mg/dL   Calcium 8.3 (*) 8.4 - 10.5 mg/dL   Total Protein 5.4 (*) 6.0 - 8.3 g/dL   Albumin 2.8 (*) 3.5 - 5.2 g/dL   AST 21  0 - 37 U/L   ALT 8  0 - 35 U/L   Alkaline Phosphatase 60  39 - 117 U/L   Total Bilirubin 0.6  0.3 - 1.2 mg/dL   GFR calc non Af Amer 58 (*) >90 mL/min   GFR calc Af Amer 68 (*) >90 mL/min   Comment:  The eGFR has been calculated     using the CKD EPI equation.     This calculation has not been     validated in all clinical     situations.     eGFR's persistently     <90 mL/min signify     possible Chronic Kidney Disease.  CBC     Status: Abnormal   Collection Time    12/10/12  4:35 AM      Result Value Range   WBC 8.8  4.0 - 10.5 K/uL   RBC 3.65 (*) 3.87 - 5.11 MIL/uL   Hemoglobin 12.4  12.0 - 15.0 g/dL   HCT 16.1 (*) 09.6 - 04.5 %   MCV 93.4  78.0 - 100.0 fL   MCH 34.0  26.0 - 34.0 pg   MCHC 36.4 (*) 30.0 - 36.0 g/dL   RDW 40.9  81.1 - 91.4 %   Platelets 188  150 - 400 K/uL  BASIC METABOLIC PANEL     Status: Abnormal   Collection Time    12/11/12  9:24 AM      Result Value Range   Sodium 142  135 - 145 mEq/L   Potassium 3.3 (*) 3.5 - 5.1 mEq/L   Chloride 109  96 - 112 mEq/L   CO2 22  19 - 32 mEq/L   Glucose, Bld 125 (*) 70 - 99 mg/dL   BUN 12  6 - 23 mg/dL   Creatinine, Ser 7.82 (*) 0.50 - 1.10 mg/dL   Calcium 8.5  8.4 - 95.6 mg/dL   GFR calc non Af Amer 48 (*) >90 mL/min   GFR calc Af Amer 56 (*) >90 mL/min   Comment:            The eGFR has  been calculated     using the CKD EPI equation.     This calculation has not been     validated in all clinical     situations.     eGFR's persistently     <90 mL/min signify     possible Chronic Kidney Disease.  CBC     Status: Abnormal   Collection Time    12/11/12  9:24 AM      Result Value Range   WBC 6.1  4.0 - 10.5 K/uL   RBC 3.28 (*) 3.87 - 5.11 MIL/uL   Hemoglobin 11.3 (*) 12.0 - 15.0 g/dL   HCT 21.3 (*) 08.6 - 57.8 %   MCV 93.9  78.0 - 100.0 fL   MCH 34.5 (*) 26.0 - 34.0 pg   MCHC 36.7 (*) 30.0 - 36.0 g/dL   RDW 46.9  62.9 - 52.8 %   Platelets 185  150 - 400 K/uL    Dg Shoulder Left Port  12/10/2012  *RADIOLOGY REPORT*  Clinical Data: Left shoulder pain and limited range of motion since a fall yesterday.  PORTABLE LEFT SHOULDER - 2+ VIEW  Comparison: None.  Findings: There is no fracture, dislocation, or arthritis.  IMPRESSION: Normal exam.   Original Report Authenticated By: Francene Boyers, M.D.     ROS Blood pressure 104/53, pulse 84, temperature 98.6 F (37 C), temperature source Oral, resp. rate 22, height 5\' 3"  (1.6 m), weight 74.401 kg (164 lb 0.4 oz), SpO2 97.00%. Physical Exam   Mental Status Examination/Evaluation:   Appearance: on bed   Eye Contact:: Good   Speech: normal   Volume: Normal   Mood: ok   Affect: ristricted   Thought Process:  organized   Orientation: Full   Thought Content: NO AVH   Suicidal Thoughts: No   Homicidal Thoughts: no   Memory: Recent; Poor   Judgement:  fair   Insight: fair   Psychomotor Activity: Normal   Concentration: Fair   Recall: Fair   Akathisia: No   Assessment:   AXIS I: adjustment d/o with mixed emotions   AXIS II: Deferred   AXIS III: see emdical hx ? ?        ? ?        ?        ? ?        ?        ? ?        ?        ? ?        ?        ?   AXIS IV: conflict with husband   AXIS V: 50   ?   Treatment Plan/Recommendations:   1.  Pt denies any SI  thought now or before admission. Pt is at increased risk of SI. Family was informed. Currently she denies any thoughts. Family and pt feels safe going home now. I will recommend out pt psy follow after discharge. Therapy can be helpful too   2. Will follow as needed    Wonda Cerise 12/11/2012, 9:06 PM

## 2012-12-11 NOTE — Progress Notes (Signed)
Son Crystal [name], brought 4 rings to nurses station stating he had removed them from the pt, and that he wanted them placed with her pocketbook, and that he needed to get some change from her pocket book. Informed son that we did not have her belongings (clothing only placed at RN station). Verified with pt that rings were hers, and that she had removed 4 rings and given them to son. Security paged to take belongings. Will continue to monitor.

## 2012-12-11 NOTE — Progress Notes (Signed)
Nutrition Brief Note  Patient identified on the Malnutrition Screening Tool (MST) Report  Body mass index is 29.06 kg/(m^2). Patient meets criteria for Overweight based on current BMI. Pt denies wt loss and states that her usual body weight is 162 lbs; pt was weighed at 164 lbs on 3/14.   Current diet order is Dysphagia 3, patient is consuming approximately 75% of meals at this time. Pt states she was eating well PTA and has a good appetite today. Pt does not follow any special diet at home and has no questions or concerns at this time. Labs and medications reviewed.   No nutrition interventions warranted at this time. If nutrition issues arise, please consult RD.   Ian Malkin RD, LDN Inpatient Clinical Dietitian Pager: [phone] After Hours Pager: [phone]

## 2012-12-11 NOTE — Progress Notes (Signed)
Rings sent to safe, pt signed belongings form.

## 2012-12-12 MED ORDER — BUPROPION HCL ER (XL) 150 MG PO TB24: 150.0000 mg | ORAL_TABLET | Freq: Every day | ORAL | Status: DC

## 2012-12-12 MED ORDER — DULOXETINE HCL 60 MG PO CPEP: 60.0000 mg | ORAL_CAPSULE | Freq: Every day | ORAL | Status: DC

## 2012-12-12 MED ORDER — ALPRAZOLAM 1 MG PO TABS: 0.5000 mg | ORAL_TABLET | Freq: Every evening | ORAL | Status: DC | PRN

## 2012-12-12 NOTE — Progress Notes (Signed)
[facility] PROGRESS NOTE  Crystal [name] ZOX:[id number] DOB: [birth year] DOA: 12/09/2012 PCP: Ailene Ravel, MD   Patient is medically stable to transfer to behavior health   Assessment/Plan: #1 acute encephalopathy: Most likely secondary to drug overdose. She was  monitored closely in the step down unit initially . She improved by HOD no 2 and was moved to the floor. By HOD #3 the patient's encephalopathy has resolved completely . Unfortunately she displays some cognitive impairment and was unable to remember specific details of our conversation from 12/11/12 when I have questioned her on 12/12/12.    #2 possible intentional overdose: Suicide precautions in place. Continue to monitor closely for adverse effects of the medications. It is unclear what she may have taken. It is likely she has taken her narcotics and benzodiazepines considering the urine drug screen.  . Patient denies suicidal ideations. When asked if she has tried to kill herself before she denies it, interesting enough her children in the room passed me a note that said that she has tried to kill herself before.   On December 12, 2012 the patient changed her story and she claims she has been in a psychiatric hospital about 2 years ago in a place far away. She cannot recall any details surrounding that admission.  #3 hypothermia: Most likely due to drug affect. Warming blankets have been applied. She has been given broad-spectrum antibiotics by the ED for presumed sepsis. Blood cultures have been drawn and they are without growth. Resolved by 12/10/12 . TSH 0.517. No signs of infection   #4 history of coronary artery disease: s/p CABG in 2002.  She appears to be stable from cardiac standpoint. EKG does not show any acute ischemic changes.   #5 history of chronic back pain: Monitor closely for withdrawal symptoms when she is awake. Resumed nabumetone 12/10/12   #6 history of depression and anxiety: As mentioned above, she will need  to be seen by psychiatry when she is medically stable.  Resumed wellbutrin and cymbalta 12/10/12 at home dose  Decreased dose of wellbutrin and cymbalta to 150 respectively 60 on 12/11/13 due to persistent tachycardia and [place] QTc . Tachycardia resolved and EKG on 12/11/12 was found not to have any more prolonged QTc.         Code Status: full  Family Communication: children at bedside on March 15 Disposition Plan: Transfer to psychiatric hospital   Consultants:  Psychiatry  HPI/Subjective: Wide awake - she recognizes me. Complains of pain in her shoulders.  Objective: Filed Vitals:   12/11/12 0939 12/11/12 1433 12/11/12 2042 12/12/12 0529  BP: 95/64 81/50 104/53 116/56  Pulse: 91 89 84 77  Temp: 98.1 F (36.7 C) 98.3 F (36.8 C) 98.6 F (37 C) 98.3 F (36.8 C)  TempSrc: Oral Oral Oral Oral  Resp:   22 24  Height:   5\' 3"  (1.6 m)   Weight:   74.401 kg (164 lb 0.4 oz)   SpO2: 96% 98% 97% 97%   Patient Vitals for the past 24 hrs:  BP Temp Temp src Pulse Resp SpO2 Height Weight  12/12/12 0529 116/56 mmHg 98.3 F (36.8 C) Oral 77 24 97 % - -  12/11/12 2042 104/53 mmHg 98.6 F (37 C) Oral 84 22 97 % 5\' 3"  (1.6 m) 74.401 kg (164 lb 0.4 oz)  12/11/12 1433 81/50 mmHg 98.3 F (36.8 C) Oral 89 - 98 % - -  12/11/12 0939 95/64 mmHg 98.1 F (36.7 C) Oral 91 -  96 % - -     Intake/Output Summary (Last 24 hours) at 12/12/12 0820 Last data filed at 12/12/12 0530  Gross per 24 hour  Intake    963 ml  Output   1900 ml  Net   -937 ml   Filed Weights   12/09/12 2046 12/10/12 2133 12/11/12 2042  Weight: 74.3 kg (163 lb 12.8 oz) 74.4 kg (164 lb 0.4 oz) 74.401 kg (164 lb 0.4 oz)    Exam:   General:  Alert , oriented to month, place  Cardiovascular: tachy, reg, no m  Respiratory: ctab   Abdomen: soft, nt   Musculoskeletal: intact,   Data Reviewed: Basic Metabolic Panel:  Recent Labs Lab 12/09/12 1556 12/10/12 0435 12/11/12 0924  NA 140 143 142  K 3.6 3.5 3.3*   CL 105 110 109  CO2 23 22 22   GLUCOSE 165* 101* 125*  BUN 17 16 12   CREATININE 1.03 1.01 1.19*  CALCIUM 8.7 8.3* 8.5   Liver Function Tests:  Recent Labs Lab 12/09/12 1556 12/10/12 0435  AST 18 21  ALT 9 8  ALKPHOS 72 60  BILITOT 0.6 0.6  PROT 6.0 5.4*  ALBUMIN 3.3* 2.8*   No results found for this basename: LIPASE, AMYLASE,  in the last 168 hours No results found for this basename: AMMONIA,  in the last 168 hours CBC:  Recent Labs Lab 12/09/12 1556 12/10/12 0435 12/11/12 0924  WBC 6.0 8.8 6.1  HGB 13.1 12.4 11.3*  HCT 35.2* 34.1* 30.8*  MCV 92.6 93.4 93.9  PLT 194 188 185   Cardiac Enzymes: No results found for this basename: CKTOTAL, CKMB, CKMBINDEX, TROPONINI,  in the last 168 hours BNP (last 3 results) No results found for this basename: PROBNP,  in the last 8760 hours CBG:  Recent Labs Lab 12/09/12 2241  GLUCAP 102*    Recent Results (from the past 240 hour(s))  CULTURE, BLOOD (ROUTINE X 2)     Status: None   Collection Time    12/09/12  3:50 PM      Result Value Range Status   Specimen Description BLOOD RIGHT ANTECUBITAL   Final   Special Requests BOTTLES DRAWN AEROBIC ONLY 10CC   Final   Culture  Setup Time 12/09/2012 22:44   Final   Culture     Final   Value:        BLOOD CULTURE RECEIVED NO GROWTH TO DATE CULTURE WILL BE HELD FOR 5 DAYS BEFORE ISSUING A FINAL NEGATIVE REPORT   Report Status PENDING   Incomplete  CULTURE, BLOOD (ROUTINE X 2)     Status: None   Collection Time    12/09/12  4:00 PM      Result Value Range Status   Specimen Description BLOOD HAND RIGHT   Final   Special Requests BOTTLES DRAWN AEROBIC ONLY 10CC   Final   Culture  Setup Time 12/09/2012 22:44   Final   Culture     Final   Value:        BLOOD CULTURE RECEIVED NO GROWTH TO DATE CULTURE WILL BE HELD FOR 5 DAYS BEFORE ISSUING A FINAL NEGATIVE REPORT   Report Status PENDING   Incomplete     Studies: Dg Shoulder Left Port  12/10/2012  *RADIOLOGY REPORT*  Clinical  Data: Left shoulder pain and limited range of motion since a fall yesterday.  PORTABLE LEFT SHOULDER - 2+ VIEW  Comparison: None.  Findings: There is no fracture, dislocation, or arthritis.  IMPRESSION:  Normal exam.   Original Report Authenticated By: Francene Boyers, M.D.     Scheduled Meds: . atorvastatin  80 mg Oral Daily  . buPROPion  150 mg Oral Daily  . DULoxetine  60 mg Oral Daily  . enoxaparin (LOVENOX) injection  40 mg Subcutaneous Q24H  . isosorbide mononitrate  60 mg Oral Daily  . nabumetone  1,000 mg Oral BID  . pantoprazole  40 mg Oral Daily  . sodium chloride  3 mL Intravenous Q12H  . thiamine  100 mg Oral Daily   Continuous Infusions:    Principal Problem:   Acute encephalopathy Active Problems:   Hypothermia   CAD (coronary artery disease)   HTN (hypertension), benign   PVD (peripheral vascular disease)   Depression   Chronic back pain     Crystal [name]  [facility] Pager [phone]. If 7PM-7AM, please contact night-coverage at [email or web address], password [facility] 12/12/2012, 8:20 AM  LOS: 3 days

## 2012-12-12 NOTE — Progress Notes (Signed)
Pt son and daughter in law are at bedside with questions regarding care, requesting to speak with physician directly. Pt son states he will not let the psych MD that came earlier in the week speak to his mother, son would like to take her home. MD notified. Will continue to monitor.

## 2012-12-12 NOTE — Progress Notes (Signed)
Patient discharged, has received medications from pharmacy and belongings from security. Escorted by NT in wheelchair.

## 2012-12-12 NOTE — Progress Notes (Signed)
Foley d/c'd at 10:30am.

## 2012-12-12 NOTE — Progress Notes (Signed)
Pt daughter in law Lynnell Dike is requesting to have a CM/SW come and evaluate the patient for a home health RN and medication assistance. MD aware, Will continue to monitor.

## 2012-12-12 NOTE — Discharge Summary (Signed)
Physician Discharge Summary  Crystal [name] ZOX:[id number] DOB: Aug 20, 1951 DOA: 12/09/2012  PCP: Crystal [name]  Admit date: 12/09/2012 Discharge date: 12/12/2012  Time spent: 80 minutes  Recommendations for Outpatient Follow-up:  1. Patient needs close followup for her depression  Discharge Diagnoses:   Acute encephalopathy - most likely secondary to polypharmacy-resolved   Hypothermia - unclear etiology-resolved   CAD (coronary artery disease)   HTN (hypertension), benign   PVD (peripheral vascular disease)   Depression   Chronic back pain   Discharge Condition: Good, alert and oriented x3, denies suicidal ideations, agrees to call emergency services in case she has severe depression thoughts  Diet recommendation: Heart healthy diet  Filed Weights   12/09/12 2046 12/10/12 2133 12/11/12 2042  Weight: 74.3 kg (163 lb 12.8 oz) 74.4 kg (164 lb 0.4 oz) 74.401 kg (164 lb 0.4 oz)    History of present illness:  Crystal [name] is a 62 y.o. female with a past medical history of coronary artery disease with stent placements in the past, depression, anxiety, chronic back pain, hypertension, peripheral vascular disease, who was found on the floor of her house. She was found by her family members. EMS was called and the patient was brought into the hospital. Initially, pinpoint pupils were noted. She was given Narcan without any appreciable change. Patient's family is unaware of any head injuries. No recent illness. I did speak with the patient's son at [phone]. He mentioned that over the past 1 month patient has been very depressed. She has made statements stating that she wanted to end her life. So, he thinks that patient may have taken more medication than she should have. There has been no history of any fever. Currently, patient is somnolent. She grimaces with the painful stimuli. However, does not respond otherwise. No history is available from her at this time.   Hospital Course:  #1  acute encephalopathy: Most likely secondary to drug overdose. She was monitored closely in the step down unit initially . She improved by HOD no 2 and was moved to the floor. By HOD #3 the patient's encephalopathy has resolved completely . Unfortunately she displays some cognitive impairment and was unable to remember specific details of our conversation from 12/11/12 when I have questioned her on 12/12/12. In the [place] run he may be worthwhile measuring Mini-Mental Status exams scores.  #2 possible intentional overdose: Patient had Suicide precautions in place throughout her admission. . It it remained in unclear what she may have taken prior to admission. It is likely she has taken her narcotics and benzodiazepines considering the urine drug screen. . Patient denied suicidal ideations. The patient was seen by Dr. Fredda [name] from psychiatry. Patient was recommended to pursue inpatient psychiatric evaluation. The patient politely declined. The psychiatrist did not feel that the patient must be involuntary committed. Patient has promised to call emergency services in case she feels depressed and wants to give herself #3 hypothermia: Most likely due to drug affect. Warming blankets have been applied. She has been given broad-spectrum antibiotics by the ED for presumed sepsis. Blood cultures have been drawn and they are without growth. Resolved by 12/10/12 . TSH 0.517. No signs of infection  #4 history of coronary artery disease: s/p CABG in 2002. She appears to be stable from cardiac standpoint. EKG does not show any acute ischemic changes.  #5 history of chronic back pain: Monitor closely for withdrawal symptoms when she is awake. Resumed nabumetone 12/10/12  #6 history of depression and anxiety:  As mentioned above, she will need to be seen by psychiatry when she is medically stable.  Resumed wellbutrin and cymbalta 12/10/12 at home dose  Decreased dose of wellbutrin and cymbalta to 150 respectively 60 on 12/11/13 due to  persistent tachycardia and Lozoya QTc .  Tachycardia resolved and EKG on 12/11/12 was found not to have any more prolonged QTc.      Procedures:  None  Consultations:  Psychiatry  Discharge Exam: Filed Vitals:   12/12/12 0529 12/12/12 0850 12/12/12 1023 12/12/12 1406  BP: 116/56 123/55 121/59 155/67  Pulse: 77 101 84 89  Temp: 98.3 F (36.8 C) 99.4 F (37.4 C) 98.2 F (36.8 C) 98.6 F (37 C)  TempSrc: Oral Axillary Oral Oral  Resp: 24 18 18 18   Height:      Weight:      SpO2: 97% 95% 97% 94%    General: Alert and oriented x3 Cardiovascular: Regular rate and rhythm without murmurs rubs or gallops Respiratory: Clear to auscultation bilaterally  Discharge Instructions  Discharge Orders   Future Orders Complete By Expires     Diet - low sodium heart healthy  As directed     Increase activity slowly  As directed         Medication List    STOP taking these medications       meclizine 25 MG tablet  Commonly known as:  ANTIVERT      TAKE these medications       albuterol 108 (90 BASE) MCG/ACT inhaler  Commonly known as:  PROVENTIL HFA;VENTOLIN HFA  Inhale 2 puffs into the lungs every 6 (six) hours as needed for wheezing.     ALPRAZolam 1 MG tablet  Commonly known as:  XANAX  Take 0.5 tablets (0.5 mg total) by mouth at bedtime as needed for sleep. For acute anxiety     atorvastatin 80 MG tablet  Commonly known as:  LIPITOR  Take 80 mg by mouth daily.     buPROPion 150 MG 24 hr tablet  Commonly known as:  WELLBUTRIN XL  Take 1 tablet (150 mg total) by mouth daily.     cilostazol 50 MG tablet  Commonly known as:  PLETAL  Take 50 mg by mouth 2 (two) times daily.     cyclobenzaprine 10 MG tablet  Commonly known as:  FLEXERIL  Take 10 mg by mouth 2 (two) times daily as needed for muscle spasms. For neck and muscle pain     DULoxetine 60 MG capsule  Commonly known as:  CYMBALTA  Take 1 capsule (60 mg total) by mouth daily.     esomeprazole 40 MG  capsule  Commonly known as:  NEXIUM  Take 40 mg by mouth daily before breakfast.     ezetimibe 10 MG tablet  Commonly known as:  ZETIA  Take 10 mg by mouth daily.     HYDROcodone-acetaminophen 5-325 MG per tablet  Commonly known as:  NORCO/VICODIN  Take 1 tablet by mouth 2 (two) times daily as needed (for back pain).     isosorbide mononitrate 60 MG 24 hr tablet  Commonly known as:  IMDUR  Take 60 mg by mouth daily.     nabumetone 500 MG tablet  Commonly known as:  RELAFEN  Take 1,000 mg by mouth 2 (two) times daily.     nitroGLYCERIN 0.4 MG SL tablet  Commonly known as:  NITROSTAT  Place 0.4 mg under the tongue every 5 (five) minutes as needed for chest  pain. x3 doses as needed for chest pain           Follow-up Information   Schedule an appointment as soon as possible for a visit with CONROY,NATHAN, PA-C.   Contact information:   [facility] [address] [place] [address] [phone]        The results of significant diagnostics from this hospitalization (including imaging, microbiology, ancillary and laboratory) are listed below for reference.    Significant Diagnostic Studies: Dg Chest 1 View  12/09/2012  *RADIOLOGY REPORT*  Clinical Data: Altered mental status.  Unresponsive.  CHEST - 1 VIEW  Comparison: 10/15/2010  Findings: Prior median sternotomy.  Heart is borderline in size. Low lung volumes.  Mild peribronchial thickening.  No confluent opacities or effusions.  No acute bony abnormality.  IMPRESSION: Low lung volumes.  Mild bronchitic changes.   Original Report Authenticated By: Charlett Nose, M.D.    Ct Head Wo Contrast  12/09/2012  *RADIOLOGY REPORT*  Clinical Data: Possible overdose.  Altered mental status. Decreased responsiveness.  CT HEAD WITHOUT CONTRAST  Technique:  Contiguous axial images were obtained from the base of the skull through the vertex without contrast.  Comparison: None.  Findings: No evidence of acute infarct, acute  hemorrhage, mass lesion, mass effect or hydrocephalus.  Visualized portions of the paranasal sinuses and mastoid air cells are clear.  IMPRESSION: Negative.   Original Report Authenticated By: Leanna Battles, M.D.    Dg Shoulder Left Port  12/10/2012  *RADIOLOGY REPORT*  Clinical Data: Left shoulder pain and limited range of motion since a fall yesterday.  PORTABLE LEFT SHOULDER - 2+ VIEW  Comparison: None.  Findings: There is no fracture, dislocation, or arthritis.  IMPRESSION: Normal exam.   Original Report Authenticated By: Francene Boyers, M.D.     Microbiology: Recent Results (from the past 240 hour(s))  CULTURE, BLOOD (ROUTINE X 2)     Status: None   Collection Time    12/09/12  3:50 PM      Result Value Range Status   Specimen Description BLOOD RIGHT ANTECUBITAL   Final   Special Requests BOTTLES DRAWN AEROBIC ONLY 10CC   Final   Culture  Setup Time 12/09/2012 22:44   Final   Culture     Final   Value:        BLOOD CULTURE RECEIVED NO GROWTH TO DATE CULTURE WILL BE HELD FOR 5 DAYS BEFORE ISSUING A FINAL NEGATIVE REPORT   Report Status PENDING   Incomplete  CULTURE, BLOOD (ROUTINE X 2)     Status: None   Collection Time    12/09/12  4:00 PM      Result Value Range Status   Specimen Description BLOOD HAND RIGHT   Final   Special Requests BOTTLES DRAWN AEROBIC ONLY 10CC   Final   Culture  Setup Time 12/09/2012 22:44   Final   Culture     Final   Value:        BLOOD CULTURE RECEIVED NO GROWTH TO DATE CULTURE WILL BE HELD FOR 5 DAYS BEFORE ISSUING A FINAL NEGATIVE REPORT   Report Status PENDING   Incomplete     Labs: Basic Metabolic Panel:  Recent Labs Lab 12/09/12 1556 12/10/12 0435 12/11/12 0924  NA 140 143 142  K 3.6 3.5 3.3*  CL 105 110 109  CO2 23 22 22   GLUCOSE 165* 101* 125*  BUN 17 16 12   CREATININE 1.03 1.01 1.19*  CALCIUM 8.7 8.3* 8.5   Liver  Function Tests:  Recent Labs Lab 12/09/12 1556 12/10/12 0435  AST 18 21  ALT 9 8  ALKPHOS 72 60  BILITOT 0.6  0.6  PROT 6.0 5.4*  ALBUMIN 3.3* 2.8*   No results found for this basename: LIPASE, AMYLASE,  in the last 168 hours No results found for this basename: AMMONIA,  in the last 168 hours CBC:  Recent Labs Lab 12/09/12 1556 12/10/12 0435 12/11/12 0924  WBC 6.0 8.8 6.1  HGB 13.1 12.4 11.3*  HCT 35.2* 34.1* 30.8*  MCV 92.6 93.4 93.9  PLT 194 188 185   Cardiac Enzymes: No results found for this basename: CKTOTAL, CKMB, CKMBINDEX, TROPONINI,  in the last 168 hours BNP: BNP (last 3 results) No results found for this basename: PROBNP,  in the last 8760 hours CBG:  Recent Labs Lab 12/09/12 2241  GLUCAP 102*       Signed:  Sloane Palmer  [facility] 12/12/2012, 3:50 PM

## 2012-12-12 NOTE — Progress Notes (Signed)
Spoke with Pt son Bethann Berkshire, son states he is on his way and will be here in the next 20-30 minutes. Pt has removed tele, MD aware, states ok to remove IVs and d/c tele at this time. Will continue to monitor.

## 2012-12-12 NOTE — Progress Notes (Signed)
PT Cancellation Note  Patient Details Name: Crystal [name] MRN: [id number] DOB: [birth year]   Cancelled Treatment:     Note psych c/s pending, and note events of this morning, will defer PT eval until situation resolved.  Will follow up next date.   Narda Amber [facility] 12/12/2012, 11:08 AM

## 2012-12-12 NOTE — Progress Notes (Signed)
Patient attempting to leave ama, states "I did not try to kill myself, if I did I would be dead," and "I saw them papers that said suicide, I dont want that on my record." MD notified, states that pt cannot leave until assessed by psych, if continues to attempt to leave will need to sign IVC papers, wants to attempt to have family come and calm pt down first. Pt became agitated when she found out Wellbutrin and cymbalta dosages had been lowered. Will continue to monitor.

## 2012-12-12 NOTE — Progress Notes (Signed)
Referral - Home Health  NCM spoke to patient and she had HH in the past but no sure of agency name. Provided [facility] list. She states she could look up information when she gets home. NCM will contact pt at home for [facility] agency name. Isidoro Donning RN CCM Case Mgmt phone [phone]

## 2012-12-13 NOTE — Progress Notes (Signed)
   CARE MANAGEMENT NOTE 12/13/2012  Patient:  Crystal [name], Crystal [name]   Account Number:  [id number]  Date Initiated:  12/10/2012  Documentation initiated by:  Donn Pierini  Subjective/Objective Assessment:   Pt found down at home- AMS     Action/Plan:   PTA pt lived at home - independent- NCM to follow along for pt progression and d/c needs   Anticipated DC Date:  12/13/2012   Anticipated DC Plan:  HOME W HOME HEALTH SERVICES      DC Planning Services  CM consult      [facility] Choice  HOME HEALTH   Choice offered to / List presented to:  C-1 Patient        HH arranged  HH-1 RN  HH-2 PT  HH-3 OT  HH-4 NURSE'S AIDE      HH agency  [facility]   Status of service:  Completed, signed off Medicare Important Message given?   (If response is "NO", the following Medicare IM given date fields will be blank) Date Medicare IM given:   Date Additional Medicare IM given:    Discharge Disposition:  HOME W HOME HEALTH SERVICES  Per UR Regulation:  Reviewed for med. necessity/level of care/duration of stay  If discussed at [place] Length of Stay Meetings, dates discussed:    Comments:  12/13/2012 1300 NCM spoke to pt and she believes she had [facility]. Contacted [facility] for [facility] and she was a client in the past. Referral given to [facility] rep for [facility]. Isidoro Donning RN CCM Case Mgmt phone [phone]  12/12/2012 5:45 PM Referral - Home Health NCM spoke to patient and she had HH in the past but no sure of agency name. Provided [facility] list. She states she could look up information when she gets home. NCM will contact pt at home for [facility] agency name. Isidoro Donning RN CCM Case Mgmt phone [phone]

## 2012-12-15 LAB — CULTURE, BLOOD (ROUTINE X 2)
Culture: NO GROWTH
Culture: NO GROWTH

## 2013-09-26 ENCOUNTER — Other Ambulatory Visit: Payer: Self-pay | Admitting: Cardiology

## 2013-10-25 ENCOUNTER — Encounter: Payer: Self-pay | Admitting: Cardiology

## 2013-10-25 ENCOUNTER — Ambulatory Visit (INDEPENDENT_AMBULATORY_CARE_PROVIDER_SITE_OTHER): Payer: Medicaid Other | Admitting: Cardiology

## 2013-10-25 VITALS — BP 120/68 | HR 116 | Ht 61.0 in | Wt 161.0 lb

## 2013-10-25 DIAGNOSIS — I2089 Other forms of angina pectoris: Secondary | ICD-10-CM

## 2013-10-25 DIAGNOSIS — I2510 Atherosclerotic heart disease of native coronary artery without angina pectoris: Secondary | ICD-10-CM

## 2013-10-25 DIAGNOSIS — Z87891 Personal history of nicotine dependence: Secondary | ICD-10-CM

## 2013-10-25 DIAGNOSIS — I471 Supraventricular tachycardia, unspecified: Secondary | ICD-10-CM

## 2013-10-25 DIAGNOSIS — I739 Peripheral vascular disease, unspecified: Secondary | ICD-10-CM

## 2013-10-25 DIAGNOSIS — I208 Other forms of angina pectoris: Secondary | ICD-10-CM

## 2013-10-25 DIAGNOSIS — Z951 Presence of aortocoronary bypass graft: Secondary | ICD-10-CM

## 2013-10-25 MED ORDER — TOPROL XL 50 MG PO TB24: 100.0000 mg | ORAL_TABLET | Freq: Every day | ORAL | Status: DC

## 2013-10-25 NOTE — Patient Instructions (Addendum)
Your physician has recommended you make the following change in your medication:   1. Increase Metoprolol too 100 mg once daily  Your physician has requested that you have a lower extremity arterial duplex. This test is an ultrasound of the arteries in the legs or arms. It looks at arterial blood flow in the legs and arms. Allow one hour for Lower and Upper Arterial scans. There are no restrictions or special instructions  Your physician recommends that you schedule a follow-up appointment in: 1 week with Dr. Anne FuSkains

## 2013-10-25 NOTE — Progress Notes (Signed)
[address] [place], [place]  [address] Phone: [phone] Fax:  [phone]  Date:  10/25/2013   ID:  Crystal [name], DOB 05-10-1951, MRN [id number]  PCP:  Arlyss Queen   History of Present Illness: Crystal [name] is a 63 y.o. female with coronary artery disease,bypass, 3 prior ostial RCA stents according to records patent by nonselective coronary angiography on 06/19/08. Prior bypass, RIMA to RCA was occluded on catheter 2/05. Small ASD, peripheral vascular disease with patch angioplasty to right femoral artery here for follow up.   Yesterday, vomit, sweating, felt poor. Feeling better. No aches. Felt SOB.   Mild chest pain at times. Pressure. Off and on past few weeks. Can happen at rest and movement. Trouble catching breath. Chronic for her.  Has seven kids. 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   Occasional pain at night when walking leg pain that is.   Discussed hyperlipidemia. Goal LDL is less than 70. Multiple drug regimen as above.     Wt Readings from Last 3 Encounters:  10/25/13 161 lb (73.029 kg)  12/11/12 164 lb 0.4 oz (74.401 kg)     Past Medical History  Diagnosis Date  . Osteoarthritis   . Osteopenia   . Pseudodementia   . Diabetes mellitus without complication   . Chronic ischemic heart disease   . GERD (gastroesophageal reflux disease)   . Depression   . Anxiety   . Back pain, chronic   . Hypertension   . Hypercholesteremia   . Coronary atherosclerosis of native coronary artery   . Other and unspecified angina pectoris   . Claudication   . Postsurgical aortocoronary bypass status   . Postsurgical percutaneous transluminal coronary angioplasty status   . PAD (peripheral artery disease)   . ASCVD (arteriosclerotic cardiovascular disease)     single vessel  . Occlusion and stenosis of carotid artery without mention of cerebral infarction     Last carotid doppler was in 2006. Patch angioplasty right femoral artery  01/1998, patenet 09/2005 ([facility])  . MRSA carrier     Chronic  . Right carotid bruit     Negative doppler 09/2002  . ASD (atrial septal defect)     Small ASD with insignificant shunt  . SOB (shortness of breath)     ECHO 04/13/12 EF estimated at 55-60%  . Edema   . Angina pectoris     Past Surgical History  Procedure Laterality Date  . Tubal ligation    . Coronary angioplasty with stent placement  01/15/98    PTCA stent RCA, PTCA diagonal  . Coronary angioplasty with stent placement  08/17/00    Cutting balloon ostial RCA  . Coronary angioplasty with stent placement  01/19/01    Unsuccessful attempted PTCA ostial RCA, stent protruding into root  . Coronary angioplasty with stent placement  01/20/01    RIMA-RCA, occluded cath 10/2003  . Coronary angioplasty with stent placement  02/02/04    Successful PTCA/drug eluding stent ostial RCA, [facility]    Current Outpatient Prescriptions  Medication Sig Dispense Refill  . albuterol (PROVENTIL HFA;VENTOLIN HFA) 108 (90 BASE) MCG/ACT inhaler Inhale 2 puffs into the lungs every 6 (six) hours as needed for wheezing.       Marland Kitchen ALPRAZolam (XANAX) 1 MG tablet Take 0.5 tablets (0.5 mg total) by mouth at bedtime as needed for sleep. For acute anxiety  30 tablet    . atorvastatin (LIPITOR) 80 MG tablet Take 80  mg by mouth daily.      Marland Kitchen. buPROPion (WELLBUTRIN XL) 150 MG 24 hr tablet Take 1 tablet (150 mg total) by mouth daily.  30 tablet  0  . cilostazol (PLETAL) 50 MG tablet Take 50 mg by mouth 2 (two) times daily.      . cyclobenzaprine (FLEXERIL) 10 MG tablet Take 10 mg by mouth 2 (two) times daily as needed for muscle spasms. For neck and muscle pain      . DULoxetine (CYMBALTA) 60 MG capsule Take 1 capsule (60 mg total) by mouth daily.      Marland Kitchen. esomeprazole (NEXIUM) 40 MG capsule Take 40 mg by mouth daily before breakfast.      . ezetimibe (ZETIA) 10 MG tablet Take 10 mg by mouth daily.      . isosorbide mononitrate (IMDUR) 60 MG 24 hr tablet TAKE ONE  TABLET BY MOUTH ONCE DAILY  102 tablet  0  . metFORMIN (GLUCOPHAGE) 500 MG tablet Take 500 mg by mouth daily with breakfast.      . nabumetone (RELAFEN) 500 MG tablet Take 1,000 mg by mouth 2 (two) times daily.      . nitroGLYCERIN (NITROSTAT) 0.4 MG SL tablet Place 0.4 mg under the tongue every 5 (five) minutes as needed for chest pain. x3 doses as needed for chest pain      . TOPROL XL 50 MG 24 hr tablet TAKE ONE TABLET BY MOUTH ONCE DAILY  102 tablet  0   No current facility-administered medications for this visit.    Allergies:    Allergies  Allergen Reactions  . Bactrim [Sulfamethoxazole-Tmp Ds]   . Penicillins     Unknown   . Sulfa Antibiotics     unknown    Social History:  The patient  reports that she quit smoking about 3 years ago. Her smoking use included Cigarettes. She smoked 0.00 packs per day. She has never used smokeless tobacco. She reports that she does not drink alcohol or use illicit drugs.   ROS:  Please see the history of present illness.    Denies any fevers, chills, orthopnea, PND  PHYSICAL EXAM: VS:  BP 120/68  Pulse 116  Ht 5\' 1"  (1.549 m)  Wt 161 lb (73.029 kg)  BMI 30.44 kg/m2 Well nourished, well developed, in no acute distress HEENT: normal Neck: no JVD Cardiac:  Tachy RR; no murmur Lungs:  clear to auscultation bilaterally, no wheezing, rhonchi or rales Abd: soft, nontender, no hepatomegaly Ext: no edemaDecreased DP/PT Skin: warm and dry, chapped.  Neuro: no focal abnormalities noted  EKG:  Sinus tachycardia rate 116, possible atrial tachycardia, old inferior infarct pattern.  ASSESSMENT AND PLAN:  1. Coronary artery disease-status post bypass, prior RCA stents.  2. Tachycardia-likely sinus tachycardia but possible atrial tachycardia at 116 beats per minute. I would like to increase her Toprol from 50-100 mg. I will see her back in APP clinic in one week to hopefully demonstrate resolution of tachycardia. Consider echocardiogram at that time  if tachycardia not resolved. 3. Angina, currently stable. Isosorbide, Toprol. Atypical chest pain off and on. Multiple somatic complaints. 4. ASD - small atrial septal defect corrected with patch during bypass. 5. Former smoker-good job with stopping tobacco. 6. PVD-currently on cilostazol/Pletal, ASA 81. Leg pain, I will check lower extremity arterial Dopplers .  Signed, Donato SchultzMark Layla Gramm, MD [facility] Center[facility]  10/25/2013 11:14 AM

## 2013-11-04 ENCOUNTER — Ambulatory Visit (INDEPENDENT_AMBULATORY_CARE_PROVIDER_SITE_OTHER): Payer: Medicaid Other | Admitting: Cardiology

## 2013-11-04 ENCOUNTER — Ambulatory Visit (HOSPITAL_COMMUNITY): Payer: Medicaid Other | Attending: Internal Medicine

## 2013-11-04 ENCOUNTER — Encounter: Payer: Self-pay | Admitting: Cardiology

## 2013-11-04 VITALS — BP 122/60 | HR 55 | Ht 61.0 in | Wt 162.0 lb

## 2013-11-04 DIAGNOSIS — Z87891 Personal history of nicotine dependence: Secondary | ICD-10-CM | POA: Insufficient documentation

## 2013-11-04 DIAGNOSIS — I10 Essential (primary) hypertension: Secondary | ICD-10-CM | POA: Insufficient documentation

## 2013-11-04 DIAGNOSIS — I471 Supraventricular tachycardia: Secondary | ICD-10-CM

## 2013-11-04 DIAGNOSIS — I739 Peripheral vascular disease, unspecified: Secondary | ICD-10-CM

## 2013-11-04 DIAGNOSIS — I2510 Atherosclerotic heart disease of native coronary artery without angina pectoris: Secondary | ICD-10-CM | POA: Insufficient documentation

## 2013-11-04 DIAGNOSIS — E785 Hyperlipidemia, unspecified: Secondary | ICD-10-CM | POA: Insufficient documentation

## 2013-11-04 DIAGNOSIS — I70219 Atherosclerosis of native arteries of extremities with intermittent claudication, unspecified extremity: Secondary | ICD-10-CM

## 2013-11-04 DIAGNOSIS — I208 Other forms of angina pectoris: Secondary | ICD-10-CM

## 2013-11-04 NOTE — Progress Notes (Signed)
[address] [place], [place]  [address] Phone: [phone](336) [phone] Fax:  [phone]([phone]  Date:  11/04/2013   ID:  Crystal [name], DOB 07/05/1951, MRN [id number]  PCP:  Arlyss QueenONROY,NATHAN, PA-C   History of Present Illness: Crystal [name] is a 63 y.o. female with coronary artery disease,bypass, 3 prior ostial RCA stents according to records patent by nonselective coronary angiography on 06/19/08. Prior bypass, RIMA to RCA was occluded on catheter 2/05. Small ASD, peripheral vascular disease with patch angioplasty to right femoral artery here for follow up.   Mild chest pain at times. Pressure. Off and on past few weeks. Can happen at rest and movement. Trouble catching breath. Chronic for her.   Has seven kids. Stress. 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   Occasional pain at night when walking leg pain that is.   Discussed hyperlipidemia. Goal LDL is less than 70. Multiple drug regimen as above.   11/04/13-since starting the Toprol, heart rate has decreased significantly. She is feeling better. Occasional atypical chest discomfort. Peripheral vascular lab preliminarily shows 3 vessel runoff bilaterally, mild stenosis of the right CFA, diffuse non-hemodynamically significant disease throughout the SFAs bilaterally.    Wt Readings from Last 3 Encounters:  11/04/13 162 lb (73.483 kg)  10/25/13 161 lb (73.029 kg)  12/11/12 164 lb 0.4 oz (74.401 kg)     Past Medical History  Diagnosis Date  . Osteoarthritis   . Osteopenia   . Pseudodementia   . Diabetes mellitus without complication   . Chronic ischemic heart disease   . GERD (gastroesophageal reflux disease)   . Depression   . Anxiety   . Back pain, chronic   . Hypertension   . Hypercholesteremia   . Coronary atherosclerosis of native coronary artery   . Other and unspecified angina pectoris   . Claudication   . Postsurgical aortocoronary bypass status   . Postsurgical percutaneous transluminal coronary angioplasty  status   . PAD (peripheral artery disease)   . ASCVD (arteriosclerotic cardiovascular disease)     single vessel  . Occlusion and stenosis of carotid artery without mention of cerebral infarction     Last carotid doppler was in 2006. Patch angioplasty right femoral artery 01/1998, patenet 09/2005 ([facility])  . MRSA carrier     Chronic  . Right carotid bruit     Negative doppler 09/2002  . ASD (atrial septal defect)     Small ASD with insignificant shunt  . SOB (shortness of breath)     ECHO 04/13/12 EF estimated at 55-60%  . Edema   . Angina pectoris     Past Surgical History  Procedure Laterality Date  . Tubal ligation    . Coronary angioplasty with stent placement  01/15/98    PTCA stent RCA, PTCA diagonal  . Coronary angioplasty with stent placement  08/17/00    Cutting balloon ostial RCA  . Coronary angioplasty with stent placement  01/19/01    Unsuccessful attempted PTCA ostial RCA, stent protruding into root  . Coronary angioplasty with stent placement  01/20/01    RIMA-RCA, occluded cath 10/2003  . Coronary angioplasty with stent placement  02/02/04    Successful PTCA/drug eluding stent ostial RCA, [facility]    Current Outpatient Prescriptions  Medication Sig Dispense Refill  . albuterol (PROVENTIL HFA;VENTOLIN HFA) 108 (90 BASE) MCG/ACT inhaler Inhale 2 puffs into the lungs every 6 (six) hours as needed for wheezing.       .Marland Kitchen  ALPRAZolam (XANAX) 1 MG tablet Take 0.5 tablets (0.5 mg total) by mouth at bedtime as needed for sleep. For acute anxiety  30 tablet    . aspirin 81 MG tablet Take 81 mg by mouth daily.      Marland Kitchen atorvastatin (LIPITOR) 80 MG tablet Take 80 mg by mouth daily.      Marland Kitchen buPROPion (WELLBUTRIN XL) 150 MG 24 hr tablet Take 1 tablet (150 mg total) by mouth daily.  30 tablet  0  . cilostazol (PLETAL) 50 MG tablet Take 50 mg by mouth 2 (two) times daily.      . cyclobenzaprine (FLEXERIL) 10 MG tablet Take 10 mg by mouth 2 (two) times daily as needed for muscle spasms.  For neck and muscle pain      . DULoxetine (CYMBALTA) 60 MG capsule Take 1 capsule (60 mg total) by mouth daily.      Marland Kitchen esomeprazole (NEXIUM) 40 MG capsule Take 40 mg by mouth daily before breakfast.      . ezetimibe (ZETIA) 10 MG tablet Take 10 mg by mouth daily.      . isosorbide mononitrate (IMDUR) 60 MG 24 hr tablet TAKE ONE TABLET BY MOUTH ONCE DAILY  102 tablet  0  . metFORMIN (GLUCOPHAGE) 500 MG tablet Take 500 mg by mouth daily with breakfast.      . nabumetone (RELAFEN) 500 MG tablet Take 1,000 mg by mouth 2 (two) times daily.      . nitroGLYCERIN (NITROSTAT) 0.4 MG SL tablet Place 0.4 mg under the tongue every 5 (five) minutes as needed for chest pain. x3 doses as needed for chest pain      . TOPROL XL 50 MG 24 hr tablet Take 2 tablets (100 mg total) by mouth daily. Take with or immediately following a meal.  60 tablet  5   No current facility-administered medications for this visit.    Allergies:    Allergies  Allergen Reactions  . Bactrim [Sulfamethoxazole-Tmp Ds]   . Penicillins     Unknown   . Sulfa Antibiotics     unknown    Social History:  The patient  reports that she quit smoking about 3 years ago. Her smoking use included Cigarettes. She smoked 0.00 packs per day. She has never used smokeless tobacco. She reports that she does not drink alcohol or use illicit drugs.   ROS:  Please see the history of present illness.    Denies any fevers, chills, orthopnea, PND  PHYSICAL EXAM: VS:  BP 122/48  Pulse 55  Ht 5\' 1"  (1.549 m)  Wt 162 lb (73.483 kg)  BMI 30.63 kg/m2 Well nourished, well developed, in no acute distress HEENT: normal Neck: no JVD Cardiac:  Tachy RR; no murmur Lungs:  clear to auscultation bilaterally, no wheezing, rhonchi or rales Abd: soft, nontender, no hepatomegaly Ext: no edemaDecreased DP/PT Skin: warm and dry, chapped.  Neuro: no focal abnormalities noted  EKG:  Sinus tachycardia rate 116, possible atrial tachycardia, old inferior infarct  pattern.  ASSESSMENT AND PLAN:  1. Coronary artery disease-status post bypass, prior RCA stents.  2. Tachycardia-previously sinus tachycardia but possible atrial tachycardia at 116 beats per minute. I increased her Toprol from 50-100 mg this seemed to help significantly. 3. Angina, currently stable. Isosorbide, Toprol. Atypical chest pain off and on. Multiple somatic complaints. 4. ASD - small atrial septal defect corrected with patch during bypass. 5. Former smoker-good job with stopping tobacco. 6. PVD-currently on cilostazol/Pletal, ASA 81. Mild stenosis  in the right common femoral artery, diffuse non-hemodynamically significant disease through superficial femoral arteries bilaterally. 3 vessel runoff bilaterally. Right ABI 0.86, left 0.92  Signed, Donato Schultz, MD [facility]  11/04/2013 1:48 PM

## 2013-11-04 NOTE — Patient Instructions (Signed)
Your physician recommends that you continue on your current medications as directed. Please refer to the Current Medication list given to you today.  Your physician wants you to follow-up in: 6 months with Dr. Skains. You will receive a reminder letter in the mail two months in advance. If you don't receive a letter, please call our office to schedule the follow-up appointment.  

## 2014-05-15 ENCOUNTER — Other Ambulatory Visit: Payer: Self-pay | Admitting: Cardiology

## 2014-05-17 ENCOUNTER — Telehealth: Payer: Self-pay | Admitting: *Deleted

## 2014-05-17 NOTE — Telephone Encounter (Signed)
Give her Imdur 60 PO QD.  Donato SchultzSKAINS, Crystal Pitner, MD

## 2014-05-17 NOTE — Telephone Encounter (Signed)
Pharmacy called and stated that the patients pcp sent in an rx for isosorbide dinatrate 20mg  bid. The pharmacy thinks that this may have been an error because when he called the pcp about it, the pcp said to let the cardiologist handle this. The pharmacist also stated that the patient has been taking the isosorbide mononitrate 60mg  bid for the last few months. Should this actually be qd as listed on her chart? Please advise. Thanks, MI

## 2014-05-18 ENCOUNTER — Other Ambulatory Visit: Payer: Self-pay | Admitting: *Deleted

## 2014-05-18 MED ORDER — ISOSORBIDE MONONITRATE ER 60 MG PO TB24: ORAL_TABLET | ORAL | Status: DC

## 2014-05-24 ENCOUNTER — Telehealth: Payer: Self-pay | Admitting: Cardiology

## 2014-05-24 NOTE — Telephone Encounter (Signed)
New message    Patient calling upcoming appt in oct.    Need to clarification on direction & dosage on all medication.   Can blood work be sent to [facility] in [place].

## 2014-05-24 NOTE — Telephone Encounter (Signed)
Reviewed medications with pt who states understanding - she is scheduled to be seen in October.

## 2014-06-07 ENCOUNTER — Telehealth: Payer: Self-pay

## 2014-06-07 ENCOUNTER — Other Ambulatory Visit: Payer: Self-pay

## 2014-06-07 MED ORDER — ISOSORBIDE MONONITRATE ER 60 MG PO TB24: ORAL_TABLET | ORAL | Status: DC

## 2014-06-07 NOTE — Telephone Encounter (Signed)
Patient states she has not taken Zetia in over a year, and asked to take medication off her med list. Patient has no other concerns.

## 2014-07-03 ENCOUNTER — Other Ambulatory Visit: Payer: Self-pay | Admitting: Cardiology

## 2014-07-24 ENCOUNTER — Encounter: Payer: Self-pay | Admitting: Cardiology

## 2014-07-24 ENCOUNTER — Ambulatory Visit (INDEPENDENT_AMBULATORY_CARE_PROVIDER_SITE_OTHER): Payer: Medicaid Other | Admitting: Cardiology

## 2014-07-24 VITALS — BP 134/82 | HR 66 | Ht 60.0 in | Wt 163.1 lb

## 2014-07-24 DIAGNOSIS — I10 Essential (primary) hypertension: Secondary | ICD-10-CM

## 2014-07-24 DIAGNOSIS — I739 Peripheral vascular disease, unspecified: Secondary | ICD-10-CM

## 2014-07-24 DIAGNOSIS — E669 Obesity, unspecified: Secondary | ICD-10-CM

## 2014-07-24 DIAGNOSIS — I2583 Coronary atherosclerosis due to lipid rich plaque: Principal | ICD-10-CM

## 2014-07-24 DIAGNOSIS — I2510 Atherosclerotic heart disease of native coronary artery without angina pectoris: Secondary | ICD-10-CM

## 2014-07-24 NOTE — Patient Instructions (Signed)
The current medical regimen is effective;  continue present plan and medications.  Follow up in 6 months with Dr. Skains.  You will receive a letter in the mail 2 months before you are due.  Please call us when you receive this letter to schedule your follow up appointment.  

## 2014-07-24 NOTE — Progress Notes (Addendum)
[address] [place], [place]  [address] Phone: [phone](336) [phone] Fax:  [phone]([phone]  Date:  07/24/2014   ID:  Crystal ColaGlenda P [name], DOB 01/20/1951, MRN [id number]  PCP:  Crystal QueenONROY,NATHAN, PA-C   History of Present Illness: Crystal GlasgowGlenda P [name] is a 63 y.o. female with coronary artery disease,bypass, 3 prior ostial RCA stents according to records patent by nonselective coronary angiography on 06/19/08. Prior bypass, RIMA to RCA was occluded on catheter 2/05. Small ASD, peripheral vascular disease with patch angioplasty to right femoral artery here for follow up.   Mild chest pain at times. Pressure. Can happen at rest and movement. No change.   Has seven kids. 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  Discussed hyperlipidemia. Goal LDL is less than 70. Multiple drug regimen as above.   Since starting the Toprol, heart rate has decreased significantly. She is feeling better. Occasional atypical chest discomfort. Peripheral vascular lab preliminarily shows 3 vessel runoff bilaterally, mild stenosis of the right CFA, diffuse non-hemodynamically significant disease throughout the SFAs bilaterally.    Wt Readings from Last 3 Encounters:  07/24/14 163 lb 1.9 oz (73.991 kg)  11/04/13 162 lb (73.483 kg)  10/25/13 161 lb (73.029 kg)     Past Medical History  Diagnosis Date  . Osteoarthritis   . Osteopenia   . Pseudodementia   . Diabetes mellitus without complication   . Chronic ischemic heart disease   . GERD (gastroesophageal reflux disease)   . Depression   . Anxiety   . Back pain, chronic   . Hypertension   . Hypercholesteremia   . Coronary atherosclerosis of native coronary artery   . Other and unspecified angina pectoris   . Claudication   . Postsurgical aortocoronary bypass status   . Postsurgical percutaneous transluminal coronary angioplasty status   . PAD (peripheral artery disease)   . ASCVD (arteriosclerotic cardiovascular  disease)     single vessel  . Occlusion and stenosis of carotid artery without mention of cerebral infarction     Last carotid doppler was in 2006. Patch angioplasty right femoral artery 01/1998, patenet 09/2005 ([facility])  . MRSA carrier     Chronic  . Right carotid bruit     Negative doppler 09/2002  . ASD (atrial septal defect)     Small ASD with insignificant shunt  . SOB (shortness of breath)     ECHO 04/13/12 EF estimated at 55-60%  . Edema   . Angina pectoris     Past Surgical History  Procedure Laterality Date  . Tubal ligation    . Coronary angioplasty with stent placement  01/15/98    PTCA stent RCA, PTCA diagonal  . Coronary angioplasty with stent placement  08/17/00    Cutting balloon ostial RCA  . Coronary angioplasty with stent placement  01/19/01    Unsuccessful attempted PTCA ostial RCA, stent protruding into root  . Coronary angioplasty with stent placement  01/20/01    RIMA-RCA, occluded cath 10/2003  . Coronary angioplasty with stent placement  02/02/04    Successful PTCA/drug eluding stent ostial RCA, [facility]    Current Outpatient Prescriptions  Medication Sig Dispense Refill  . albuterol (PROVENTIL HFA;VENTOLIN HFA) 108 (90 BASE) MCG/ACT inhaler Inhale 2 puffs into the lungs every 6 (six) hours as needed for wheezing.       Marland Kitchen. ALPRAZolam (XANAX) 1 MG tablet Take 0.5 tablets (0.5 mg total) by mouth at bedtime as  needed for sleep. For acute anxiety  30 tablet    . aspirin 81 MG tablet Take 81 mg by mouth daily.      Marland Kitchen. atorvastatin (LIPITOR) 80 MG tablet Take 80 mg by mouth daily.      Marland Kitchen. buPROPion (WELLBUTRIN XL) 150 MG 24 hr tablet Take 1 tablet (150 mg total) by mouth daily.  30 tablet  0  . cilostazol (PLETAL) 50 MG tablet Take 50 mg by mouth 2 (two) times daily.      . cyclobenzaprine (FLEXERIL) 10 MG tablet Take 10 mg by mouth 2 (two) times daily as needed for muscle spasms. For neck and muscle pain      . DULoxetine (CYMBALTA) 60 MG capsule Take 1 capsule (60  mg total) by mouth daily.      Marland Kitchen. esomeprazole (NEXIUM) 40 MG capsule Take 40 mg by mouth daily before breakfast.      . isosorbide mononitrate (IMDUR) 60 MG 24 hr tablet TAKE ONE TABLET BY MOUTH ONCE DAILY  30 tablet  1  . metFORMIN (GLUCOPHAGE) 500 MG tablet Take 500 mg by mouth daily with breakfast.      . nabumetone (RELAFEN) 500 MG tablet Take 1,000 mg by mouth 2 (two) times daily.      . nitroGLYCERIN (NITROSTAT) 0.4 MG SL tablet Place 0.4 mg under the tongue every 5 (five) minutes as needed for chest pain. x3 doses as needed for chest pain      . TOPROL XL 50 MG 24 hr tablet TAKE TWO TABLETS BY MOUTH ONCE DAILY TAKE WITH OR IMMEDIATELY FOLLOWING A MEAL *NEEDS OFFICE VISIT*  60 tablet  0   No current facility-administered medications for this visit.    Allergies:    Allergies  Allergen Reactions  . Bactrim [Sulfamethoxazole-Tmp Ds]   . Penicillins     Unknown   . Sulfa Antibiotics     unknown    Social History:  The patient  reports that she quit smoking about 3 years ago. Her smoking use included Cigarettes. She smoked 0.00 packs per day. She has never used smokeless tobacco. She reports that she does not drink alcohol or use illicit drugs.   ROS:  Please see the history of present illness.    Denies any fevers, chills, orthopnea, PND  PHYSICAL EXAM: VS:  BP 134/82  Pulse 66  Ht 5' (1.524 m)  Wt 163 lb 1.9 oz (73.991 kg)  BMI 31.86 kg/m2 Well nourished, well developed, in no acute distress HEENT: normal Neck: no JVD Cardiac:  Tachy RR; no murmur Lungs:  clear to auscultation bilaterally, no wheezing, rhonchi or rales Abd: soft, nontender, no hepatomegaly Ext: no edemaDecreased DP/PT Skin: warm and dry, chapped.  Neuro: no focal abnormalities noted  EKG:  Prior - Sinus tachycardia rate 116, possible atrial tachycardia, old inferior infarct pattern. Labs: 06/06/14 Triglycerides 179, HDL 38, LDL 81, hemoglobin A1c 6.2, creatinine 1.31, sodium 145, potassium 3.9, hemoglobin  13.4  ASSESSMENT AND PLAN:  1. Coronary artery disease-status post bypass, prior RCA stents.  2. Tachycardia-previously sinus tachycardia but possible atrial tachycardia at 116 beats per minute. I increased her Toprol from 50-100 mg this seemed to help significantly. 3. Angina, currently stable. Isosorbide, Toprol. Atypical chest pain off and on. Multiple somatic complaints. 4. ASD - small atrial septal defect corrected with patch during bypass. 5. Former smoker-good job with stopping tobacco. 6. Obesity-she states that she has not taken many calories. Her sons were athletes and she knows what  is right TEE. She just cannot lose weight. 7. PVD-currently on cilostazol/Pletal, ASA 81. Mild stenosis in the right common femoral artery, diffuse non-hemodynamically significant disease through superficial femoral arteries bilaterally. 3 vessel runoff bilaterally. Right ABI 0.86, left 0.92 8. 6 month f/u  Signed, Donato Schultz, MD [facility]  07/24/2014 2:21 PM

## 2014-08-04 ENCOUNTER — Other Ambulatory Visit: Payer: Self-pay | Admitting: Cardiology

## 2014-10-25 ENCOUNTER — Other Ambulatory Visit: Payer: Self-pay | Admitting: Cardiology

## 2015-01-23 ENCOUNTER — Ambulatory Visit (INDEPENDENT_AMBULATORY_CARE_PROVIDER_SITE_OTHER): Payer: Medicaid Other | Admitting: Cardiology

## 2015-01-23 ENCOUNTER — Encounter: Payer: Self-pay | Admitting: Cardiology

## 2015-01-23 VITALS — BP 126/74 | HR 62 | Ht 60.0 in | Wt 160.6 lb

## 2015-01-23 DIAGNOSIS — I2583 Coronary atherosclerosis due to lipid rich plaque: Principal | ICD-10-CM

## 2015-01-23 DIAGNOSIS — I2510 Atherosclerotic heart disease of native coronary artery without angina pectoris: Secondary | ICD-10-CM

## 2015-01-23 DIAGNOSIS — Z79899 Other long term (current) drug therapy: Secondary | ICD-10-CM

## 2015-01-23 DIAGNOSIS — I10 Essential (primary) hypertension: Secondary | ICD-10-CM | POA: Diagnosis not present

## 2015-01-23 DIAGNOSIS — E785 Hyperlipidemia, unspecified: Secondary | ICD-10-CM

## 2015-01-23 NOTE — Progress Notes (Signed)
[address] [place], [place]  [address] Phone: [phone] Fax:  [phone]  Date:  01/23/2015   ID:  Crystal [name], DOB 1951-01-08, MRN [id number]  PCP:  Crystal [name]   History of Present Illness: Crystal [name] is a 64 y.o. female with coronary artery disease,bypass, 3 prior ostial RCA stents according to records patent by nonselective coronary angiography on 06/19/08. Prior bypass, RIMA to RCA was occluded on catheter 2/05. Small ASD, peripheral vascular disease with patch angioplasty to right femoral artery here for follow up.   Mild chest pain at times. Pressure. Can happen at rest and movement. No change.   Has seven kids. 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  Discussed hyperlipidemia. Goal LDL is less than 70. Multiple drug regimen as above.   Since starting the Toprol, heart rate has decreased significantly. She is feeling better. Occasional atypical chest discomfort. Peripheral vascular lab preliminarily shows 3 vessel runoff bilaterally, mild stenosis of the right CFA, diffuse non-hemodynamically significant disease throughout the SFAs bilaterally.    Wt Readings from Last 3 Encounters:  01/23/15 160 lb 9.6 oz (72.848 kg)  07/24/14 163 lb 1.9 oz (73.991 kg)  11/04/13 162 lb (73.483 kg)     Past Medical History  Diagnosis Date  . Osteoarthritis   . Osteopenia   . Pseudodementia   . Diabetes mellitus without complication   . Chronic ischemic heart disease   . GERD (gastroesophageal reflux disease)   . Depression   . Anxiety   . Back pain, chronic   . Hypertension   . Hypercholesteremia   . Coronary atherosclerosis of native coronary artery   . Other and unspecified angina pectoris   . Claudication   . Postsurgical aortocoronary bypass status   . Postsurgical percutaneous transluminal coronary angioplasty status   . PAD (peripheral artery disease)   . ASCVD (arteriosclerotic  cardiovascular disease)     single vessel  . Occlusion and stenosis of carotid artery without mention of cerebral infarction     Last carotid doppler was in 2006. Patch angioplasty right femoral artery 01/1998, patenet 09/2005 (Crystal [name])  . MRSA carrier     Chronic  . Right carotid bruit     Negative doppler 09/2002  . ASD (atrial septal defect)     Small ASD with insignificant shunt  . SOB (shortness of breath)     ECHO 04/13/12 EF estimated at 55-60%  . Edema   . Angina pectoris     Past Surgical History  Procedure Laterality Date  . Tubal ligation    . Coronary angioplasty with stent placement  01/15/98    PTCA stent RCA, PTCA diagonal  . Coronary angioplasty with stent placement  08/17/00    Cutting balloon ostial RCA  . Coronary angioplasty with stent placement  01/19/01    Unsuccessful attempted PTCA ostial RCA, stent protruding into root  . Coronary angioplasty with stent placement  01/20/01    RIMA-RCA, occluded cath 10/2003  . Coronary angioplasty with stent placement  02/02/04    Successful PTCA/drug eluding stent ostial RCA, [facility]    Current Outpatient Prescriptions  Medication Sig Dispense Refill  . albuterol (PROVENTIL HFA;VENTOLIN HFA) 108 (90 BASE) MCG/ACT inhaler Inhale 2 puffs into the lungs every 6 (six) hours as needed for wheezing.     Marland Kitchen aspirin 81 MG tablet Take 81 mg by mouth daily.    Marland Kitchen atorvastatin (LIPITOR)  80 MG tablet Take 80 mg by mouth daily.    Marland Kitchen buPROPion (WELLBUTRIN XL) 150 MG 24 hr tablet Take 1 tablet (150 mg total) by mouth daily. 30 tablet 0  . calcium-vitamin D (OSCAL WITH D) 500-200 MG-UNIT per tablet Take 1 tablet by mouth daily. 500 mg by mouth daily    . cholecalciferol (VITAMIN D) 400 UNITS TABS tablet Take 1,000 Units by mouth daily.    . cilostazol (PLETAL) 50 MG tablet Take 50 mg by mouth 2 (two) times daily.    . clonazePAM (KLONOPIN) 1 MG tablet Take 1 mg by mouth daily.    . DULoxetine (CYMBALTA) 60 MG capsule Take 1 capsule (60 mg  total) by mouth daily.    Marland Kitchen esomeprazole (NEXIUM) 40 MG capsule Take 40 mg by mouth daily before breakfast.    . isosorbide mononitrate (IMDUR) 60 MG 24 hr tablet TAKE ONE TABLET BY MOUTH ONCE DAILY 30 tablet 3  . metFORMIN (GLUCOPHAGE) 500 MG tablet Take 500 mg by mouth daily with breakfast.    . nabumetone (RELAFEN) 500 MG tablet Take 1,000 mg by mouth 2 (two) times daily.    . nitroGLYCERIN (NITROSTAT) 0.4 MG SL tablet Place 0.4 mg under the tongue every 5 (five) minutes as needed for chest pain. x3 doses as needed for chest pain    . TOPROL XL 50 MG 24 hr tablet TAKE TWO TABLETS BY MOUTH ONCE DAILY TAKE WITH OR IMMEDIATELY FOLLOWING A MEAL *NEEDS OFFICE VISIT* 60 tablet 6  . vitamin B-12 (CYANOCOBALAMIN) 1000 MCG tablet Take 2,000 mcg by mouth daily.    Marland Kitchen ALPRAZolam (XANAX) 1 MG tablet Take 0.5 tablets (0.5 mg total) by mouth at bedtime as needed for sleep. For acute anxiety (Patient not taking: Reported on 01/23/2015) 30 tablet   . cyclobenzaprine (FLEXERIL) 10 MG tablet Take 10 mg by mouth 2 (two) times daily as needed for muscle spasms. For neck and muscle pain     No current facility-administered medications for this visit.    Allergies:    Allergies  Allergen Reactions  . Bactrim [Sulfamethoxazole-Trimethoprim]   . Penicillins     Unknown   . Sulfa Antibiotics     unknown    Social History:  The patient  reports that she quit smoking about 4 years ago. Her smoking use included Cigarettes. She has never used smokeless tobacco. She reports that she does not drink alcohol or use illicit drugs.   ROS:  Please see the history of present illness.    Denies any fevers, chills, orthopnea, PND  PHYSICAL EXAM: VS:  BP 126/74 mmHg  Pulse 62  Ht 5' (1.524 m)  Wt 160 lb 9.6 oz (72.848 kg)  BMI 31.37 kg/m2 Well nourished, well developed, in no acute distress HEENT: normal Neck: no JVD Cardiac:  Tachy RR; no murmur Lungs:  clear to auscultation bilaterally, no wheezing, rhonchi or  rales Abd: soft, nontender, no hepatomegaly Ext: no edemaDecreased DP/PT Skin: warm and dry, chapped.  Neuro: no focal abnormalities noted  EKG:  Today, 01/23/15-normal sinus rhythm, 62, nonspecific ST-T wave changes, borderline prolonged QT. Prior - Sinus tachycardia rate 116, possible atrial tachycardia, old inferior infarct pattern.  Labs: 06/06/14 Triglycerides 179, HDL 38, LDL 81, hemoglobin A1c 6.2, creatinine 1.31, sodium 145, potassium 3.9, hemoglobin 13.4  ASSESSMENT AND PLAN:  1. Coronary artery disease-status post bypass, prior RCA stents.  2. Tachycardia-previously sinus tachycardia but possible atrial tachycardia at 116 beats per minute. I increased her Toprol from  50-100 mg this seemed to help significantly. Heart rate currently is in the 60s. 3. Angina, currently stable. Isosorbide, Toprol. Atypical chest pain off and on. Multiple somatic complaints. 4. ASD - small atrial septal defect corrected with patch during bypass. 5. Former smoker-good job with stopping tobacco. 6. Obesity-she states that she has not taken many calories. Her sons were athletes and she knows what is right TEE. She just cannot lose weight. 7. PVD-currently on cilostazol/Pletal, ASA 81. She was on this prior to seeing me. I have continued this drug regimen. In 2015-Doppler showed Mild stenosis in the right common femoral artery, diffuse non-hemodynamically significant disease through superficial femoral arteries bilaterally. 3 vessel runoff bilaterally. Right ABI 0.86, left 0.92. Continue to encourage exercise. No bleeding. 8. Hyperlipidemia-currently on atorvastatin. We will check lipid profile. ALT as well. 9. 6 month f/u  Signed, Donato SchultzMark Skains, MD [facility]  01/23/2015 4:30 PM

## 2015-01-23 NOTE — Patient Instructions (Signed)
Medication Instructions:  Your physician recommends that you continue on your current medications as directed. Please refer to the Current Medication list given to you today.   Labwork: Lipid and ALT   Testing/Procedures:  NONE  Follow-Up:  Follow up in 6 months with Dr. Anne FuSkains.  You will receive a letter in the mail 2 months before you are due.  Please call us when you receive this letter to schedule your follow up appointment.   Thank you for choosing [facility]!!

## 2015-01-24 LAB — LIPID PANEL
Cholesterol: 153 mg/dL (ref 0–200)
HDL: 32.6 mg/dL — ABNORMAL LOW (ref >39.00)
LDL Cholesterol: 81 mg/dL (ref 0–99)
NonHDL: 120.4
Total CHOL/HDL Ratio: 5
Triglycerides: 197 mg/dL — ABNORMAL HIGH (ref 0.0–149.0)
VLDL: 39.4 mg/dL (ref 0.0–40.0)

## 2015-01-24 LAB — ALT: ALT: 3 U/L (ref 0–35)

## 2015-03-22 NOTE — Addendum Note (Signed)
Addended by: Reesa Chew on: 03/22/2015 09:30 AM   Modules accepted: Orders

## 2015-03-29 ENCOUNTER — Other Ambulatory Visit: Payer: Self-pay | Admitting: Cardiology

## 2015-04-09 ENCOUNTER — Other Ambulatory Visit: Payer: Self-pay

## 2015-04-09 MED ORDER — NITROGLYCERIN 0.4 MG SL SUBL: 0.4000 mg | SUBLINGUAL_TABLET | SUBLINGUAL | Status: DC | PRN

## 2015-04-28 ENCOUNTER — Other Ambulatory Visit: Payer: Self-pay | Admitting: Cardiology

## 2015-07-23 ENCOUNTER — Encounter: Payer: Self-pay | Admitting: Cardiology

## 2015-07-23 ENCOUNTER — Ambulatory Visit (INDEPENDENT_AMBULATORY_CARE_PROVIDER_SITE_OTHER): Payer: Medicaid Other | Admitting: Cardiology

## 2015-07-23 VITALS — BP 130/70 | HR 68 | Ht 60.0 in | Wt 164.1 lb

## 2015-07-23 DIAGNOSIS — I10 Essential (primary) hypertension: Secondary | ICD-10-CM | POA: Diagnosis not present

## 2015-07-23 DIAGNOSIS — I2510 Atherosclerotic heart disease of native coronary artery without angina pectoris: Secondary | ICD-10-CM | POA: Diagnosis not present

## 2015-07-23 DIAGNOSIS — I2583 Coronary atherosclerosis due to lipid rich plaque: Secondary | ICD-10-CM

## 2015-07-23 DIAGNOSIS — E669 Obesity, unspecified: Secondary | ICD-10-CM | POA: Diagnosis not present

## 2015-07-23 DIAGNOSIS — I739 Peripheral vascular disease, unspecified: Secondary | ICD-10-CM

## 2015-07-23 NOTE — Patient Instructions (Signed)
Medication Instructions:  The current medical regimen is effective;  continue present plan and medications.  Follow-Up: Follow up in 6 months with Dr. Skains.  You will receive a letter in the mail 2 months before you are due.  Please call us when you receive this letter to schedule your follow up appointment.  Thank you for choosing [facility]!!     

## 2015-07-23 NOTE — Progress Notes (Signed)
[address] [place], [place]  [address] Phone: [phone] Fax:  [phone]  Date:  07/23/2015   ID:  Crystal [name], DOB [birth year], MRN [id number]  PCP:  Arlyss Queen   History of Present Illness: Crystal [name] is a 64 y.o. female with coronary artery disease,bypass, 3 prior ostial RCA stents according to records patent by nonselective coronary angiography on 06/19/08. Prior bypass, RIMA to RCA was occluded on catheterization in 2/05. Small ASD, peripheral vascular disease with patch angioplasty to right femoral artery here for follow up.   Mild chest pain at times. Has not changed, very minor she states. Can happen at rest and movement. No change.   Has seven kids. 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  Discussed hyperlipidemia. Goal LDL is less than 70. Last check 81. Multiple drug regimen as above.   Since starting the Toprol, heart rate has decreased significantly. She is feeling better. Occasional atypical chest discomfort. Peripheral vascular lab preliminarily shows 3 vessel runoff bilaterally, mild stenosis of the right CFA, diffuse non-hemodynamically significant disease throughout the SFAs bilaterally.    Wt Readings from Last 3 Encounters:  07/23/15 164 lb 1.9 oz (74.444 kg)  01/23/15 160 lb 9.6 oz (72.848 kg)  07/24/14 163 lb 1.9 oz (73.991 kg)     Past Medical History  Diagnosis Date  . Osteoarthritis   . Osteopenia   . Pseudodementia   . Diabetes mellitus without complication (HCC)   . Chronic ischemic heart disease   . GERD (gastroesophageal reflux disease)   . Depression   . Anxiety   . Back pain, chronic   . Hypertension   . Hypercholesteremia   . Coronary atherosclerosis of native coronary artery   . Other and unspecified angina pectoris   . Claudication (HCC)   . Postsurgical aortocoronary bypass status   . Postsurgical percutaneous transluminal  coronary angioplasty status   . PAD (peripheral artery disease) (HCC)   . ASCVD (arteriosclerotic cardiovascular disease)     single vessel  . Occlusion and stenosis of carotid artery without mention of cerebral infarction     Last carotid doppler was in 2006. Patch angioplasty right femoral artery 01/1998, patenet 09/2005 ([facility])  . MRSA carrier     Chronic  . Right carotid bruit     Negative doppler 09/2002  . ASD (atrial septal defect)     Small ASD with insignificant shunt  . SOB (shortness of breath)     ECHO 04/13/12 EF estimated at 55-60%  . Edema   . Angina pectoris [facility])     Past Surgical History  Procedure Laterality Date  . Tubal ligation    . Coronary angioplasty with stent placement  01/15/98    PTCA stent RCA, PTCA diagonal  . Coronary angioplasty with stent placement  08/17/00    Cutting balloon ostial RCA  . Coronary angioplasty with stent placement  01/19/01    Unsuccessful attempted PTCA ostial RCA, stent protruding into root  . Coronary angioplasty with stent placement  01/20/01    RIMA-RCA, occluded cath 10/2003  . Coronary angioplasty with stent placement  02/02/04    Successful PTCA/drug eluding stent ostial RCA, [facility]    Current Outpatient Prescriptions  Medication Sig Dispense Refill  . albuterol (PROVENTIL HFA;VENTOLIN HFA) 108 (90 BASE) MCG/ACT inhaler Inhale 2 puffs into the lungs every 6 (six) hours as needed for  wheezing.     Marland Kitchen. aspirin 81 MG tablet Take 81 mg by mouth daily.    Marland Kitchen. atorvastatin (LIPITOR) 80 MG tablet Take 80 mg by mouth daily.    Marland Kitchen. buPROPion (WELLBUTRIN XL) 150 MG 24 hr tablet Take 1 tablet (150 mg total) by mouth daily. 30 tablet 0  . calcium-vitamin D (OSCAL WITH D) 500-200 MG-UNIT per tablet Take 1 tablet by mouth daily. 500 mg by mouth daily    . cholecalciferol (VITAMIN D) 400 UNITS TABS tablet Take 1,000 Units by mouth daily.    . cilostazol (PLETAL) 50 MG tablet Take 50 mg by mouth 2 (two) times daily.    . clonazePAM  (KLONOPIN) 1 MG tablet Take 1 mg by mouth daily.    . DULoxetine (CYMBALTA) 60 MG capsule Take 1 capsule (60 mg total) by mouth daily.    Marland Kitchen. esomeprazole (NEXIUM) 40 MG capsule Take 40 mg by mouth daily before breakfast.    . isosorbide mononitrate (IMDUR) 60 MG 24 hr tablet TAKE ONE TABLET BY MOUTH ONCE DAILY 30 tablet 3  . metFORMIN (GLUCOPHAGE) 500 MG tablet Take 500 mg by mouth daily with breakfast.    . metoprolol succinate (TOPROL-XL) 50 MG 24 hr tablet TAKE TWO TABLETS BY MOUTH ONCE DAILY TAKE WITH OR IMMEDIATELY FOLLOWING A MEAL *NEEDS OFFICE VISIT* 68 tablet 9  . nabumetone (RELAFEN) 500 MG tablet Take 1,000 mg by mouth 2 (two) times daily.    . nitroGLYCERIN (NITROSTAT) 0.4 MG SL tablet Place 1 tablet (0.4 mg total) under the tongue every 5 (five) minutes as needed for chest pain. x3 doses as needed for chest pain 25 tablet 3  . vitamin B-12 (CYANOCOBALAMIN) 1000 MCG tablet Take 2,000 mcg by mouth daily.     No current facility-administered medications for this visit.    Allergies:    Allergies  Allergen Reactions  . Bactrim [Sulfamethoxazole-Trimethoprim]   . Penicillins     Unknown   . Sulfa Antibiotics     unknown    Social History:  The patient  reports that she quit smoking about 4 years ago. Her smoking use included Cigarettes. She has never used smokeless tobacco. She reports that she does not drink alcohol or use illicit drugs.   ROS:  Please see the history of present illness.    Denies any fevers, chills, orthopnea, PND  PHYSICAL EXAM: VS:  BP 130/70 mmHg  Pulse 68  Ht 5' (1.524 m)  Wt 164 lb 1.9 oz (74.444 kg)  BMI 32.05 kg/m2 Well nourished, well developed, in no acute distress HEENT: normal Neck: no JVD Cardiac:  Tachy RR; no murmur Lungs:  clear to auscultation bilaterally, no wheezing, rhonchi or rales Abd: soft, nontender, no hepatomegaly Ext: no edemaDecreased DP/PT Skin: warm and dry, chapped.  Neuro: no focal abnormalities noted  EKG:  Today,  01/23/15-normal sinus rhythm, 62, nonspecific ST-T wave changes, borderline prolonged QT. Prior - Sinus tachycardia rate 116, possible atrial tachycardia, old inferior infarct pattern.  Labs: 06/06/14 Triglycerides 179, HDL 38, LDL 81, hemoglobin A1c 6.2, creatinine 1.31, sodium 145, potassium 3.9, hemoglobin 13.4  ASSESSMENT AND PLAN:  1. Coronary artery disease-status post bypass, prior RCA stents.  2. Tachycardia-previously sinus tachycardia but possible atrial tachycardia at 116 beats per minute. I increased her Toprol from 50-100 mg this seemed to help significantly. Heart rate currently is in the 60s. 3. Angina, currently stable. Isosorbide, Toprol. Atypical chest pain off and on. Multiple somatic complaints. 4. ASD - small atrial  septal defect corrected with patch during bypass. 5. Former smoker-good job with stopping tobacco. 6. Obesity-she states that she has not taken many calories. Her sons were athletes and she knows what is right to eat. She just cannot lose weight. Rice, cereals. Asked her to try to decrease carbohydrates. 7. PVD-currently on cilostazol/Pletal, ASA 81. She was on this prior to seeing me. I have continued this drug regimen. In 2015-Doppler showed Mild stenosis in the right common femoral artery, diffuse non-hemodynamically significant disease through superficial femoral arteries bilaterally. 3 vessel runoff bilaterally. Right ABI 0.86, left 0.92. Continue to encourage exercise. No bleeding. Perhaps some of her numbness at times is more neuropathic. 8. Hyperlipidemia-currently on atorvastatin. LDL 81. 9. 6 month f/u  Signed, Donato Schultz, MD [facility]  07/23/2015 10:36 AM

## 2015-07-30 ENCOUNTER — Other Ambulatory Visit: Payer: Self-pay | Admitting: *Deleted

## 2015-07-30 MED ORDER — ISOSORBIDE MONONITRATE ER 60 MG PO TB24: 60.0000 mg | ORAL_TABLET | Freq: Every day | ORAL | Status: DC

## 2015-08-30 ENCOUNTER — Encounter: Payer: Self-pay | Admitting: Cardiology

## 2016-01-02 DIAGNOSIS — L309 Dermatitis, unspecified: Secondary | ICD-10-CM | POA: Diagnosis not present

## 2016-01-02 DIAGNOSIS — K219 Gastro-esophageal reflux disease without esophagitis: Secondary | ICD-10-CM | POA: Diagnosis not present

## 2016-01-02 DIAGNOSIS — F418 Other specified anxiety disorders: Secondary | ICD-10-CM | POA: Diagnosis not present

## 2016-01-02 DIAGNOSIS — Z6830 Body mass index (BMI) 30.0-30.9, adult: Secondary | ICD-10-CM | POA: Diagnosis not present

## 2016-01-02 DIAGNOSIS — M542 Cervicalgia: Secondary | ICD-10-CM | POA: Diagnosis not present

## 2016-01-21 ENCOUNTER — Encounter: Payer: Self-pay | Admitting: Cardiology

## 2016-01-21 ENCOUNTER — Ambulatory Visit (INDEPENDENT_AMBULATORY_CARE_PROVIDER_SITE_OTHER): Payer: Medicare Other | Admitting: Cardiology

## 2016-01-21 VITALS — BP 126/68 | HR 66 | Ht 60.0 in | Wt 154.8 lb

## 2016-01-21 DIAGNOSIS — E669 Obesity, unspecified: Secondary | ICD-10-CM | POA: Diagnosis not present

## 2016-01-21 DIAGNOSIS — Z79899 Other long term (current) drug therapy: Secondary | ICD-10-CM

## 2016-01-21 DIAGNOSIS — I2510 Atherosclerotic heart disease of native coronary artery without angina pectoris: Secondary | ICD-10-CM | POA: Diagnosis not present

## 2016-01-21 DIAGNOSIS — I10 Essential (primary) hypertension: Secondary | ICD-10-CM | POA: Diagnosis not present

## 2016-01-21 DIAGNOSIS — I739 Peripheral vascular disease, unspecified: Secondary | ICD-10-CM

## 2016-01-21 DIAGNOSIS — E785 Hyperlipidemia, unspecified: Secondary | ICD-10-CM

## 2016-01-21 DIAGNOSIS — R0989 Other specified symptoms and signs involving the circulatory and respiratory systems: Secondary | ICD-10-CM

## 2016-01-21 DIAGNOSIS — I2583 Coronary atherosclerosis due to lipid rich plaque: Principal | ICD-10-CM

## 2016-01-21 NOTE — Patient Instructions (Signed)
Medication Instructions:  The current medical regimen is effective;  continue present plan and medications.  Testing/Procedures: Your physician has requested that you have a carotid duplex. This test is an ultrasound of the carotid arteries in your neck. It looks at blood flow through these arteries that supply the brain with blood. Allow one hour for this exam. There are no restrictions or special instructions.  Follow-Up: Follow up in 1 year with Dr. Skains.  You will receive a letter in the mail 2 months before you are due.  Please call us when you receive this letter to schedule your follow up appointment.   If you need a refill on your cardiac medications before your next appointment, please call your pharmacy.  Thank you for choosing [facility]!!       

## 2016-01-21 NOTE — Progress Notes (Signed)
[address] [place], [place]  [address] Phone: [phone] Fax:  [phone]  Date:  01/21/2016   ID:  Crystal [name], DOB 09/29/1951, MRN [id number]  PCP:  Arlyss QueenONROY,NATHAN, PA-C   History of Present Illness: Crystal [name] is a 65 y.o. female with coronary artery disease,bypass, 3 prior ostial RCA stents according to records patent by nonselective coronary angiography on 06/19/08. Prior bypass, RIMA to RCA was occluded on catheterization in 2/05. Small ASD, peripheral vascular disease with patch angioplasty to right femoral artery here for follow up.   Mild chest pain at times. Has not changed, very minor she states. Can happen at rest and movement. No change.   Has seven kids. Stress. 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  Discussed hyperlipidemia. Goal LDL is less than 70. Last check 81. Multiple drug regimen as above.   Since starting the Toprol, heart rate has decreased significantly. She is feeling better. Occasional atypical chest discomfort. Peripheral vascular lab preliminarily shows 3 vessel runoff bilaterally, mild stenosis of the right CFA, diffuse non-hemodynamically significant disease throughout the SFAs bilaterally. Dr. early has seen in the past. 2015 lower extremity Doppler done  Overall she is pleased with how she is doing. No significant changes in symptoms.    Wt Readings from Last 3 Encounters:  01/21/16 154 lb 12.8 oz (70.217 kg)  07/23/15 164 lb 1.9 oz (74.444 kg)  01/23/15 160 lb 9.6 oz (72.848 kg)     Past Medical History  Diagnosis Date  . Osteoarthritis   . Osteopenia   . Pseudodementia   . Diabetes mellitus without complication (HCC)   . Chronic ischemic heart disease   . GERD (gastroesophageal reflux disease)   . Depression   . Anxiety   . Back pain, chronic   . Hypertension   . Hypercholesteremia   . Coronary atherosclerosis of native coronary artery   . Other  and unspecified angina pectoris   . Claudication (HCC)   . Postsurgical aortocoronary bypass status   . Postsurgical percutaneous transluminal coronary angioplasty status   . PAD (peripheral artery disease) (HCC)   . ASCVD (arteriosclerotic cardiovascular disease)     single vessel  . Occlusion and stenosis of carotid artery without mention of cerebral infarction     Last carotid doppler was in 2006. Patch angioplasty right femoral artery 01/1998, patenet 09/2005 ([facility])  . MRSA carrier     Chronic  . Right carotid bruit     Negative doppler 09/2002  . ASD (atrial septal defect)     Small ASD with insignificant shunt  . SOB (shortness of breath)     ECHO 04/13/12 EF estimated at 55-60%  . Edema   . Angina pectoris [facility](HCC)     Past Surgical History  Procedure Laterality Date  . Tubal ligation    . Coronary angioplasty with stent placement  01/15/98    PTCA stent RCA, PTCA diagonal  . Coronary angioplasty with stent placement  08/17/00    Cutting balloon ostial RCA  . Coronary angioplasty with stent placement  01/19/01    Unsuccessful attempted PTCA ostial RCA, stent protruding into root  . Coronary angioplasty with stent placement  01/20/01    RIMA-RCA, occluded cath 10/2003  . Coronary angioplasty with stent placement  02/02/04    Successful PTCA/drug eluding stent ostial RCA, [facility]    Current Outpatient Prescriptions  Medication  Sig Dispense Refill  . albuterol (PROVENTIL HFA;VENTOLIN HFA) 108 (90 BASE) MCG/ACT inhaler Inhale 2 puffs into the lungs every 6 (six) hours as needed for wheezing.     Marland Kitchen aspirin 81 MG tablet Take 81 mg by mouth daily.    Marland Kitchen atorvastatin (LIPITOR) 80 MG tablet Take 80 mg by mouth daily.    Marland Kitchen buPROPion (WELLBUTRIN XL) 300 MG 24 hr tablet Take 300 mg by mouth daily.    . calcium-vitamin D (OSCAL WITH D) 500-200 MG-UNIT per tablet Take 1 tablet by mouth daily. 500 mg by mouth daily    . cholecalciferol (VITAMIN D) 400 UNITS TABS tablet Take 1,000 Units by  mouth daily.    . cilostazol (PLETAL) 50 MG tablet Take 50 mg by mouth 2 (two) times daily.    . clonazePAM (KLONOPIN) 1 MG tablet Take 1 mg by mouth daily.    . DULoxetine (CYMBALTA) 60 MG capsule Take 1 capsule (60 mg total) by mouth daily.    Marland Kitchen esomeprazole (NEXIUM) 40 MG capsule Take 40 mg by mouth daily before breakfast.    . isosorbide mononitrate (IMDUR) 60 MG 24 hr tablet Take 1 tablet (60 mg total) by mouth daily. 30 tablet 11  . meclizine (ANTIVERT) 25 MG tablet Take 25 mg by mouth daily.    . metFORMIN (GLUCOPHAGE) 500 MG tablet Take 500 mg by mouth daily with breakfast.    . metoprolol succinate (TOPROL-XL) 50 MG 24 hr tablet TAKE TWO TABLETS BY MOUTH ONCE DAILY TAKE WITH OR IMMEDIATELY FOLLOWING A MEAL *NEEDS OFFICE VISIT* 68 tablet 9  . nabumetone (RELAFEN) 500 MG tablet Take 1,000 mg by mouth 2 (two) times daily.    . nitroGLYCERIN (NITROSTAT) 0.4 MG SL tablet Place 1 tablet (0.4 mg total) under the tongue every 5 (five) minutes as needed for chest pain. x3 doses as needed for chest pain 25 tablet 3  . omeprazole (PRILOSEC) 40 MG capsule Take 40 mg by mouth daily.    . vitamin B-12 (CYANOCOBALAMIN) 1000 MCG tablet Take 2,000 mcg by mouth daily.     No current facility-administered medications for this visit.    Allergies:    Allergies  Allergen Reactions  . Bactrim [Sulfamethoxazole-Trimethoprim] Nausea And Vomiting  . Penicillins     Unknown   . Sulfa Antibiotics     unknown    Social History:  The patient  reports that she quit smoking about 5 years ago. Her smoking use included Cigarettes. She has never used smokeless tobacco. She reports that she does not drink alcohol or use illicit drugs.   ROS:  Please see the history of present illness.    Denies any fevers, chills, orthopnea, PND  PHYSICAL EXAM: VS:  BP 126/68 mmHg  Pulse 66  Ht 5' (1.524 m)  Wt 154 lb 12.8 oz (70.217 kg)  BMI 30.23 kg/m2 Well nourished, well developed, in no acute distress HEENT:  normal Neck: no JVD, mild carotid bruits heard bilaterally Cardiac:  RRR; no murmur Lungs:  clear to auscultation bilaterally, no wheezing, rhonchi or rales Abd: soft, nontender, no hepatomegaly Ext: no edemaDecreased DP/PT Skin: warm and dry, chapped.  Neuro: no focal abnormalities noted  EKG:  Today, 01/23/15-normal sinus rhythm, 62, nonspecific ST-T wave changes, borderline prolonged QT. Prior - Sinus tachycardia rate 116, possible atrial tachycardia, old inferior infarct pattern.  Labs: 06/06/14 Triglycerides 179, HDL 38, LDL 81, hemoglobin A1c 6.2, creatinine 1.31, sodium 145, potassium 3.9, hemoglobin 13.4  11/04/2013: LE Doppler Mildly reduced  flow, right ABI. Overall no need for further intervention or evaluation. Continue with exercise. Overall reassuring.   ASSESSMENT AND PLAN:  Coronary artery disease-status post bypass, prior RCA stents.   Tachycardia-previously sinus tachycardia but possible atrial tachycardia at 116 beats per minute. I increased her Toprol from 50-100 mg this seemed to help significantly. Heart rate currently is in the 60s.  Angina, currently stable. Isosorbide, Toprol. Atypical chest pain off and on. Multiple somatic complaints previously, overall doing well today.  ASD - small atrial septal defect corrected with patch during bypass.  Former smoker-good job with stopping tobacco.  Obesity-good job with weight loss, proximally 10 pounds. she states that she has not taken many calories. Her sons were athletes and she knows what is right to eat. She just cannot lose weight. Rice, cereals. Asked her to try to continue decrease carbohydrates.  PVD-currently on cilostazol/Pletal, ASA 81. She was on this prior to seeing me. I have continued this drug regimen. In 2015-Doppler showed Mild stenosis in the right common femoral artery, diffuse non-hemodynamically significant disease through superficial femoral arteries bilaterally. 3 vessel runoff bilaterally. Right ABI  0.86, left 0.92. Continue to encourage exercise. No bleeding. Perhaps some of her numbness at times is more neuropathic. Overall, no significant change.  Carotid bruits bilaterally-I will check carotid ultrasound given her prior history of peripheral vascular disease  Hyperlipidemia-currently on atorvastatin. LDL 81.  12 month f/u  Signed, Donato Schultz, MD [facility]  01/21/2016 10:24 AM

## 2016-01-23 DIAGNOSIS — F418 Other specified anxiety disorders: Secondary | ICD-10-CM | POA: Diagnosis not present

## 2016-01-23 DIAGNOSIS — Z9181 History of falling: Secondary | ICD-10-CM | POA: Diagnosis not present

## 2016-01-23 DIAGNOSIS — R6889 Other general symptoms and signs: Secondary | ICD-10-CM | POA: Diagnosis not present

## 2016-01-23 DIAGNOSIS — J069 Acute upper respiratory infection, unspecified: Secondary | ICD-10-CM | POA: Diagnosis not present

## 2016-01-23 DIAGNOSIS — Z1389 Encounter for screening for other disorder: Secondary | ICD-10-CM | POA: Diagnosis not present

## 2016-01-23 DIAGNOSIS — Z6829 Body mass index (BMI) 29.0-29.9, adult: Secondary | ICD-10-CM | POA: Diagnosis not present

## 2016-01-23 DIAGNOSIS — N390 Urinary tract infection, site not specified: Secondary | ICD-10-CM | POA: Diagnosis not present

## 2016-01-25 ENCOUNTER — Ambulatory Visit (HOSPITAL_COMMUNITY)
Admission: RE | Admit: 2016-01-25 | Discharge: 2016-01-25 | Disposition: A | Discharge status: Home / Self Care | Payer: Medicare Other | Source: Ambulatory Visit | Attending: Cardiovascular Disease | Admitting: Cardiovascular Disease

## 2016-01-25 DIAGNOSIS — I2510 Atherosclerotic heart disease of native coronary artery without angina pectoris: Secondary | ICD-10-CM | POA: Insufficient documentation

## 2016-01-25 DIAGNOSIS — E7800 Pure hypercholesterolemia, unspecified: Secondary | ICD-10-CM | POA: Diagnosis not present

## 2016-01-25 DIAGNOSIS — I6523 Occlusion and stenosis of bilateral carotid arteries: Secondary | ICD-10-CM | POA: Insufficient documentation

## 2016-01-25 DIAGNOSIS — K219 Gastro-esophageal reflux disease without esophagitis: Secondary | ICD-10-CM | POA: Diagnosis not present

## 2016-01-25 DIAGNOSIS — E119 Type 2 diabetes mellitus without complications: Secondary | ICD-10-CM | POA: Diagnosis not present

## 2016-01-25 DIAGNOSIS — I2583 Coronary atherosclerosis due to lipid rich plaque: Secondary | ICD-10-CM

## 2016-01-25 DIAGNOSIS — I10 Essential (primary) hypertension: Secondary | ICD-10-CM | POA: Diagnosis not present

## 2016-01-25 DIAGNOSIS — R0989 Other specified symptoms and signs involving the circulatory and respiratory systems: Secondary | ICD-10-CM | POA: Diagnosis present

## 2016-01-31 ENCOUNTER — Encounter: Payer: Self-pay | Admitting: *Deleted

## 2016-02-27 DIAGNOSIS — K219 Gastro-esophageal reflux disease without esophagitis: Secondary | ICD-10-CM | POA: Diagnosis not present

## 2016-02-27 DIAGNOSIS — F418 Other specified anxiety disorders: Secondary | ICD-10-CM | POA: Diagnosis not present

## 2016-02-27 DIAGNOSIS — E7800 Pure hypercholesterolemia, unspecified: Secondary | ICD-10-CM | POA: Diagnosis not present

## 2016-02-27 DIAGNOSIS — I10 Essential (primary) hypertension: Secondary | ICD-10-CM | POA: Diagnosis not present

## 2016-02-27 DIAGNOSIS — I259 Chronic ischemic heart disease, unspecified: Secondary | ICD-10-CM | POA: Diagnosis not present

## 2016-02-27 DIAGNOSIS — I739 Peripheral vascular disease, unspecified: Secondary | ICD-10-CM | POA: Diagnosis not present

## 2016-02-27 DIAGNOSIS — E119 Type 2 diabetes mellitus without complications: Secondary | ICD-10-CM | POA: Diagnosis not present

## 2016-02-27 DIAGNOSIS — M545 Low back pain: Secondary | ICD-10-CM | POA: Diagnosis not present

## 2016-02-27 DIAGNOSIS — Z6828 Body mass index (BMI) 28.0-28.9, adult: Secondary | ICD-10-CM | POA: Diagnosis not present

## 2016-03-03 ENCOUNTER — Encounter: Payer: Self-pay | Admitting: Cardiology

## 2016-03-18 DIAGNOSIS — F332 Major depressive disorder, recurrent severe without psychotic features: Secondary | ICD-10-CM | POA: Diagnosis not present

## 2016-03-18 DIAGNOSIS — F4310 Post-traumatic stress disorder, unspecified: Secondary | ICD-10-CM | POA: Diagnosis not present

## 2016-04-09 DIAGNOSIS — F4310 Post-traumatic stress disorder, unspecified: Secondary | ICD-10-CM | POA: Diagnosis not present

## 2016-04-09 DIAGNOSIS — F332 Major depressive disorder, recurrent severe without psychotic features: Secondary | ICD-10-CM | POA: Diagnosis not present

## 2016-05-26 DIAGNOSIS — F332 Major depressive disorder, recurrent severe without psychotic features: Secondary | ICD-10-CM | POA: Diagnosis not present

## 2016-05-26 DIAGNOSIS — F4310 Post-traumatic stress disorder, unspecified: Secondary | ICD-10-CM | POA: Diagnosis not present

## 2016-05-28 ENCOUNTER — Other Ambulatory Visit: Payer: Self-pay | Admitting: Cardiology

## 2016-06-19 DIAGNOSIS — L309 Dermatitis, unspecified: Secondary | ICD-10-CM | POA: Diagnosis not present

## 2016-06-19 DIAGNOSIS — F332 Major depressive disorder, recurrent severe without psychotic features: Secondary | ICD-10-CM | POA: Diagnosis not present

## 2016-06-19 DIAGNOSIS — F4310 Post-traumatic stress disorder, unspecified: Secondary | ICD-10-CM | POA: Diagnosis not present

## 2016-06-19 DIAGNOSIS — Z6827 Body mass index (BMI) 27.0-27.9, adult: Secondary | ICD-10-CM | POA: Diagnosis not present

## 2016-06-23 DIAGNOSIS — H2513 Age-related nuclear cataract, bilateral: Secondary | ICD-10-CM | POA: Diagnosis not present

## 2016-06-23 DIAGNOSIS — E113291 Type 2 diabetes mellitus with mild nonproliferative diabetic retinopathy without macular edema, right eye: Secondary | ICD-10-CM | POA: Diagnosis not present

## 2016-07-22 DIAGNOSIS — H31001 Unspecified chorioretinal scars, right eye: Secondary | ICD-10-CM | POA: Diagnosis not present

## 2016-07-22 DIAGNOSIS — H25013 Cortical age-related cataract, bilateral: Secondary | ICD-10-CM | POA: Diagnosis not present

## 2016-07-22 DIAGNOSIS — H2513 Age-related nuclear cataract, bilateral: Secondary | ICD-10-CM | POA: Diagnosis not present

## 2016-07-22 DIAGNOSIS — H1851 Endothelial corneal dystrophy: Secondary | ICD-10-CM | POA: Diagnosis not present

## 2016-08-07 DIAGNOSIS — E663 Overweight: Secondary | ICD-10-CM | POA: Diagnosis not present

## 2016-08-07 DIAGNOSIS — Z6827 Body mass index (BMI) 27.0-27.9, adult: Secondary | ICD-10-CM | POA: Diagnosis not present

## 2016-08-07 DIAGNOSIS — Z01818 Encounter for other preprocedural examination: Secondary | ICD-10-CM | POA: Diagnosis not present

## 2016-08-11 DIAGNOSIS — H2181 Floppy iris syndrome: Secondary | ICD-10-CM | POA: Diagnosis not present

## 2016-08-11 DIAGNOSIS — J45909 Unspecified asthma, uncomplicated: Secondary | ICD-10-CM | POA: Diagnosis not present

## 2016-08-11 DIAGNOSIS — I10 Essential (primary) hypertension: Secondary | ICD-10-CM | POA: Diagnosis not present

## 2016-08-11 DIAGNOSIS — E785 Hyperlipidemia, unspecified: Secondary | ICD-10-CM | POA: Diagnosis not present

## 2016-08-11 DIAGNOSIS — E669 Obesity, unspecified: Secondary | ICD-10-CM | POA: Diagnosis not present

## 2016-08-11 DIAGNOSIS — E119 Type 2 diabetes mellitus without complications: Secondary | ICD-10-CM | POA: Diagnosis not present

## 2016-08-11 DIAGNOSIS — H2511 Age-related nuclear cataract, right eye: Secondary | ICD-10-CM | POA: Diagnosis not present

## 2016-08-11 DIAGNOSIS — H1851 Endothelial corneal dystrophy: Secondary | ICD-10-CM | POA: Diagnosis not present

## 2016-08-25 DIAGNOSIS — Z79899 Other long term (current) drug therapy: Secondary | ICD-10-CM | POA: Diagnosis not present

## 2016-08-25 DIAGNOSIS — H2512 Age-related nuclear cataract, left eye: Secondary | ICD-10-CM | POA: Diagnosis not present

## 2016-08-25 DIAGNOSIS — H1851 Endothelial corneal dystrophy: Secondary | ICD-10-CM | POA: Diagnosis not present

## 2016-08-29 DIAGNOSIS — I259 Chronic ischemic heart disease, unspecified: Secondary | ICD-10-CM | POA: Diagnosis not present

## 2016-08-29 DIAGNOSIS — Z79899 Other long term (current) drug therapy: Secondary | ICD-10-CM | POA: Diagnosis not present

## 2016-08-29 DIAGNOSIS — Z6827 Body mass index (BMI) 27.0-27.9, adult: Secondary | ICD-10-CM | POA: Diagnosis not present

## 2016-08-29 DIAGNOSIS — L309 Dermatitis, unspecified: Secondary | ICD-10-CM | POA: Diagnosis not present

## 2016-08-29 DIAGNOSIS — K219 Gastro-esophageal reflux disease without esophagitis: Secondary | ICD-10-CM | POA: Diagnosis not present

## 2016-08-29 DIAGNOSIS — I10 Essential (primary) hypertension: Secondary | ICD-10-CM | POA: Diagnosis not present

## 2016-08-29 DIAGNOSIS — F418 Other specified anxiety disorders: Secondary | ICD-10-CM | POA: Diagnosis not present

## 2016-08-29 DIAGNOSIS — Z23 Encounter for immunization: Secondary | ICD-10-CM | POA: Diagnosis not present

## 2016-08-29 DIAGNOSIS — E7800 Pure hypercholesterolemia, unspecified: Secondary | ICD-10-CM | POA: Diagnosis not present

## 2016-08-29 DIAGNOSIS — E119 Type 2 diabetes mellitus without complications: Secondary | ICD-10-CM | POA: Diagnosis not present

## 2016-09-30 ENCOUNTER — Other Ambulatory Visit: Payer: Self-pay

## 2016-09-30 DIAGNOSIS — K219 Gastro-esophageal reflux disease without esophagitis: Secondary | ICD-10-CM | POA: Diagnosis not present

## 2016-09-30 DIAGNOSIS — G4700 Insomnia, unspecified: Secondary | ICD-10-CM | POA: Diagnosis not present

## 2016-09-30 DIAGNOSIS — F418 Other specified anxiety disorders: Secondary | ICD-10-CM | POA: Diagnosis not present

## 2016-09-30 DIAGNOSIS — Z6826 Body mass index (BMI) 26.0-26.9, adult: Secondary | ICD-10-CM | POA: Diagnosis not present

## 2016-09-30 MED ORDER — NITROGLYCERIN 0.4 MG SL SUBL: 0.4000 mg | SUBLINGUAL_TABLET | SUBLINGUAL | 3 refills | Status: DC | PRN

## 2016-09-30 MED ORDER — ISOSORBIDE MONONITRATE ER 60 MG PO TB24: 60.0000 mg | ORAL_TABLET | Freq: Every day | ORAL | 11 refills | Status: DC

## 2016-10-07 DIAGNOSIS — N393 Stress incontinence (female) (male): Secondary | ICD-10-CM | POA: Diagnosis not present

## 2016-10-07 DIAGNOSIS — Z6826 Body mass index (BMI) 26.0-26.9, adult: Secondary | ICD-10-CM | POA: Diagnosis not present

## 2016-10-07 DIAGNOSIS — N390 Urinary tract infection, site not specified: Secondary | ICD-10-CM | POA: Diagnosis not present

## 2016-10-07 DIAGNOSIS — N360 Urethral fistula: Secondary | ICD-10-CM | POA: Diagnosis not present

## 2016-10-08 DIAGNOSIS — N390 Urinary tract infection, site not specified: Secondary | ICD-10-CM | POA: Diagnosis not present

## 2016-11-13 DIAGNOSIS — R1310 Dysphagia, unspecified: Secondary | ICD-10-CM | POA: Diagnosis not present

## 2016-11-13 DIAGNOSIS — K210 Gastro-esophageal reflux disease with esophagitis: Secondary | ICD-10-CM | POA: Diagnosis not present

## 2016-11-19 DIAGNOSIS — K222 Esophageal obstruction: Secondary | ICD-10-CM | POA: Diagnosis not present

## 2016-11-19 DIAGNOSIS — R1310 Dysphagia, unspecified: Secondary | ICD-10-CM | POA: Diagnosis not present

## 2016-11-19 DIAGNOSIS — K449 Diaphragmatic hernia without obstruction or gangrene: Secondary | ICD-10-CM | POA: Diagnosis not present

## 2016-11-19 DIAGNOSIS — K219 Gastro-esophageal reflux disease without esophagitis: Secondary | ICD-10-CM | POA: Diagnosis not present

## 2016-12-18 DIAGNOSIS — Z87891 Personal history of nicotine dependence: Secondary | ICD-10-CM | POA: Diagnosis not present

## 2016-12-18 DIAGNOSIS — I10 Essential (primary) hypertension: Secondary | ICD-10-CM | POA: Diagnosis not present

## 2016-12-18 DIAGNOSIS — Z7982 Long term (current) use of aspirin: Secondary | ICD-10-CM | POA: Diagnosis not present

## 2016-12-18 DIAGNOSIS — R1310 Dysphagia, unspecified: Secondary | ICD-10-CM | POA: Diagnosis not present

## 2016-12-18 DIAGNOSIS — K219 Gastro-esophageal reflux disease without esophagitis: Secondary | ICD-10-CM | POA: Diagnosis not present

## 2016-12-18 DIAGNOSIS — Z79899 Other long term (current) drug therapy: Secondary | ICD-10-CM | POA: Diagnosis not present

## 2016-12-18 DIAGNOSIS — I739 Peripheral vascular disease, unspecified: Secondary | ICD-10-CM | POA: Diagnosis not present

## 2016-12-18 DIAGNOSIS — E785 Hyperlipidemia, unspecified: Secondary | ICD-10-CM | POA: Diagnosis not present

## 2016-12-18 DIAGNOSIS — K449 Diaphragmatic hernia without obstruction or gangrene: Secondary | ICD-10-CM | POA: Diagnosis not present

## 2016-12-18 DIAGNOSIS — K222 Esophageal obstruction: Secondary | ICD-10-CM | POA: Diagnosis not present

## 2016-12-18 DIAGNOSIS — F329 Major depressive disorder, single episode, unspecified: Secondary | ICD-10-CM | POA: Diagnosis not present

## 2016-12-18 DIAGNOSIS — Q393 Congenital stenosis and stricture of esophagus: Secondary | ICD-10-CM | POA: Diagnosis not present

## 2016-12-18 DIAGNOSIS — K210 Gastro-esophageal reflux disease with esophagitis: Secondary | ICD-10-CM | POA: Diagnosis not present

## 2016-12-18 DIAGNOSIS — E119 Type 2 diabetes mellitus without complications: Secondary | ICD-10-CM | POA: Diagnosis not present

## 2016-12-18 DIAGNOSIS — K2970 Gastritis, unspecified, without bleeding: Secondary | ICD-10-CM | POA: Diagnosis not present

## 2016-12-18 DIAGNOSIS — F419 Anxiety disorder, unspecified: Secondary | ICD-10-CM | POA: Diagnosis not present

## 2017-01-07 ENCOUNTER — Encounter: Payer: Self-pay | Admitting: Cardiology

## 2017-01-26 ENCOUNTER — Ambulatory Visit: Payer: Medicare Other | Admitting: Cardiology

## 2017-01-26 NOTE — Progress Notes (Deleted)
[address] [place], [place]  [address] Phone: [phone] Fax:  [phone]  Date:  01/26/2017   ID:  CANDID BOVEY, DOB Sep 20, 1951, MRN [id number]  PCP:  Lonie Peak, PA-C   History of Present Illness: Crystal [name] is a 66 y.o. female with coronary artery disease,bypass, 3 prior ostial RCA stents according to records patent by nonselective coronary angiography on 06/19/08. Prior bypass, RIMA to RCA was occluded on catheterization in 2/05. Small ASD, peripheral vascular disease with patch angioplasty to right femoral artery here for follow up.   Mild chest pain at times. Has not changed, very minor she states. Can happen at rest and movement. No change.   Has seven kids. 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  Discussed hyperlipidemia. Goal LDL is less than 70. Last check 81. Multiple drug regimen as above.   Since starting the Toprol, heart rate has decreased significantly. She is feeling better. Occasional atypical chest discomfort. Peripheral vascular lab preliminarily shows 3 vessel runoff bilaterally, mild stenosis of the right CFA, diffuse non-hemodynamically significant disease throughout the SFAs bilaterally. Dr. Arbie Cookey has seen in the past. 2015 lower extremity Doppler done     Wt Readings from Last 3 Encounters:  01/21/16 154 lb 12.8 oz (70.2 kg)  07/23/15 164 lb 1.9 oz (74.4 kg)  01/23/15 160 lb 9.6 oz (72.8 kg)     Past Medical History:  Diagnosis Date  . Angina pectoris (HCC)   . Anxiety   . ASCVD (arteriosclerotic cardiovascular disease)    single vessel  . ASD (atrial septal defect)    Small ASD with insignificant shunt  . Back pain, chronic   . Chronic ischemic heart disease   . Claudication (HCC)   . Coronary atherosclerosis of native coronary artery   . Depression   . Diabetes mellitus without complication (HCC)   . Edema   . GERD (gastroesophageal reflux  disease)   . Hypercholesteremia   . Hypertension   . MRSA carrier    Chronic  . Occlusion and stenosis of carotid artery without mention of cerebral infarction    Last carotid doppler was in 2006. Patch angioplasty right femoral artery 01/1998, patenet 09/2005 ([facility])  . Osteoarthritis   . Osteopenia   . Other and unspecified angina pectoris   . PAD (peripheral artery disease) (HCC)   . Postsurgical aortocoronary bypass status   . Postsurgical percutaneous transluminal coronary angioplasty status   . Pseudodementia   . Right carotid bruit    Negative doppler 09/2002  . SOB (shortness of breath)    ECHO 04/13/12 EF estimated at 55-60%    Past Surgical History:  Procedure Laterality Date  . CORONARY ANGIOPLASTY WITH STENT PLACEMENT  01/15/98   PTCA stent RCA, PTCA diagonal  . CORONARY ANGIOPLASTY WITH STENT PLACEMENT  08/17/00   Cutting balloon ostial RCA  . CORONARY ANGIOPLASTY WITH STENT PLACEMENT  01/19/01   Unsuccessful attempted PTCA ostial RCA, stent protruding into root  . CORONARY ANGIOPLASTY WITH STENT PLACEMENT  01/20/01   RIMA-RCA, occluded cath 10/2003  . CORONARY ANGIOPLASTY WITH STENT PLACEMENT  02/02/04   Successful PTCA/drug eluding stent ostial RCA, [facility]  . TUBAL LIGATION      Current Outpatient Prescriptions  Medication Sig Dispense Refill  . albuterol (PROVENTIL HFA;VENTOLIN HFA) 108 (90 BASE) MCG/ACT inhaler Inhale 2 puffs into the lungs every 6 (six) hours  as needed for wheezing.     Marland Kitchen aspirin 81 MG tablet Take 81 mg by mouth daily.    Marland Kitchen atorvastatin (LIPITOR) 80 MG tablet Take 80 mg by mouth daily.    Marland Kitchen buPROPion (WELLBUTRIN XL) 300 MG 24 hr tablet Take 300 mg by mouth daily.    . calcium-vitamin D (OSCAL WITH D) 500-200 MG-UNIT per tablet Take 1 tablet by mouth daily. 500 mg by mouth daily    . cholecalciferol (VITAMIN D) 400 UNITS TABS tablet Take 1,000 Units by mouth daily.    . cilostazol (PLETAL) 50 MG tablet Take 50 mg by mouth 2 (two) times daily.     . clonazePAM (KLONOPIN) 1 MG tablet Take 1 mg by mouth daily.    . DULoxetine (CYMBALTA) 60 MG capsule Take 1 capsule (60 mg total) by mouth daily.    Marland Kitchen esomeprazole (NEXIUM) 40 MG capsule Take 40 mg by mouth daily before breakfast.    . isosorbide mononitrate (IMDUR) 60 MG 24 hr tablet Take 1 tablet (60 mg total) by mouth daily. 30 tablet 11  . meclizine (ANTIVERT) 25 MG tablet Take 25 mg by mouth daily.    . metFORMIN (GLUCOPHAGE) 500 MG tablet Take 500 mg by mouth daily with breakfast.    . metoprolol succinate (TOPROL-XL) 50 MG 24 hr tablet Take 2 tablets (100 mg total) by mouth daily. 180 tablet 2  . nabumetone (RELAFEN) 500 MG tablet Take 1,000 mg by mouth 2 (two) times daily.    . nitroGLYCERIN (NITROSTAT) 0.4 MG SL tablet Place 1 tablet (0.4 mg total) under the tongue every 5 (five) minutes as needed for chest pain. x3 doses as needed for chest pain 25 tablet 3  . omeprazole (PRILOSEC) 40 MG capsule Take 40 mg by mouth daily.    . vitamin B-12 (CYANOCOBALAMIN) 1000 MCG tablet Take 2,000 mcg by mouth daily.     No current facility-administered medications for this visit.     Allergies:    Allergies  Allergen Reactions  . Bactrim [Sulfamethoxazole-Trimethoprim] Nausea And Vomiting  . Penicillins     Unknown   . Sulfa Antibiotics     unknown    Social History:  The patient  reports that she quit smoking about 6 years ago. Her smoking use included Cigarettes. She has never used smokeless tobacco. She reports that she does not drink alcohol or use drugs.   ROS:  Please see the history of present illness.    Denies any fevers, chills, orthopnea, PND  PHYSICAL EXAM: VS:  There were no vitals taken for this visit.  GEN: Well nourished, well developed, in no acute distress  HEENT: normal  Neck: no JVD, carotid bruits, or masses Cardiac: RRR; no murmurs, rubs, or gallops,no edema  Respiratory:  clear to auscultation bilaterally, normal work of breathing GI: soft, nontender,  nondistended, + BS MS: no deformity or atrophy , Decreased dorsalis pedis/posterior tibial pulses Skin: warm and dry, no rash Neuro:  Alert and Oriented x 3, Strength and sensation are intact Psych: euthymic mood, full affect  EKG:  Today, 01/23/15-normal sinus rhythm, 62, nonspecific ST-T wave changes, borderline prolonged QT. Prior - Sinus tachycardia rate 116, possible atrial tachycardia, old inferior infarct pattern.  Labs: 06/06/14 Triglycerides 179, HDL 38, LDL 81, hemoglobin A1c 6.2, creatinine 1.31, sodium 145, potassium 3.9, hemoglobin 13.4  11/04/2013: LE Doppler Mildly reduced flow, right ABI. Overall no need for further intervention or evaluation. Continue with exercise. Overall reassuring.   ASSESSMENT AND PLAN:  Coronary artery disease-status post bypass, prior RCA stents.   Tachycardia-previously sinus tachycardia but possible atrial tachycardia at 116 beats per minute. I increased her Toprol from 50-100 mg this seemed to help significantly. Heart rate currently is in the 60s.  Angina, currently stable. Isosorbide, Toprol. Atypical chest pain off and on. Multiple somatic complaints previously, overall doing well today.  ASD - small atrial septal defect corrected with patch during bypass.  Former smoker-good job with stopping tobacco.  Obesity-good job with weight loss, proximally 10 pounds. she states that she has not taken many calories. Her sons were athletes and she knows what is right to eat. She just cannot lose weight. Rice, cereals. Asked her to try to continue decrease carbohydrates.  PVD-currently on cilostazol/Pletal, ASA 81. She was on this prior to seeing me. I have continued this drug regimen. In 2015-Doppler showed Mild stenosis in the right common femoral artery, diffuse non-hemodynamically significant disease through superficial femoral arteries bilaterally. 3 vessel runoff bilaterally. Right ABI 0.86, left 0.92. Continue to encourage exercise. No bleeding.  Perhaps some of her numbness at times is more neuropathic. Overall, no significant change.  Carotid bruits bilaterally-I will check carotid ultrasound given her prior history of peripheral vascular disease  Hyperlipidemia-currently on atorvastatin. LDL 81.  12 month f/u  Signed, Donato Schultz, MD [facility]  01/26/2017 10:52 AM

## 2017-01-29 ENCOUNTER — Ambulatory Visit (INDEPENDENT_AMBULATORY_CARE_PROVIDER_SITE_OTHER): Payer: Medicare Other | Admitting: Cardiology

## 2017-01-29 ENCOUNTER — Encounter: Payer: Self-pay | Admitting: Cardiology

## 2017-01-29 VITALS — BP 120/74 | HR 57 | Ht 60.0 in | Wt 143.2 lb

## 2017-01-29 DIAGNOSIS — I2510 Atherosclerotic heart disease of native coronary artery without angina pectoris: Secondary | ICD-10-CM

## 2017-01-29 DIAGNOSIS — M79604 Pain in right leg: Secondary | ICD-10-CM | POA: Diagnosis not present

## 2017-01-29 DIAGNOSIS — I209 Angina pectoris, unspecified: Secondary | ICD-10-CM

## 2017-01-29 DIAGNOSIS — I739 Peripheral vascular disease, unspecified: Secondary | ICD-10-CM

## 2017-01-29 DIAGNOSIS — I2583 Coronary atherosclerosis due to lipid rich plaque: Secondary | ICD-10-CM

## 2017-01-29 DIAGNOSIS — I10 Essential (primary) hypertension: Secondary | ICD-10-CM

## 2017-01-29 NOTE — Progress Notes (Signed)
[address] [place], [place]  [address] Phone: [phone] Fax:  [phone]  Date:  01/29/2017   ID:  Crystal [name], DOB 1951-09-28, MRN [id number]  PCP:  Lonie Peak, PA-C   History of Present Illness: Crystal [name] is a 66 y.o. female with coronary artery disease with history of single-vessel CABG in 2002 RIMA to RCA, occluded on cardiac catheterization on 11/22/2003 here for follow-up.  Prior to CABG she had 3 prior ostial RCA stents according to records patent by nonselective coronary angiography on 06/19/08.   Small ASD, peripheral vascular disease with patch angioplasty to right femoral artery.  Has seven kids. 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 Doing great with that. She has some atypical chest discomfort off and on. Sometimes exertional sometimes at rest. She'll states that her right inner thigh sometimes is tender to the touch but can hurt at rest or with exertion. She is not complaining of calf pain. She has previously seen Dr. Arbie Cookey.    Wt Readings from Last 3 Encounters:  01/29/17 143 lb 3.2 oz (65 kg)  01/21/16 154 lb 12.8 oz (70.2 kg)  07/23/15 164 lb 1.9 oz (74.4 kg)     Past Medical History:  Diagnosis Date  . Angina pectoris (HCC)   . Anxiety   . ASCVD (arteriosclerotic cardiovascular disease)    single vessel  . ASD (atrial septal defect)    Small ASD with insignificant shunt  . Back pain, chronic   . Chronic ischemic heart disease   . Claudication (HCC)   . Coronary atherosclerosis of native coronary artery   . Depression   . Diabetes mellitus without complication (HCC)   . Edema   . GERD (gastroesophageal reflux disease)   . Hypercholesteremia   . Hypertension   . MRSA carrier    Chronic  . Occlusion and stenosis of carotid artery without mention of cerebral infarction    Last carotid doppler was in 2006. Patch angioplasty right femoral artery 01/1998, patenet 09/2005 ([facility])  .  Osteoarthritis   . Osteopenia   . Other and unspecified angina pectoris   . PAD (peripheral artery disease) (HCC)   . Postsurgical aortocoronary bypass status   . Postsurgical percutaneous transluminal coronary angioplasty status   . Pseudodementia   . Right carotid bruit    Negative doppler 09/2002  . SOB (shortness of breath)    ECHO 04/13/12 EF estimated at 55-60%    Past Surgical History:  Procedure Laterality Date  . CORONARY ANGIOPLASTY WITH STENT PLACEMENT  01/15/98   PTCA stent RCA, PTCA diagonal  . CORONARY ANGIOPLASTY WITH STENT PLACEMENT  08/17/00   Cutting balloon ostial RCA  . CORONARY ANGIOPLASTY WITH STENT PLACEMENT  01/19/01   Unsuccessful attempted PTCA ostial RCA, stent protruding into root  . CORONARY ANGIOPLASTY WITH STENT PLACEMENT  01/20/01   RIMA-RCA, occluded cath 10/2003  . CORONARY ANGIOPLASTY WITH STENT PLACEMENT  02/02/04   Successful PTCA/drug eluding stent ostial RCA, [facility]  . TUBAL LIGATION      Current Outpatient Prescriptions  Medication Sig Dispense Refill  . albuterol (PROVENTIL HFA;VENTOLIN HFA) 108 (90 BASE) MCG/ACT inhaler Inhale 2 puffs into the lungs every 6 (six) hours as needed for wheezing.     Marland Kitchen aspirin 81 MG tablet Take 81 mg by mouth daily.    Marland Kitchen atorvastatin (LIPITOR) 80 MG tablet Take 80 mg by mouth daily.    Marland Kitchen  buPROPion (WELLBUTRIN XL) 300 MG 24 hr tablet Take 300 mg by mouth daily.    . calcium-vitamin D (OSCAL WITH D) 500-200 MG-UNIT per tablet Take 1 tablet by mouth daily. 500 mg by mouth daily    . cholecalciferol (VITAMIN D) 400 UNITS TABS tablet Take 1,000 Units by mouth daily.    . cilostazol (PLETAL) 50 MG tablet Take 50 mg by mouth 2 (two) times daily.    . clonazePAM (KLONOPIN) 1 MG tablet Take 1 mg by mouth daily.    . DULoxetine (CYMBALTA) 60 MG capsule Take 1 capsule (60 mg total) by mouth daily.    Marland Kitchen. esomeprazole (NEXIUM) 40 MG capsule Take 40 mg by mouth daily before breakfast.    . isosorbide mononitrate  (IMDUR) 60 MG 24 hr tablet Take 1 tablet (60 mg total) by mouth daily. 30 tablet 11  . meclizine (ANTIVERT) 25 MG tablet Take 25 mg by mouth daily.    . metFORMIN (GLUCOPHAGE) 500 MG tablet Take 500 mg by mouth daily with breakfast.    . metoprolol succinate (TOPROL-XL) 50 MG 24 hr tablet Take 2 tablets (100 mg total) by mouth daily. 180 tablet 2  . nabumetone (RELAFEN) 500 MG tablet Take 1,000 mg by mouth 2 (two) times daily.    . nitroGLYCERIN (NITROSTAT) 0.4 MG SL tablet Place 1 tablet (0.4 mg total) under the tongue every 5 (five) minutes as needed for chest pain. x3 doses as needed for chest pain 25 tablet 3  . omeprazole (PRILOSEC) 40 MG capsule Take 40 mg by mouth daily.    . vitamin B-12 (CYANOCOBALAMIN) 1000 MCG tablet Take 2,000 mcg by mouth daily.     No current facility-administered medications for this visit.     Allergies:    Allergies  Allergen Reactions  . Bactrim [Sulfamethoxazole-Trimethoprim] Nausea And Vomiting  . Penicillins     Unknown   . Sulfa Antibiotics     unknown    Social History:  The patient  reports that she quit smoking about 6 years ago. Her smoking use included Cigarettes. She has never used smokeless tobacco. She reports that she does not drink alcohol or use drugs.   ROS:  Please see the history of present illness.    Unless specified above all other review of systems negative.  PHYSICAL EXAM: VS:  BP 120/74   Pulse (!) 57   Ht 5' (1.524 m)   Wt 143 lb 3.2 oz (65 kg)   BMI 27.97 kg/m   GEN: Well nourished, well developed, in no acute distress  HEENT: normal  Neck: no JVD, carotid bruits, or masses Cardiac: RRR; no murmurs, rubs, or gallops,no edema , Prior central chest scar noted. Respiratory:  clear to auscultation bilaterally, normal work of breathing GI: soft, nontender, nondistended, + BS MS: no deformity or atrophy  Skin: warm and dry, no rash, Palpable dorsalis pedis right Neuro:  Alert and Oriented x 3, Strength and sensation are  intact Psych: euthymic mood, full affect   EKG:  Today, 01/29/17-sinus bradycardia rate 57 with no other abnormalities personally viewed-prior 01/23/15-normal sinus rhythm, 62, nonspecific ST-T wave changes, borderline prolonged QT. Prior - Sinus tachycardia rate 116, possible atrial tachycardia, old inferior infarct pattern.  Labs: 06/06/14 Triglycerides 179, HDL 38, LDL 81, hemoglobin A1c 6.2, creatinine 1.31, sodium 145, potassium 3.9, hemoglobin 13.4   Cath 11/22/2003:  FINAL IMPRESSION:  1. Atherosclerotic coronary vascular disease, single-vessel.  2. Occluded right internal mammary artery to the right coronary.  3.  Degree of stenosis within the stented segment of the right coronary is     uncertain at this point.  4. Intact left ventricular size and global systolic function. 5. No significant left coronary artery disease.  11/04/2013: LE Doppler Mildly reduced flow, right ABI. Overall no need for further intervention or evaluation. Continue with exercise. Overall reassuring.  01/25/16: Carotid Doppler Heterogeneous plaque, bilaterally. 1-39% bilateral ICA stenosis. Elevated velocities in the right subclavian artery. Normal left subclavian artery. Patent vertebral arteries with antegrade flow. Mild carotid plaque noted. Continue with current therapy.  ASSESSMENT AND PLAN:  Coronary artery disease-status post bypass-single vessel which is occluded-RIMA to RCA, prior RCA stents. Dr. Amil Amen prior cardiac catheterization reviewed.  Angina, currently stable. Isosorbide, Toprol. Atypical chest pain off and on. Multiple somatic complaints previously, overall doing well today. Stable. No changes.  ASD - small atrial septal defect corrected with patch during bypass. Stable.  Former smoker-good job with stopping tobacco. No changes made.  Prior Obesity-good job with weight loss.  PVD-currently on cilostazol/Pletal, ASA 81. She was on this prior to seeing me. I have continued this drug  regimen. In 2015-Doppler showed Mild stenosis in the right common femoral artery, diffuse non-hemodynamically significant disease through superficial femoral arteries bilaterally. 3 vessel runoff bilaterally. Right ABI 0.86, left 0.92. Continue to encourage exercise. Given her right inner thigh discomfort, I will refer her back to Dr. Arbie Cookey.  No bleeding. Perhaps some of her numbness at times is more neuropathic. Overall, no significant change. I do feel like I am able to palpate her Dorsalis pedis.  Carotid bruits bilaterally-stable mild carotid plaque  Hyperlipidemia-currently on atorvastatin. Continue for plaque stability, no myalgias..  12 month f/u  Signed, Donato Schultz, MD [facility]  01/29/2017 10:34 AM

## 2017-01-29 NOTE — Patient Instructions (Signed)
Medication Instructions:  The current medical regimen is effective;  continue present plan and medications.  You have been referred to Dr Early for the evaluation of right thigh pain/discomfort.  Follow-Up: Follow up in 1 year with Dr. Anne FuSkains.  You will receive a letter in the mail 2 months before you are due.  Please call us when you receive this letter to schedule your follow up appointment.   If you need a refill on your cardiac medications before your next appointment, please call your pharmacy.  Thank you for choosing [facility]!!

## 2017-02-03 ENCOUNTER — Other Ambulatory Visit: Payer: Self-pay | Admitting: *Deleted

## 2017-02-03 DIAGNOSIS — M79651 Pain in right thigh: Secondary | ICD-10-CM

## 2017-03-30 ENCOUNTER — Encounter: Payer: Self-pay | Admitting: Vascular Surgery

## 2017-04-07 ENCOUNTER — Encounter: Payer: Medicare Other | Admitting: Vascular Surgery

## 2017-04-07 ENCOUNTER — Inpatient Hospital Stay (HOSPITAL_COMMUNITY): Admission: RE | Admit: 2017-04-07 | Payer: Medicare Other | Source: Ambulatory Visit

## 2017-04-20 ENCOUNTER — Other Ambulatory Visit: Payer: Self-pay

## 2017-04-20 DIAGNOSIS — F418 Other specified anxiety disorders: Secondary | ICD-10-CM | POA: Diagnosis not present

## 2017-04-20 DIAGNOSIS — I10 Essential (primary) hypertension: Secondary | ICD-10-CM | POA: Diagnosis not present

## 2017-04-20 DIAGNOSIS — E119 Type 2 diabetes mellitus without complications: Secondary | ICD-10-CM | POA: Diagnosis not present

## 2017-04-20 DIAGNOSIS — L309 Dermatitis, unspecified: Secondary | ICD-10-CM | POA: Diagnosis not present

## 2017-04-20 DIAGNOSIS — M545 Low back pain: Secondary | ICD-10-CM | POA: Diagnosis not present

## 2017-04-20 DIAGNOSIS — I259 Chronic ischemic heart disease, unspecified: Secondary | ICD-10-CM | POA: Diagnosis not present

## 2017-04-20 DIAGNOSIS — E7800 Pure hypercholesterolemia, unspecified: Secondary | ICD-10-CM | POA: Diagnosis not present

## 2017-04-20 DIAGNOSIS — Z79899 Other long term (current) drug therapy: Secondary | ICD-10-CM | POA: Diagnosis not present

## 2017-04-20 DIAGNOSIS — Z9181 History of falling: Secondary | ICD-10-CM | POA: Diagnosis not present

## 2017-04-20 DIAGNOSIS — I739 Peripheral vascular disease, unspecified: Secondary | ICD-10-CM | POA: Diagnosis not present

## 2017-04-20 MED ORDER — METOPROLOL SUCCINATE ER 50 MG PO TB24: 100.0000 mg | ORAL_TABLET | Freq: Every day | ORAL | 1 refills | Status: DC

## 2017-06-16 ENCOUNTER — Encounter: Payer: Self-pay | Admitting: Vascular Surgery

## 2017-06-16 ENCOUNTER — Ambulatory Visit (INDEPENDENT_AMBULATORY_CARE_PROVIDER_SITE_OTHER): Payer: Medicare Other | Admitting: Vascular Surgery

## 2017-06-16 ENCOUNTER — Ambulatory Visit (HOSPITAL_COMMUNITY)
Admission: RE | Admit: 2017-06-16 | Discharge: 2017-06-16 | Disposition: A | Discharge status: Home / Self Care | Payer: Medicare Other | Source: Ambulatory Visit | Attending: Vascular Surgery | Admitting: Vascular Surgery

## 2017-06-16 VITALS — BP 149/68 | HR 57 | Temp 97.6°F | Resp 18 | Ht 60.0 in | Wt 155.0 lb

## 2017-06-16 DIAGNOSIS — M79651 Pain in right thigh: Secondary | ICD-10-CM | POA: Diagnosis not present

## 2017-06-16 DIAGNOSIS — I209 Angina pectoris, unspecified: Secondary | ICD-10-CM

## 2017-06-16 DIAGNOSIS — M79604 Pain in right leg: Secondary | ICD-10-CM

## 2017-06-16 NOTE — Progress Notes (Signed)
Vascular and Vein Specialist of [facility]  Patient name: Crystal [name] MRN: [id number] DOB: 27-May-1951 Sex: female  REASON FOR CONSULT: Valuation pain in her right leg  HPI: Crystal [name] is a 66 y.o. female, who is seen today for evaluation pain in her right leg. She reports that I did some type of intervention on her 18 years ago that she has been was time. This apparently wasn't iliac artery intervention. She reports that she is having pain in her entire right leg. This begins in her medial thigh and extends throughout her leg and into her calf and foot. She reports that this can occur with walking or rest and also can occur with her lying sitting or standing. She does have a progressively severe lower back pain and this makes it difficult for even prior to step up onto the exam table. He does not have any history of tissue loss and no true claudication type symptoms.  Past Medical History:  Diagnosis Date  . Angina pectoris (HCC)   . Anxiety   . ASCVD (arteriosclerotic cardiovascular disease)    single vessel  . ASD (atrial septal defect)    Small ASD with insignificant shunt  . Back pain, chronic   . Chronic ischemic heart disease   . Claudication (HCC)   . Coronary atherosclerosis of native coronary artery   . Depression   . Diabetes mellitus without complication (HCC)   . Edema   . GERD (gastroesophageal reflux disease)   . Hypercholesteremia   . Hypertension   . MRSA carrier    Chronic  . Occlusion and stenosis of carotid artery without mention of cerebral infarction    Last carotid doppler was in 2006. Patch angioplasty right femoral artery 01/1998, patenet 09/2005 ([facility])  . Osteoarthritis   . Osteopenia   . Other and unspecified angina pectoris   . PAD (peripheral artery disease) (HCC)   . Postsurgical aortocoronary bypass status   . Postsurgical percutaneous transluminal coronary angioplasty status   . Pseudodementia   . Right carotid  bruit    Negative doppler 09/2002  . SOB (shortness of breath)    ECHO 04/13/12 EF estimated at 55-60%    Family History  Problem Relation Age of Onset  . Heart attack Mother     SOCIAL HISTORY: Social History   Social History  . Marital status: Divorced    Spouse name: N/A  . Number of children: N/A  . Years of education: N/A   Occupational History  . Not on file.   Social History Main Topics  . Smoking status: Former Smoker    Types: Cigarettes    Quit date: 09/29/2010  . Smokeless tobacco: Never Used  . Alcohol use No  . Drug use: No  . Sexual activity: Not on file   Other Topics Concern  . Not on file   Social History Narrative  . No narrative on file    Allergies  Allergen Reactions  . Bactrim [Sulfamethoxazole-Trimethoprim] Nausea And Vomiting  . Penicillins     Unknown   . Sulfa Antibiotics     unknown    Current Outpatient Prescriptions  Medication Sig Dispense Refill  . albuterol (PROVENTIL HFA;VENTOLIN HFA) 108 (90 BASE) MCG/ACT inhaler Inhale 2 puffs into the lungs every 6 (six) hours as needed for wheezing.     Marland Kitchen aspirin 81 MG tablet Take 81 mg by mouth daily.    Marland Kitchen atorvastatin (LIPITOR) 80 MG tablet Take 80 mg by mouth daily.    Marland Kitchen  buPROPion (WELLBUTRIN XL) 300 MG 24 hr tablet Take 300 mg by mouth daily.    . calcium-vitamin D (OSCAL WITH D) 500-200 MG-UNIT per tablet Take 1 tablet by mouth daily. 500 mg by mouth daily    . cholecalciferol (VITAMIN D) 400 UNITS TABS tablet Take 1,000 Units by mouth daily.    . cilostazol (PLETAL) 50 MG tablet Take 50 mg by mouth 2 (two) times daily.    . clonazePAM (KLONOPIN) 1 MG tablet Take 1 mg by mouth daily.    . DULoxetine (CYMBALTA) 60 MG capsule Take 1 capsule (60 mg total) by mouth daily.    Marland Kitchen esomeprazole (NEXIUM) 40 MG capsule Take 40 mg by mouth daily before breakfast.    . isosorbide mononitrate (IMDUR) 60 MG 24 hr tablet Take 1 tablet (60 mg total) by mouth daily. 30 tablet 11  . meclizine (ANTIVERT)  25 MG tablet Take 25 mg by mouth daily.    . metFORMIN (GLUCOPHAGE) 500 MG tablet Take 500 mg by mouth daily with breakfast.    . metoprolol succinate (TOPROL-XL) 50 MG 24 hr tablet Take 2 tablets (100 mg total) by mouth daily. 180 tablet 1  . nabumetone (RELAFEN) 500 MG tablet Take 1,000 mg by mouth 2 (two) times daily.    . nitroGLYCERIN (NITROSTAT) 0.4 MG SL tablet Place 1 tablet (0.4 mg total) under the tongue every 5 (five) minutes as needed for chest pain. x3 doses as needed for chest pain 25 tablet 3  . omeprazole (PRILOSEC) 40 MG capsule Take 40 mg by mouth daily.    . vitamin B-12 (CYANOCOBALAMIN) 1000 MCG tablet Take 2,000 mcg by mouth daily.     No current facility-administered medications for this visit.     REVIEW OF SYSTEMS:   denotes positive finding,  denotes negative finding Cardiac  Comments:  Chest pain or chest pressure: x   Shortness of breath upon exertion: x   Short of breath when lying flat:    Irregular heart rhythm: x       Vascular    Pain in calf, thigh, or hip brought on by ambulation: x   Pain in feet at night that wakes you up from your sleep:     Blood clot in your veins:    Leg swelling:         Pulmonary    Oxygen at home:    Productive cough:     Wheezing:  x       Neurologic    Sudden weakness in arms or legs:  x   Sudden numbness in arms or legs:     Sudden onset of difficulty speaking or slurred speech:    Temporary loss of vision in one eye:     Problems with dizziness:         Gastrointestinal    Blood in stool:     Vomited blood:         Genitourinary    Burning when urinating:     Blood in urine:        Psychiatric    Major depression:         Hematologic    Bleeding problems:    Problems with blood clotting too easily:        Skin    Rashes or ulcers: x       Constitutional    Fever or chills:      PHYSICAL EXAM: Vitals:   06/16/17 1109  BP: (!) 149/68  Pulse: (!) 57  Resp: 18  Temp: 97.6 F (36.4 C)    SpO2: 96%  Weight: 155 lb (70.3 kg)  Height: 5' (1.524 m)    GENERAL: The patient is a well-nourished female, in no acute distress. The vital signs are documented above. CARDIOVASCULAR: Carotids without bruits bilaterally. 2+ radial 2+ femoral and 2+ dorsalis pedis pulses bilaterally PULMONARY: There is good air exchange  ABDOMEN: Soft and non-tender  MUSCULOSKELETAL: There are no major deformities or cyanosis. NEUROLOGIC: No focal weakness or paresthesias are detected. SKIN: There are no ulcers or rashes noted. PSYCHIATRIC: The patient has a normal affect.  DATA:  Lower extremity arterial studies today revealed normal ankle arm index bilaterally and normal triphasic posterior tibial signals bilaterally  MEDICAL ISSUES: I discussed this at length with the patient. She does not have any evidence of arterial insufficiency and her symptoms which occur with exercise and at rest are not consistent with claudication. I suspect this may well be related to her severe back pain and degenerative disc disease. She was reassured this is not dangerous regarding her lower from any flow which is normal. She will see [place] again on an as-needed basis   Larina Earthly, MD [facility] Vascular and Vein Specialists of [facility] Tel [phone] Pager [phone]

## 2017-06-26 DIAGNOSIS — Z6828 Body mass index (BMI) 28.0-28.9, adult: Secondary | ICD-10-CM | POA: Diagnosis not present

## 2017-06-26 DIAGNOSIS — M109 Gout, unspecified: Secondary | ICD-10-CM | POA: Diagnosis not present

## 2017-07-17 DIAGNOSIS — M109 Gout, unspecified: Secondary | ICD-10-CM | POA: Diagnosis not present

## 2017-07-17 DIAGNOSIS — Z6829 Body mass index (BMI) 29.0-29.9, adult: Secondary | ICD-10-CM | POA: Diagnosis not present

## 2017-08-04 ENCOUNTER — Other Ambulatory Visit: Payer: Self-pay

## 2017-08-04 MED ORDER — NITROGLYCERIN 0.4 MG SL SUBL: 0.4000 mg | SUBLINGUAL_TABLET | SUBLINGUAL | 3 refills | Status: DC | PRN

## 2017-08-19 ENCOUNTER — Other Ambulatory Visit: Payer: Self-pay | Admitting: Cardiology

## 2017-10-20 ENCOUNTER — Encounter: Payer: Self-pay | Admitting: Cardiology

## 2017-10-20 DIAGNOSIS — R42 Dizziness and giddiness: Secondary | ICD-10-CM | POA: Diagnosis not present

## 2017-10-20 DIAGNOSIS — E119 Type 2 diabetes mellitus without complications: Secondary | ICD-10-CM | POA: Diagnosis not present

## 2017-10-20 DIAGNOSIS — Z1331 Encounter for screening for depression: Secondary | ICD-10-CM | POA: Diagnosis not present

## 2017-10-20 DIAGNOSIS — S0990XA Unspecified injury of head, initial encounter: Secondary | ICD-10-CM | POA: Diagnosis not present

## 2017-10-20 DIAGNOSIS — Z79899 Other long term (current) drug therapy: Secondary | ICD-10-CM | POA: Diagnosis not present

## 2017-10-20 DIAGNOSIS — E7800 Pure hypercholesterolemia, unspecified: Secondary | ICD-10-CM | POA: Diagnosis not present

## 2017-10-20 DIAGNOSIS — I739 Peripheral vascular disease, unspecified: Secondary | ICD-10-CM | POA: Diagnosis not present

## 2017-10-20 DIAGNOSIS — I259 Chronic ischemic heart disease, unspecified: Secondary | ICD-10-CM | POA: Diagnosis not present

## 2017-10-20 DIAGNOSIS — M109 Gout, unspecified: Secondary | ICD-10-CM | POA: Diagnosis not present

## 2017-11-24 ENCOUNTER — Emergency Department (HOSPITAL_COMMUNITY): Payer: Medicare Other

## 2017-11-24 ENCOUNTER — Observation Stay (HOSPITAL_COMMUNITY)
Admission: EM | Admit: 2017-11-24 | Discharge: 2017-11-26 | Disposition: A | Discharge status: Home / Self Care | Payer: Medicare Other | Attending: Cardiology | Admitting: Cardiology

## 2017-11-24 ENCOUNTER — Other Ambulatory Visit: Payer: Self-pay

## 2017-11-24 ENCOUNTER — Encounter (HOSPITAL_COMMUNITY): Payer: Self-pay | Admitting: Emergency Medicine

## 2017-11-24 DIAGNOSIS — I082 Rheumatic disorders of both aortic and tricuspid valves: Secondary | ICD-10-CM | POA: Diagnosis not present

## 2017-11-24 DIAGNOSIS — F419 Anxiety disorder, unspecified: Secondary | ICD-10-CM | POA: Diagnosis not present

## 2017-11-24 DIAGNOSIS — I5030 Unspecified diastolic (congestive) heart failure: Secondary | ICD-10-CM | POA: Insufficient documentation

## 2017-11-24 DIAGNOSIS — Z951 Presence of aortocoronary bypass graft: Secondary | ICD-10-CM | POA: Insufficient documentation

## 2017-11-24 DIAGNOSIS — Z8673 Personal history of transient ischemic attack (TIA), and cerebral infarction without residual deficits: Secondary | ICD-10-CM | POA: Insufficient documentation

## 2017-11-24 DIAGNOSIS — Z7982 Long term (current) use of aspirin: Secondary | ICD-10-CM | POA: Insufficient documentation

## 2017-11-24 DIAGNOSIS — Z8249 Family history of ischemic heart disease and other diseases of the circulatory system: Secondary | ICD-10-CM | POA: Insufficient documentation

## 2017-11-24 DIAGNOSIS — E785 Hyperlipidemia, unspecified: Secondary | ICD-10-CM | POA: Insufficient documentation

## 2017-11-24 DIAGNOSIS — E7800 Pure hypercholesterolemia, unspecified: Secondary | ICD-10-CM | POA: Insufficient documentation

## 2017-11-24 DIAGNOSIS — Z880 Allergy status to penicillin: Secondary | ICD-10-CM | POA: Insufficient documentation

## 2017-11-24 DIAGNOSIS — M8580 Other specified disorders of bone density and structure, unspecified site: Secondary | ICD-10-CM | POA: Diagnosis not present

## 2017-11-24 DIAGNOSIS — I471 Supraventricular tachycardia: Secondary | ICD-10-CM | POA: Diagnosis not present

## 2017-11-24 DIAGNOSIS — N179 Acute kidney failure, unspecified: Secondary | ICD-10-CM | POA: Insufficient documentation

## 2017-11-24 DIAGNOSIS — M1990 Unspecified osteoarthritis, unspecified site: Secondary | ICD-10-CM | POA: Diagnosis not present

## 2017-11-24 DIAGNOSIS — I110 Hypertensive heart disease with heart failure: Secondary | ICD-10-CM | POA: Diagnosis not present

## 2017-11-24 DIAGNOSIS — I259 Chronic ischemic heart disease, unspecified: Secondary | ICD-10-CM | POA: Diagnosis not present

## 2017-11-24 DIAGNOSIS — I700 Atherosclerosis of aorta: Secondary | ICD-10-CM | POA: Diagnosis not present

## 2017-11-24 DIAGNOSIS — Z8614 Personal history of Methicillin resistant Staphylococcus aureus infection: Secondary | ICD-10-CM | POA: Insufficient documentation

## 2017-11-24 DIAGNOSIS — R052 Subacute cough: Secondary | ICD-10-CM

## 2017-11-24 DIAGNOSIS — Z7984 Long term (current) use of oral hypoglycemic drugs: Secondary | ICD-10-CM | POA: Insufficient documentation

## 2017-11-24 DIAGNOSIS — F329 Major depressive disorder, single episode, unspecified: Secondary | ICD-10-CM | POA: Insufficient documentation

## 2017-11-24 DIAGNOSIS — Q211 Atrial septal defect: Secondary | ICD-10-CM | POA: Insufficient documentation

## 2017-11-24 DIAGNOSIS — Z955 Presence of coronary angioplasty implant and graft: Secondary | ICD-10-CM | POA: Insufficient documentation

## 2017-11-24 DIAGNOSIS — J449 Chronic obstructive pulmonary disease, unspecified: Secondary | ICD-10-CM

## 2017-11-24 DIAGNOSIS — Z882 Allergy status to sulfonamides status: Secondary | ICD-10-CM | POA: Insufficient documentation

## 2017-11-24 DIAGNOSIS — I498 Other specified cardiac arrhythmias: Secondary | ICD-10-CM

## 2017-11-24 DIAGNOSIS — I6523 Occlusion and stenosis of bilateral carotid arteries: Secondary | ICD-10-CM | POA: Diagnosis not present

## 2017-11-24 DIAGNOSIS — K219 Gastro-esophageal reflux disease without esophagitis: Secondary | ICD-10-CM | POA: Diagnosis not present

## 2017-11-24 DIAGNOSIS — R490 Dysphonia: Secondary | ICD-10-CM | POA: Insufficient documentation

## 2017-11-24 DIAGNOSIS — E1151 Type 2 diabetes mellitus with diabetic peripheral angiopathy without gangrene: Secondary | ICD-10-CM | POA: Diagnosis not present

## 2017-11-24 DIAGNOSIS — R05 Cough: Secondary | ICD-10-CM

## 2017-11-24 DIAGNOSIS — G8929 Other chronic pain: Secondary | ICD-10-CM | POA: Insufficient documentation

## 2017-11-24 DIAGNOSIS — I2510 Atherosclerotic heart disease of native coronary artery without angina pectoris: Secondary | ICD-10-CM | POA: Diagnosis not present

## 2017-11-24 DIAGNOSIS — R0982 Postnasal drip: Secondary | ICD-10-CM | POA: Insufficient documentation

## 2017-11-24 DIAGNOSIS — Z87891 Personal history of nicotine dependence: Secondary | ICD-10-CM | POA: Insufficient documentation

## 2017-11-24 DIAGNOSIS — Z79899 Other long term (current) drug therapy: Secondary | ICD-10-CM | POA: Insufficient documentation

## 2017-11-24 LAB — BASIC METABOLIC PANEL
Anion gap: 12 (ref 5–15)
BUN: 11 mg/dL (ref 6–20)
CO2: 21 mmol/L — ABNORMAL LOW (ref 22–32)
Calcium: 9 mg/dL (ref 8.9–10.3)
Chloride: 109 mmol/L (ref 101–111)
Creatinine, Ser: 1.44 mg/dL — ABNORMAL HIGH (ref 0.44–1.00)
GFR calc Af Amer: 42 mL/min — ABNORMAL LOW (ref >60)
GFR calc non Af Amer: 37 mL/min — ABNORMAL LOW (ref >60)
Glucose, Bld: 112 mg/dL — ABNORMAL HIGH (ref 65–99)
Potassium: 4.1 mmol/L (ref 3.5–5.1)
Sodium: 142 mmol/L (ref 135–145)

## 2017-11-24 LAB — CBC
HCT: 38.5 % (ref 36.0–46.0)
Hemoglobin: 13.4 g/dL (ref 12.0–15.0)
MCH: 34.5 pg — ABNORMAL HIGH (ref 26.0–34.0)
MCHC: 34.8 g/dL (ref 30.0–36.0)
MCV: 99.2 fL (ref 78.0–100.0)
Platelets: 285 10*3/uL (ref 150–400)
RBC: 3.88 MIL/uL (ref 3.87–5.11)
RDW: 15.2 % (ref 11.5–15.5)
WBC: 10.3 10*3/uL (ref 4.0–10.5)

## 2017-11-24 LAB — I-STAT TROPONIN, ED: Troponin i, poc: 0.01 ng/mL (ref 0.00–0.08)

## 2017-11-24 LAB — I-STAT CG4 LACTIC ACID, ED: Lactic Acid, Venous: 1.33 mmol/L (ref 0.5–1.9)

## 2017-11-24 MED ORDER — IOPAMIDOL (ISOVUE-370) INJECTION 76%
INTRAVENOUS | Status: AC
  Administered 2017-11-24: 100 mL
  Filled 2017-11-24: qty 100

## 2017-11-24 MED ORDER — SODIUM CHLORIDE 0.9 % IV BOLUS (SEPSIS)
1000.0000 mL | Freq: Once | INTRAVENOUS | Status: AC
Start: 2017-11-24 — End: 2017-11-24
  Administered 2017-11-24: 1000 mL via INTRAVENOUS

## 2017-11-24 MED ORDER — HYDROCOD POLST-CPM POLST ER 10-8 MG/5ML PO SUER
5.0000 mL | Freq: Once | ORAL | Status: AC
  Administered 2017-11-24: 5 mL via ORAL
  Filled 2017-11-24: qty 5

## 2017-11-24 MED ORDER — DILTIAZEM HCL-DEXTROSE 100-5 MG/100ML-% IV SOLN (PREMIX)
5.0000 mg/h | INTRAVENOUS | Status: DC
  Filled 2017-11-24: qty 100

## 2017-11-24 MED ORDER — SODIUM CHLORIDE 0.9 % IV BOLUS (SEPSIS)
1000.0000 mL | Freq: Once | INTRAVENOUS | Status: AC
  Administered 2017-11-24: 1000 mL via INTRAVENOUS

## 2017-11-24 MED ORDER — METOPROLOL TARTRATE 5 MG/5ML IV SOLN
5.0000 mg | Freq: Once | INTRAVENOUS | Status: AC
  Administered 2017-11-24: 5 mg via INTRAVENOUS
  Filled 2017-11-24: qty 5

## 2017-11-24 MED ORDER — DILTIAZEM LOAD VIA INFUSION
20.0000 mg | Freq: Once | INTRAVENOUS | Status: DC
  Filled 2017-11-24: qty 20

## 2017-11-24 NOTE — ED Provider Notes (Signed)
[facility]   CSN: [id number] Arrival date & time: 11/24/17  1634     History   Chief Complaint Chief Complaint  Patient presents with  . Shortness of Breath  . Tachycardia    HPI Crystal [name] is a 67 y.o. female.  67 yo F with a chief complaint of shortness of breath.  This been going on for the past couple weeks.  She has had a persistent cough as well.  She feels that her cough is not getting any better.  She has been taking cough medicine but eventually gave up because it did not help.  She has been eating and drinking a little less.  She denies fevers.  Denies muscle aches.  Denies abdominal pain nausea vomiting or diarrhea.   The history is provided by the patient.  Shortness of Breath  This is a new problem. The average episode lasts 2 weeks. The problem occurs continuously.The current episode started less than 1 hour ago. The problem has not changed since onset.Associated symptoms include cough. Pertinent negatives include no fever, no headaches, no rhinorrhea, no wheezing, no chest pain and no vomiting. She has tried nothing for the symptoms. She has had prior hospitalizations. She has had prior ED visits. Associated medical issues include asthma.    Past Medical History:  Diagnosis Date  . Angina pectoris (HCC)   . Anxiety   . ASCVD (arteriosclerotic cardiovascular disease)    single vessel  . ASD (atrial septal defect)    Small ASD with insignificant shunt  . Back pain, chronic   . Chronic ischemic heart disease   . Claudication (HCC)   . Coronary atherosclerosis of native coronary artery   . Depression   . Diabetes mellitus without complication (HCC)   . Edema   . GERD (gastroesophageal reflux disease)   . Hypercholesteremia   . Hypertension   . MRSA carrier    Chronic  . Occlusion and stenosis of carotid artery without mention of cerebral infarction    Last carotid doppler was in 2006. Patch  angioplasty right femoral artery 01/1998, patenet 09/2005 ([facility])  . Osteoarthritis   . Osteopenia   . Other and unspecified angina pectoris   . PAD (peripheral artery disease) (HCC)   . Postsurgical aortocoronary bypass status   . Postsurgical percutaneous transluminal coronary angioplasty status   . Pseudodementia   . Right carotid bruit    Negative doppler 09/2002  . SOB (shortness of breath)    ECHO 04/13/12 EF estimated at 55-60%    Patient Active Problem List   Diagnosis Date Noted  . Obesity 07/24/2014  . Hypothermia 12/09/2012  . Acute encephalopathy 12/09/2012  . CAD (coronary artery disease) 12/09/2012  . HTN (hypertension), benign 12/09/2012  . PVD (peripheral vascular disease) (HCC) 12/09/2012  . Depression 12/09/2012  . Chronic back pain 12/09/2012    Past Surgical History:  Procedure Laterality Date  . CORONARY ANGIOPLASTY WITH STENT PLACEMENT  01/15/98   PTCA stent RCA, PTCA diagonal  . CORONARY ANGIOPLASTY WITH STENT PLACEMENT  08/17/00   Cutting balloon ostial RCA  . CORONARY ANGIOPLASTY WITH STENT PLACEMENT  01/19/01   Unsuccessful attempted PTCA ostial RCA, stent protruding into root  . CORONARY ANGIOPLASTY WITH STENT PLACEMENT  01/20/01   RIMA-RCA, occluded cath 10/2003  . CORONARY ANGIOPLASTY WITH STENT PLACEMENT  02/02/04   Successful PTCA/drug eluding stent ostial RCA, [facility]  . TUBAL LIGATION      OB History  No data available       Home Medications    Prior to Admission medications   Medication Sig Start Date End Date Taking? Authorizing Provider  aspirin 81 MG tablet Take 81 mg by mouth daily.   Yes [provider]  atorvastatin (LIPITOR) 80 MG tablet Take 80 mg by mouth daily.   Yes [provider]  buPROPion (WELLBUTRIN XL) 300 MG 24 hr tablet Take 300 mg by mouth daily. 01/02/16  Yes [provider]  calcium-vitamin D (OSCAL WITH D) 500-200 MG-UNIT per tablet Take 1 tablet by mouth daily. 500 mg by mouth daily    Yes [provider]  cholecalciferol (VITAMIN D) 400 UNITS TABS tablet Take 1,000 Units by mouth daily.   Yes [provider]  cilostazol (PLETAL) 50 MG tablet Take 50 mg by mouth at bedtime.    Yes [provider]  clonazePAM (KLONOPIN) 1 MG tablet Take 1 mg by mouth daily.   Yes [provider]  DULoxetine (CYMBALTA) 60 MG capsule Take 1 capsule (60 mg total) by mouth daily. 12/12/12  Yes Laza, Sorin C, MD  esomeprazole (NEXIUM) 40 MG capsule Take 40 mg by mouth daily before breakfast.   Yes [provider]  isosorbide mononitrate (IMDUR) 60 MG 24 hr tablet Take 1 tablet (60 mg total) by mouth daily. 09/30/16  Yes Jake Bathe, MD  meclizine (ANTIVERT) 25 MG tablet Take 25 mg by mouth daily. 01/08/16  Yes [provider]  metFORMIN (GLUCOPHAGE) 500 MG tablet Take 500 mg by mouth at bedtime.    Yes [provider]  metoprolol succinate (TOPROL-XL) 50 MG 24 hr tablet Take two (2) tablet (100 mg) by mouth daily. 08/19/17  Yes Jake Bathe, MD  nabumetone (RELAFEN) 500 MG tablet Take 1,000 mg by mouth at bedtime.    Yes [provider]  omeprazole (PRILOSEC) 40 MG capsule Take 40 mg by mouth daily. 01/14/16  Yes [provider]  vitamin B-12 (CYANOCOBALAMIN) 1000 MCG tablet Take 2,000 mcg by mouth daily.   Yes [provider]  albuterol (PROVENTIL HFA;VENTOLIN HFA) 108 (90 BASE) MCG/ACT inhaler Inhale 2 puffs into the lungs every 6 (six) hours as needed for wheezing.     [provider]  EUCRISA 2 % OINT Apply 1 application topically daily. 10/28/17   [provider]  nitroGLYCERIN (NITROSTAT) 0.4 MG SL tablet Place 1 tablet (0.4 mg total) every 5 (five) minutes as needed under the tongue for chest pain. x3 doses as needed for chest pain 08/04/17   Jake Bathe, MD    Family History Family History  Problem Relation Age of Onset  . Heart attack Mother     Social History Social History    Tobacco Use  . Smoking status: Former Smoker    Types: Cigarettes    Last attempt to quit: 09/29/2010    Years since quitting: 7.1  . Smokeless tobacco: Never Used  Substance Use Topics  . Alcohol use: No  . Drug use: No     Allergies   Bactrim [sulfamethoxazole-trimethoprim]; Penicillins; and Sulfa antibiotics   Review of Systems Review of Systems  Constitutional: Negative for chills and fever.  HENT: Negative for congestion and rhinorrhea.   Eyes: Negative for redness and visual disturbance.  Respiratory: Positive for cough and shortness of breath. Negative for wheezing.   Cardiovascular: Negative for chest pain and palpitations.  Gastrointestinal: Negative for nausea and vomiting.  Genitourinary: Negative for dysuria and urgency.  Musculoskeletal: Negative for arthralgias and myalgias.  Skin: Negative for pallor and wound.  Neurological: Negative for dizziness and headaches.     Physical Exam Updated Vital Signs BP 110/85   Pulse (!) 103   Temp 98.3 F (36.8 C) (Oral)   Resp (!) 21   Ht 5' (1.524 m)   Wt 72.6 kg (160 lb)   SpO2 100%   BMI 31.25 kg/m   Physical Exam  Constitutional: She is oriented to person, place, and time. She appears well-developed and well-nourished. No distress.  HENT:  Head: Normocephalic and atraumatic.  Eyes: EOM are normal. Pupils are equal, round, and reactive to light.  Neck: Normal range of motion. Neck supple.  Cardiovascular: Normal rate and regular rhythm. Exam reveals no gallop and no friction rub.  No murmur heard. Pulmonary/Chest: Effort normal. Tachypnea noted. She has decreased breath sounds (mild diffuse). She has no wheezes. She has no rales.  Abdominal: Soft. She exhibits no distension. There is no tenderness.  Musculoskeletal: She exhibits no edema or tenderness.  Neurological: She is alert and oriented to person, place, and time.  Skin: Skin is warm and dry. She is not diaphoretic.  Psychiatric: She has a normal  mood and affect. Her behavior is normal.  Nursing note and vitals reviewed.    ED Treatments / Results  Labs (all labs ordered are listed, but only abnormal results are displayed) Labs Reviewed  BASIC METABOLIC PANEL - Abnormal; Notable for the following components:      Result Value   CO2 21 (*)    Glucose, Bld 112 (*)    Creatinine, Ser 1.44 (*)    GFR calc non Af Amer 37 (*)    GFR calc Af Amer 42 (*)    All other components within normal limits  CBC - Abnormal; Notable for the following components:   MCH 34.5 (*)    All other components within normal limits  TSH  I-STAT TROPONIN, ED  I-STAT CG4 LACTIC ACID, ED  I-STAT CG4 LACTIC ACID, ED    EKG  EKG Interpretation  Date/Time:  Tuesday November 24 2017 16:42:25 EST Ventricular Rate:  141 PR Interval:    QRS Duration: 84 QT Interval:  308 QTC Calculation: 471 R Axis:   47 Text Interpretation:  Supraventricular tachycardia ST & T wave abnormality, consider inferior ischemia Abnormal ECG Since last tracing rate faster Otherwise no significant change Confirmed by Melene Plan [phone]) on 11/24/2017 6:50:44 PM        EKG Interpretation  Date/Time:  Tuesday November 24 2017 23:57:10 EST Ventricular Rate:  104 PR Interval:    QRS Duration: 91 QT Interval:  309 QTC Calculation: 407 R Axis:   53 Text Interpretation:  Sinus tachycardia with irregular rate Low voltage, precordial leads Borderline repolarization abnormality p wave morphology change from prior Since last tracing rate slower Confirmed by Melene Plan [phone]) on 11/25/2017 12:15:45 AM        Radiology Dg Chest 2 View  Result Date: 11/24/2017 CLINICAL DATA:  Productive cough EXAM: CHEST  2 VIEW COMPARISON:  12/09/2012 FINDINGS: Post sternotomy changes. No focal consolidation or effusion. Cardiomediastinal silhouette within normal limits. Aortic atherosclerosis. No pneumothorax. IMPRESSION: No active cardiopulmonary disease. Electronically Signed   By: Jasmine Pang M.D.   On: 11/24/2017 17:02   Ct Angio Chest Pe W And/or Wo Contrast  Result Date: 11/24/2017 CLINICAL DATA:  Productive cough, shortness of breath, chest pain EXAM: CT ANGIOGRAPHY CHEST WITH CONTRAST TECHNIQUE: Multidetector CT imaging  of the chest was performed using the standard protocol during bolus administration of intravenous contrast. Multiplanar CT image reconstructions and MIPs were obtained to evaluate the vascular anatomy. CONTRAST:  ISOVUE-370 IOPAMIDOL (ISOVUE-370) INJECTION 76% COMPARISON:  Chest x-ray earlier today FINDINGS: Cardiovascular: Diffuse aortic calcifications. Heart is normal size. No filling defects in the pulmonary arteries to suggest pulmonary emboli. Mediastinum/Nodes: No mediastinal, hilar, or axillary adenopathy. Lungs/Pleura: No confluent airspace opacities or effusions. Upper Abdomen: Imaging into the upper abdomen shows no acute findings. Musculoskeletal: Chest wall soft tissues are unremarkable. No acute bony abnormality. Review of the MIP images confirms the above findings. IMPRESSION: No evidence of pulmonary embolus. Prior CABG. No acute cardiopulmonary disease. Aortic Atherosclerosis (ICD10-I70.0). Electronically Signed   By: Charlett Nose M.D.   On: 11/24/2017 21:50    Procedures Procedures (including critical care time)  Medications Ordered in ED Medications  diltiazem (CARDIZEM) 1 mg/mL load via infusion 20 mg (not administered)    And  diltiazem (CARDIZEM) 100 mg in dextrose 5% (1 mg/mL) infusion (0 mg/hr Intravenous Hold 11/25/17 0011)  sodium chloride 0.9 % bolus 1,000 mL (0 mLs Intravenous Stopped 11/24/17 2011)  chlorpheniramine-HYDROcodone (TUSSIONEX) 10-8 MG/5ML suspension 5 mL (5 mLs Oral Given 11/24/17 2030)  sodium chloride 0.9 % bolus 1,000 mL (0 mLs Intravenous Stopped 11/24/17 2214)  iopamidol (ISOVUE-370) 76 % injection (100 mLs  Contrast Given 11/24/17 2130)  metoprolol tartrate (LOPRESSOR) injection 5 mg (5 mg Intravenous  Given 11/24/17 2314)     Initial Impression / Assessment and Plan / ED Course  I have reviewed the triage vital signs and the nursing notes.  Pertinent labs & imaging results that were available during my care of the patient were reviewed by me and considered in my medical decision making (see chart for details).     67 yo F with a chief complaint of shortness of breath.  The patient is significantly tachycardic with a heart rate into the 140s.  She has no history of atrial fibrillation.  On my exam the heart rate appears to be regular.  She was given 2 L of fluid with very minimal improvement of her heart rate.  CT scan of the chest was negative for pulmonary embolus.  There is no pneumonia.  She does have a AK I.  Potentially this is all dehydration.  I will give a dose of metoprolol to try and further evaluate the heart rate.  Repeat EKG looks like a sinus tachycardia.  With its persistence and AKI will discuss with the hospitalist for admission.  HR not improved with metop.  Start on dilt drip.  ? Aflutter will discuss with cards.   Patient spontaneously changed rhythm now at a rate in the low 100's.  P wave inverted, irregular.  Cards to eval. Cards to admit.   CRITICAL CARE Performed by: Rae Roam   Total critical care time: 80 minutes  Critical care time was exclusive of separately billable procedures and treating other patients.  Critical care was necessary to treat or prevent imminent or life-threatening deterioration.  Critical care was time spent personally by me on the following activities: development of treatment plan with patient and/or surrogate as well as nursing, discussions with consultants, evaluation of patient's response to treatment, examination of patient, obtaining history from patient or surrogate, ordering and performing treatments and interventions, ordering and review of laboratory studies, ordering and review of radiographic studies, pulse oximetry  and re-evaluation of patient's condition.   The patients results  and plan were reviewed and discussed.   Any x-rays performed were independently reviewed by myself.   Differential diagnosis were considered with the presenting HPI.  Medications  diltiazem (CARDIZEM) 1 mg/mL load via infusion 20 mg (not administered)    And  diltiazem (CARDIZEM) 100 mg in dextrose 5% (1 mg/mL) infusion (0 mg/hr Intravenous Hold 11/25/17 0011)  sodium chloride 0.9 % bolus 1,000 mL (0 mLs Intravenous Stopped 11/24/17 2011)  chlorpheniramine-HYDROcodone (TUSSIONEX) 10-8 MG/5ML suspension 5 mL (5 mLs Oral Given 11/24/17 2030)  sodium chloride 0.9 % bolus 1,000 mL (0 mLs Intravenous Stopped 11/24/17 2214)  iopamidol (ISOVUE-370) 76 % injection (100 mLs  Contrast Given 11/24/17 2130)  metoprolol tartrate (LOPRESSOR) injection 5 mg (5 mg Intravenous Given 11/24/17 2314)    Vitals:   11/24/17 2230 11/24/17 2300 11/24/17 2330 11/25/17 0000  BP: (!) 147/70 121/68 128/77 110/85  Pulse: (!) 135 (!) 129 (!) 115 (!) 103  Resp: 16 20 (!) 21 (!) 21  Temp:      TempSrc:      SpO2: 98% 98% 97% 100%  Weight:      Height:        Final diagnoses:  AKI (acute kidney injury) (HCC)  Sinus arrhythmia    Admission/ observation were discussed with the admitting physician, patient and/or family and they are comfortable with the plan.    Final Clinical Impressions(s) / ED Diagnoses   Final diagnoses:  AKI (acute kidney injury) [facility])  Sinus arrhythmia    ED Discharge Orders    None       Melene Plan, DO 11/25/17 1504

## 2017-11-24 NOTE — ED Triage Notes (Signed)
Pt was sent from PCP for Tachardia. Pt has productive cough x 1 month with sob and chest pain while coughing. Pt has constant hacking cough while in triage.

## 2017-11-25 ENCOUNTER — Observation Stay (HOSPITAL_BASED_OUTPATIENT_CLINIC_OR_DEPARTMENT_OTHER): Payer: Medicare Other

## 2017-11-25 ENCOUNTER — Other Ambulatory Visit: Payer: Self-pay

## 2017-11-25 DIAGNOSIS — R05 Cough: Secondary | ICD-10-CM | POA: Diagnosis not present

## 2017-11-25 DIAGNOSIS — I25810 Atherosclerosis of coronary artery bypass graft(s) without angina pectoris: Secondary | ICD-10-CM

## 2017-11-25 DIAGNOSIS — I361 Nonrheumatic tricuspid (valve) insufficiency: Secondary | ICD-10-CM | POA: Diagnosis not present

## 2017-11-25 DIAGNOSIS — J449 Chronic obstructive pulmonary disease, unspecified: Secondary | ICD-10-CM | POA: Diagnosis not present

## 2017-11-25 DIAGNOSIS — I471 Supraventricular tachycardia: Secondary | ICD-10-CM | POA: Diagnosis not present

## 2017-11-25 DIAGNOSIS — R0609 Other forms of dyspnea: Secondary | ICD-10-CM | POA: Diagnosis not present

## 2017-11-25 DIAGNOSIS — R000 Tachycardia, unspecified: Secondary | ICD-10-CM | POA: Diagnosis not present

## 2017-11-25 DIAGNOSIS — R079 Chest pain, unspecified: Secondary | ICD-10-CM | POA: Diagnosis not present

## 2017-11-25 DIAGNOSIS — J45909 Unspecified asthma, uncomplicated: Secondary | ICD-10-CM

## 2017-11-25 LAB — ECHOCARDIOGRAM COMPLETE
Height: 60 in
Weight: 2624 oz

## 2017-11-25 LAB — TSH: TSH: 0.971 u[IU]/mL (ref 0.350–4.500)

## 2017-11-25 LAB — BASIC METABOLIC PANEL
Anion gap: 11 (ref 5–15)
BUN: 12 mg/dL (ref 6–20)
CO2: 18 mmol/L — ABNORMAL LOW (ref 22–32)
Calcium: 8 mg/dL — ABNORMAL LOW (ref 8.9–10.3)
Chloride: 113 mmol/L — ABNORMAL HIGH (ref 101–111)
Creatinine, Ser: 1.49 mg/dL — ABNORMAL HIGH (ref 0.44–1.00)
GFR calc Af Amer: 41 mL/min — ABNORMAL LOW (ref >60)
GFR calc non Af Amer: 35 mL/min — ABNORMAL LOW (ref >60)
Glucose, Bld: 136 mg/dL — ABNORMAL HIGH (ref 65–99)
Potassium: 3.5 mmol/L (ref 3.5–5.1)
Sodium: 142 mmol/L (ref 135–145)

## 2017-11-25 LAB — BRAIN NATRIURETIC PEPTIDE: B Natriuretic Peptide: 710.6 pg/mL — ABNORMAL HIGH (ref 0.0–100.0)

## 2017-11-25 LAB — TROPONIN I: Troponin I: 0.03 ng/mL (ref <0.03)

## 2017-11-25 LAB — I-STAT CG4 LACTIC ACID, ED: Lactic Acid, Venous: 0.55 mmol/L (ref 0.5–1.9)

## 2017-11-25 MED ORDER — ACETAMINOPHEN 325 MG PO TABS: 650.0000 mg | ORAL_TABLET | ORAL | Status: DC | PRN

## 2017-11-25 MED ORDER — ONDANSETRON HCL 4 MG/2ML IJ SOLN
4.0000 mg | Freq: Four times a day (QID) | INTRAMUSCULAR | Status: DC | PRN
Start: 2017-11-25 — End: 2017-11-26

## 2017-11-25 MED ORDER — GUAIFENESIN-DM 100-10 MG/5ML PO SYRP
5.0000 mL | ORAL_SOLUTION | ORAL | Status: DC | PRN
  Administered 2017-11-25 – 2017-11-26 (×3): 5 mL via ORAL
  Filled 2017-11-25 (×4): qty 5

## 2017-11-25 MED ORDER — FUROSEMIDE 10 MG/ML IJ SOLN
40.0000 mg | Freq: Once | INTRAMUSCULAR | Status: AC
  Administered 2017-11-25: 40 mg via INTRAVENOUS
  Filled 2017-11-25: qty 4

## 2017-11-25 MED ORDER — ISOSORBIDE MONONITRATE ER 60 MG PO TB24
60.0000 mg | ORAL_TABLET | Freq: Every day | ORAL | Status: DC
  Administered 2017-11-25 – 2017-11-26 (×2): 60 mg via ORAL
  Filled 2017-11-25 (×2): qty 1

## 2017-11-25 MED ORDER — PANTOPRAZOLE SODIUM 40 MG PO TBEC
40.0000 mg | DELAYED_RELEASE_TABLET | Freq: Every day | ORAL | Status: DC
  Administered 2017-11-25 – 2017-11-26 (×2): 40 mg via ORAL
  Filled 2017-11-25 (×2): qty 1

## 2017-11-25 MED ORDER — DULOXETINE HCL 60 MG PO CPEP
60.0000 mg | ORAL_CAPSULE | Freq: Every day | ORAL | Status: DC
  Administered 2017-11-25 – 2017-11-26 (×2): 60 mg via ORAL
  Filled 2017-11-25 (×2): qty 1

## 2017-11-25 MED ORDER — ATORVASTATIN CALCIUM 80 MG PO TABS
80.0000 mg | ORAL_TABLET | Freq: Every day | ORAL | Status: DC
  Administered 2017-11-25 – 2017-11-26 (×2): 80 mg via ORAL
  Filled 2017-11-25 (×2): qty 1

## 2017-11-25 MED ORDER — METOPROLOL SUCCINATE ER 100 MG PO TB24
100.0000 mg | ORAL_TABLET | Freq: Every day | ORAL | Status: DC
  Administered 2017-11-25 – 2017-11-26 (×2): 100 mg via ORAL
  Filled 2017-11-25 (×2): qty 1

## 2017-11-25 MED ORDER — ASPIRIN 81 MG PO CHEW
81.0000 mg | CHEWABLE_TABLET | Freq: Every day | ORAL | Status: DC
  Administered 2017-11-25 – 2017-11-26 (×2): 81 mg via ORAL
  Filled 2017-11-25 (×2): qty 1

## 2017-11-25 MED ORDER — BUPROPION HCL ER (XL) 300 MG PO TB24
300.0000 mg | ORAL_TABLET | Freq: Every day | ORAL | Status: DC
  Administered 2017-11-25 – 2017-11-26 (×2): 300 mg via ORAL
  Filled 2017-11-25 (×2): qty 1

## 2017-11-25 MED ORDER — PREDNISONE 20 MG PO TABS
20.0000 mg | ORAL_TABLET | Freq: Every day | ORAL | Status: DC
  Administered 2017-11-26: 20 mg via ORAL
  Filled 2017-11-25: qty 1

## 2017-11-25 MED ORDER — CILOSTAZOL 50 MG PO TABS
50.0000 mg | ORAL_TABLET | Freq: Every day | ORAL | Status: DC
  Administered 2017-11-25: 50 mg via ORAL
  Filled 2017-11-25: qty 1

## 2017-11-25 MED ORDER — PREDNISONE 20 MG PO TABS
40.0000 mg | ORAL_TABLET | Freq: Once | ORAL | Status: AC
  Administered 2017-11-25: 40 mg via ORAL
  Filled 2017-11-25: qty 2

## 2017-11-25 MED ORDER — HYDROCOD POLST-CPM POLST ER 10-8 MG/5ML PO SUER
5.0000 mL | Freq: Two times a day (BID) | ORAL | Status: DC
  Administered 2017-11-25 – 2017-11-26 (×3): 5 mL via ORAL
  Filled 2017-11-25 (×3): qty 5

## 2017-11-25 NOTE — Consult Note (Addendum)
ELECTROPHYSIOLOGY CONSULT NOTE    Patient ID: Crystal [name] MRN: [id number], DOB/AGE: 1951/07/14 67 y.o.  Admit date: 11/24/2017 Date of Consult: 11/25/2017  Primary Physician: Lonie Peak, PA-C Primary Cardiologist: Anne Fu Electrophysiologist: Elberta Fortis (new this admission)  Patient Profile: Crystal [name] is a 67 y.o. female with a history of CAD, diabetes, and hypertension who is being seen today for the evaluation of tachycardia at the request of Dr Rennis Golden.  HPI:  Crystal [name] is a 67 y.o. female with the above past medical history. For the last 6 weeks she has had a dry cough as well as fatigue and exercise intolerance.  She had no awareness of her heart rate.  She went to her PCP's office for cough evaluation and was found to be tachycardic.  She was transferred to [facility] for further evaluation. She was found to be in atrial tachycardia with rates in the 140's.  EP has been asked to evaluate for treatment options.   Echo has been done but is pending. She is on BB therapy. At the time of my exam, she is back in SR and feels some better but is unable to quantify exactly how.   She denies chest pain, palpitations, cough, +bendopnea, nausea, vomiting, dizziness, syncope, weight gain, or early satiety.  Past Medical History:  Diagnosis Date  . Angina pectoris (HCC)   . Anxiety   . ASCVD (arteriosclerotic cardiovascular disease)    single vessel  . ASD (atrial septal defect)    Small ASD with insignificant shunt  . Back pain, chronic   . Chronic ischemic heart disease   . Claudication (HCC)   . Coronary atherosclerosis of native coronary artery   . Depression   . Diabetes mellitus without complication (HCC)   . Edema   . GERD (gastroesophageal reflux disease)   . Hypercholesteremia   . Hypertension   . MRSA carrier    Chronic  . Occlusion and stenosis of carotid artery without mention of cerebral infarction    Last carotid doppler was in 2006. Patch angioplasty right femoral  artery 01/1998, patenet 09/2005 ([facility])  . Osteoarthritis   . Osteopenia   . Other and unspecified angina pectoris   . PAD (peripheral artery disease) (HCC)   . Postsurgical aortocoronary bypass status   . Postsurgical percutaneous transluminal coronary angioplasty status   . Pseudodementia   . Right carotid bruit    Negative doppler 09/2002  . SOB (shortness of breath)    ECHO 04/13/12 EF estimated at 55-60%     Surgical History:  Past Surgical History:  Procedure Laterality Date  . CORONARY ANGIOPLASTY WITH STENT PLACEMENT  01/15/98   PTCA stent RCA, PTCA diagonal  . CORONARY ANGIOPLASTY WITH STENT PLACEMENT  08/17/00   Cutting balloon ostial RCA  . CORONARY ANGIOPLASTY WITH STENT PLACEMENT  01/19/01   Unsuccessful attempted PTCA ostial RCA, stent protruding into root  . CORONARY ANGIOPLASTY WITH STENT PLACEMENT  01/20/01   RIMA-RCA, occluded cath 10/2003  . CORONARY ANGIOPLASTY WITH STENT PLACEMENT  02/02/04   Successful PTCA/drug eluding stent ostial RCA, [facility]  . TUBAL LIGATION       Medications Prior to Admission  Medication Sig Dispense Refill Last Dose  . aspirin 81 MG tablet Take 81 mg by mouth daily.   11/23/2017 at Unknown time  . atorvastatin (LIPITOR) 80 MG tablet Take 80 mg by mouth daily.   11/23/2017 at Unknown time  . buPROPion (WELLBUTRIN XL) 300 MG 24 hr tablet Take 300 mg  by mouth daily.   11/23/2017 at Unknown time  . calcium-vitamin D (OSCAL WITH D) 500-200 MG-UNIT per tablet Take 1 tablet by mouth daily. 500 mg by mouth daily   11/23/2017 at Unknown time  . cholecalciferol (VITAMIN D) 400 UNITS TABS tablet Take 1,000 Units by mouth daily.   11/23/2017 at Unknown time  . cilostazol (PLETAL) 50 MG tablet Take 50 mg by mouth at bedtime.    11/23/2017 at Unknown time  . clonazePAM (KLONOPIN) 1 MG tablet Take 1 mg by mouth daily.   11/23/2017 at Unknown time  . DULoxetine (CYMBALTA) 60 MG capsule Take 1 capsule (60 mg total) by mouth daily.   11/23/2017 at Unknown time    . esomeprazole (NEXIUM) 40 MG capsule Take 40 mg by mouth daily before breakfast.   11/23/2017 at Unknown time  . isosorbide mononitrate (IMDUR) 60 MG 24 hr tablet Take 1 tablet (60 mg total) by mouth daily. 30 tablet 11 11/23/2017 at Unknown time  . meclizine (ANTIVERT) 25 MG tablet Take 25 mg by mouth daily.   11/23/2017 at Unknown time  . metFORMIN (GLUCOPHAGE) 500 MG tablet Take 500 mg by mouth at bedtime.    11/23/2017 at Unknown time  . metoprolol succinate (TOPROL-XL) 50 MG 24 hr tablet Take two (2) tablet (100 mg) by mouth daily. 180 tablet 1 11/23/2017 at Unknown time  . nabumetone (RELAFEN) 500 MG tablet Take 1,000 mg by mouth at bedtime.    11/23/2017 at Unknown time  . omeprazole (PRILOSEC) 40 MG capsule Take 40 mg by mouth daily.   11/23/2017 at Unknown time  . vitamin B-12 (CYANOCOBALAMIN) 1000 MCG tablet Take 2,000 mcg by mouth daily.   11/23/2017 at Unknown time  . albuterol (PROVENTIL HFA;VENTOLIN HFA) 108 (90 BASE) MCG/ACT inhaler Inhale 2 puffs into the lungs every 6 (six) hours as needed for wheezing.    Taking  . EUCRISA 2 % OINT Apply 1 application topically daily.     . nitroGLYCERIN (NITROSTAT) 0.4 MG SL tablet Place 1 tablet (0.4 mg total) every 5 (five) minutes as needed under the tongue for chest pain. x3 doses as needed for chest pain 25 tablet 3     Inpatient Medications:  . aspirin  81 mg Oral Daily  . atorvastatin  80 mg Oral Daily  . buPROPion  300 mg Oral Daily  . chlorpheniramine-HYDROcodone  5 mL Oral Q12H  . cilostazol  50 mg Oral QHS  . DULoxetine  60 mg Oral Daily  . isosorbide mononitrate  60 mg Oral Daily  . metoprolol succinate  100 mg Oral Daily  . pantoprazole  40 mg Oral QAC breakfast  . predniSONE  40 mg Oral Once   Followed by  . [START ON 11/26/2017] predniSONE  20 mg Oral Daily    Allergies:  Allergies  Allergen Reactions  . Bactrim [Sulfamethoxazole-Trimethoprim] Nausea And Vomiting  . Penicillins     Unknown   . Sulfa Antibiotics      unknown    Social History   Socioeconomic History  . Marital status: Divorced    Spouse name: Not on file  . Number of children: Not on file  . Years of education: Not on file  . Highest education level: Not on file  Social Needs  . Financial resource strain: Not on file  . Food insecurity - worry: Not on file  . Food insecurity - inability: Not on file  . Transportation needs - medical: Not on file  . Transportation  needs - non-medical: Not on file  Occupational History  . Not on file  Tobacco Use  . Smoking status: Former Smoker    Types: Cigarettes    Last attempt to quit: 09/29/2010    Years since quitting: 7.1  . Smokeless tobacco: Never Used  Substance and Sexual Activity  . Alcohol use: No  . Drug use: No  . Sexual activity: Not on file  Other Topics Concern  . Not on file  Social History Narrative  . Not on file     Family History  Problem Relation Age of Onset  . Heart attack Mother      Review of Systems: All other systems reviewed and are otherwise negative except as noted above.  Physical Exam: Vitals:   11/25/17 0430 11/25/17 0529 11/25/17 0830 11/25/17 1100  BP: 115/60 (!) 146/85 (!) 141/65 (!) 134/55  Pulse: (!) 48 (!) 102 70 (!) 54  Resp: 18 20 18 20   Temp:  (!) 97.5 F (36.4 C) 99 F (37.2 C) 98.5 F (36.9 C)  TempSrc:  Oral Oral Oral  SpO2: 98% 100% 99% 98%  Weight:  164 lb (74.4 kg)    Height:  5' (1.524 m)      GEN- The patient is well appearing, alert and oriented x 3 today.   HEENT: normocephalic, atraumatic; sclera clear, conjunctiva pink; hearing intact; oropharynx clear; neck supple Lungs- Clear to ausculation bilaterally, normal work of breathing.  No wheezes, rales, rhonchi Heart- Regular rate and rhythm  GI- soft, non-tender, non-distended, bowel sounds present Extremities- no clubbing, cyanosis, or edema  MS- no significant deformity or atrophy Skin- warm and dry, no rash or lesion Psych- euthymic mood, full affect Neuro-  strength and sensation are intact  Labs:   Lab Results  Component Value Date   WBC 10.3 11/24/2017   HGB 13.4 11/24/2017   HCT 38.5 11/24/2017   MCV 99.2 11/24/2017   PLT 285 11/24/2017    Recent Labs  Lab 11/25/17 0328  NA 142  K 3.5  CL 113*  CO2 18*  BUN 12  CREATININE 1.49*  CALCIUM 8.0*  GLUCOSE 136*      Radiology/Studies: Dg Chest 2 View  Result Date: 11/24/2017 CLINICAL DATA:  Productive cough EXAM: CHEST  2 VIEW COMPARISON:  12/09/2012 FINDINGS: Post sternotomy changes. No focal consolidation or effusion. Cardiomediastinal silhouette within normal limits. Aortic atherosclerosis. No pneumothorax. IMPRESSION: No active cardiopulmonary disease. Electronically Signed   By: Jasmine PangKim  Fujinaga M.D.   On: 11/24/2017 17:02   Ct Angio Chest Pe W And/or Wo Contrast  Result Date: 11/24/2017 CLINICAL DATA:  Productive cough, shortness of breath, chest pain EXAM: CT ANGIOGRAPHY CHEST WITH CONTRAST TECHNIQUE: Multidetector CT imaging of the chest was performed using the standard protocol during bolus administration of intravenous contrast. Multiplanar CT image reconstructions and MIPs were obtained to evaluate the vascular anatomy. CONTRAST:  100mL ISOVUE-370 IOPAMIDOL (ISOVUE-370) INJECTION 76% COMPARISON:  Chest x-ray earlier today FINDINGS: Cardiovascular: Diffuse aortic calcifications. Heart is normal size. No filling defects in the pulmonary arteries to suggest pulmonary emboli. Mediastinum/Nodes: No mediastinal, hilar, or axillary adenopathy. Lungs/Pleura: No confluent airspace opacities or effusions. Upper Abdomen: Imaging into the upper abdomen shows no acute findings. Musculoskeletal: Chest wall soft tissues are unremarkable. No acute bony abnormality. Review of the MIP images confirms the above findings. IMPRESSION: No evidence of pulmonary embolus. Prior CABG. No acute cardiopulmonary disease. Aortic Atherosclerosis (ICD10-I70.0). Electronically Signed   By: Charlett NoseKevin  Dover M.D.   On:  11/24/2017 21:50    ZOX:[id number] tachycardia (personally reviewed)  TELEMETRY: AT ->SR (personally reviewed)  Assessment/Plan: 1.  Atrial tachycardia Unclear duration No clear evidence of AF Echo pending She is currently in SR With cough, ?if tachycardia mediated cardiomyopathy - Iman Reinertsen await echo She is largely unaware of palpitations, if EF down, may need to consider AAD therapy. Can not use 1C agents with CAD, Sotalol may be good option.   EP Mir Fullilove follow up tomorrow after echo resulted  Signed, Gypsy Balsam 11/25/2017 4:08 PM   I have seen and examined this patient with Gypsy Balsam.  Agree with above, note added to reflect my findings.  On exam, RRR, no murmurs, lungs clear.  Patient presented to her PCPs office for cough, found to be tachycardic.  She was referred to [facility] where she was found to be in likely atrial tachycardia with heart rates in the 140s.  She would likely need some sort of medication to keep her heart rate better controlled and out of atrial tachycardia.  She currently has an echocardiogram pending.  She is on beta-blocker therapy.  We Valerie Cones consider antiarrhythmic therapy once her echocardiogram has been completed.  Tosca Pletz M. Areg Bialas MD 11/25/2017 5:20 PM  `

## 2017-11-25 NOTE — Consult Note (Signed)
Name: Crystal [name] MRN: [id number] DOB: 03/12/1951    ADMISSION DATE:  11/24/2017 CONSULTATION DATE:  11/25/17  REFERRING MD :  Dr. Rennis Golden   CHIEF COMPLAINT:  Cough    HISTORY OF PRESENT ILLNESS:  67 y/o F who presented to [facility] on 2/26 from her primary office with reports of an irregular heart rate.    PMH of CAD s/p CABG, HTN, DM and suspected COPD.    Patient reports a 1.5 month hx of runny nose & cough.  Pt reports cough is dry and "can't get anything up".  Cough is the same during the day and at night.  She reports she has tried a "whole bunch of different cough syrups but they didn't work".  Reports low grade fevers to 99.  She went to see her Primary MD for cough on 2/26 and was found at that time to have a regular / fast heart rate to 140's.  She reports wheezing during this episode of illness.  Prior to the illness she only occasionally wheezed.  The patient reports she has heart burn "all the time".  She denies post nasal gtt.  She has never been told she has asthma but does have an inhaler that she uses 1-2 x per month.  She is a former smoker, quit in 2009.  She began smoking at age 75 and smoked up to 2ppd.  She decided she was just going to quit and stopped.  At baseline, she reports she can usually walk 15-20 ft and becomes very short of breath.  She states she has a really hard time with walking stairs.    On admit she was found to have regular narrow complex tachycardia.  She was admitted by Cardiology for evaluation.  She had a CXR which was negative for acute process.  TSH assessed and within normal limits.  CTA of the chest was negative for PE but it did show small faint areas of background ground glass (tiny).   BNP 710, troponin 0.03.  The patient had persistent cough and PCCM consulted for evaluation 2/27.    PAST MEDICAL HISTORY :   has a past medical history of Angina pectoris (HCC), Anxiety, ASCVD (arteriosclerotic cardiovascular disease), ASD (atrial septal defect), Back  pain, chronic, Chronic ischemic heart disease, Claudication (HCC), Coronary atherosclerosis of native coronary artery, Depression, Diabetes mellitus without complication (HCC), Edema, GERD (gastroesophageal reflux disease), Hypercholesteremia, Hypertension, MRSA carrier, Occlusion and stenosis of carotid artery without mention of cerebral infarction, Osteoarthritis, Osteopenia, Other and unspecified angina pectoris, PAD (peripheral artery disease) (HCC), Postsurgical aortocoronary bypass status, Postsurgical percutaneous transluminal coronary angioplasty status, Pseudodementia, Right carotid bruit, and SOB (shortness of breath).   has a past surgical history that includes Tubal ligation; Coronary angioplasty with stent (01/15/98); Coronary angioplasty with stent (08/17/00); Coronary angioplasty with stent (01/19/01); Coronary angioplasty with stent (01/20/01); and Coronary angioplasty with stent (02/02/04).  Prior to Admission medications   Medication Sig Start Date End Date Taking? Authorizing Provider  aspirin 81 MG tablet Take 81 mg by mouth daily.   Yes [provider]  atorvastatin (LIPITOR) 80 MG tablet Take 80 mg by mouth daily.   Yes [provider]  buPROPion (WELLBUTRIN XL) 300 MG 24 hr tablet Take 300 mg by mouth daily. 01/02/16  Yes [provider]  calcium-vitamin D (OSCAL WITH D) 500-200 MG-UNIT per tablet Take 1 tablet by mouth daily. 500 mg by mouth daily   Yes [provider]  cholecalciferol (VITAMIN D) 400 UNITS  TABS tablet Take 1,000 Units by mouth daily.   Yes [provider]  cilostazol (PLETAL) 50 MG tablet Take 50 mg by mouth at bedtime.    Yes [provider]  clonazePAM (KLONOPIN) 1 MG tablet Take 1 mg by mouth daily.   Yes [provider]  DULoxetine (CYMBALTA) 60 MG capsule Take 1 capsule (60 mg total) by mouth daily. 12/12/12  Yes Laza, Sorin C, MD  esomeprazole (NEXIUM) 40 MG capsule Take 40 mg by mouth daily before  breakfast.   Yes [provider]  isosorbide mononitrate (IMDUR) 60 MG 24 hr tablet Take 1 tablet (60 mg total) by mouth daily. 09/30/16  Yes Jake Bathe, MD  meclizine (ANTIVERT) 25 MG tablet Take 25 mg by mouth daily. 01/08/16  Yes [provider]  metFORMIN (GLUCOPHAGE) 500 MG tablet Take 500 mg by mouth at bedtime.    Yes [provider]  metoprolol succinate (TOPROL-XL) 50 MG 24 hr tablet Take two (2) tablet (100 mg) by mouth daily. 08/19/17  Yes Jake Bathe, MD  nabumetone (RELAFEN) 500 MG tablet Take 1,000 mg by mouth at bedtime.    Yes [provider]  omeprazole (PRILOSEC) 40 MG capsule Take 40 mg by mouth daily. 01/14/16  Yes [provider]  vitamin B-12 (CYANOCOBALAMIN) 1000 MCG tablet Take 2,000 mcg by mouth daily.   Yes [provider]  albuterol (PROVENTIL HFA;VENTOLIN HFA) 108 (90 BASE) MCG/ACT inhaler Inhale 2 puffs into the lungs every 6 (six) hours as needed for wheezing.     [provider]  EUCRISA 2 % OINT Apply 1 application topically daily. 10/28/17   [provider]  nitroGLYCERIN (NITROSTAT) 0.4 MG SL tablet Place 1 tablet (0.4 mg total) every 5 (five) minutes as needed under the tongue for chest pain. x3 doses as needed for chest pain 08/04/17   Jake Bathe, MD    Allergies  Allergen Reactions  . Bactrim [Sulfamethoxazole-Trimethoprim] Nausea And Vomiting  . Penicillins     Unknown   . Sulfa Antibiotics     unknown    FAMILY HISTORY:  family history includes Heart attack in her mother.  SOCIAL HISTORY:  reports that she quit smoking about 7 years ago. Her smoking use included cigarettes. she has never used smokeless tobacco. She reports that she does not drink alcohol or use drugs.  REVIEW OF SYSTEMS:  POSITIVES IN BOLD Constitutional: Negative for fever, chills, weight loss, malaise/fatigue and diaphoresis.  HENT: Negative for hearing loss, ear pain, nosebleeds, congestion, sore  throat, neck pain, tinnitus and ear discharge.   Eyes: Negative for blurred vision, double vision, photophobia, pain, discharge and redness.  Respiratory: Negative for non-productive cough, hemoptysis, sputum production, shortness of breath, wheezing and stridor.   Cardiovascular: Negative for chest pain, palpitations, orthopnea, claudication, leg swelling and PND.  Gastrointestinal: Negative for heartburn, nausea, vomiting, abdominal pain, diarrhea, constipation, blood in stool and melena.  Genitourinary: Negative for dysuria, urgency, frequency, hematuria and flank pain.  Musculoskeletal: Negative for myalgias, back pain, joint pain and falls.  Skin: Negative for itching and rash.  Neurological: Negative for dizziness, tingling, tremors, sensory change, speech change, focal weakness, seizures, loss of consciousness, weakness and headaches.  Endo/Heme/Allergies: Negative for environmental allergies and polydipsia. Does not bruise/bleed easily.   SUBJECTIVE:   VITAL SIGNS: Temp:  [97.5 F (36.4 C)-99 F (37.2 C)] 98.5 F (36.9 C) (02/27 1100) Pulse Rate:  [47-140] 54 (02/27 1100) Resp:  [15-29] 20 (02/27 1100) BP: (  100-155)/(49-123) 134/55 (02/27 1100) SpO2:  [96 %-100 %] 98 % (02/27 1100) Weight:  [160 lb (72.6 kg)-164 lb (74.4 kg)] 164 lb (74.4 kg) (02/27 0529)  PHYSICAL EXAMINATION: General:  Adult female in NAD lying in bed Neuro:  AAOx4, speech clear, MAE  HEENT:  Dry cough, lungs bilaterally  Cardiovascular:  s1s2 rrr, no m/r/g, regular pulses  Lungs:  Even/non-labored, lungs bilaterally clear anterior, forced exp rhonchi after coughing  Abdomen:  Obese/soft, bsx4 Musculoskeletal:  No acute deformities  Skin:  Warm/dry, no rashes or lesions   Recent Labs  Lab 11/24/17 1643 11/25/17 0328  NA 142 142  K 4.1 3.5  CL 109 113*  CO2 21* 18*  BUN 11 12  CREATININE 1.44* 1.49*  GLUCOSE 112* 136*    Recent Labs  Lab 11/24/17 1643  HGB 13.4  HCT 38.5  WBC 10.3  PLT  285    Dg Chest 2 View  Result Date: 11/24/2017 CLINICAL DATA:  Productive cough EXAM: CHEST  2 VIEW COMPARISON:  12/09/2012 FINDINGS: Post sternotomy changes. No focal consolidation or effusion. Cardiomediastinal silhouette within normal limits. Aortic atherosclerosis. No pneumothorax. IMPRESSION: No active cardiopulmonary disease. Electronically Signed   By: Jasmine PangKim  Fujinaga M.D.   On: 11/24/2017 17:02   Ct Angio Chest Pe W And/or Wo Contrast  Result Date: 11/24/2017 CLINICAL DATA:  Productive cough, shortness of breath, chest pain EXAM: CT ANGIOGRAPHY CHEST WITH CONTRAST TECHNIQUE: Multidetector CT imaging of the chest was performed using the standard protocol during bolus administration of intravenous contrast. Multiplanar CT image reconstructions and MIPs were obtained to evaluate the vascular anatomy. CONTRAST:  100mL ISOVUE-370 IOPAMIDOL (ISOVUE-370) INJECTION 76% COMPARISON:  Chest x-ray earlier today FINDINGS: Cardiovascular: Diffuse aortic calcifications. Heart is normal size. No filling defects in the pulmonary arteries to suggest pulmonary emboli. Mediastinum/Nodes: No mediastinal, hilar, or axillary adenopathy. Lungs/Pleura: No confluent airspace opacities or effusions. Upper Abdomen: Imaging into the upper abdomen shows no acute findings. Musculoskeletal: Chest wall soft tissues are unremarkable. No acute bony abnormality. Review of the MIP images confirms the above findings. IMPRESSION: No evidence of pulmonary embolus. Prior CABG. No acute cardiopulmonary disease. Aortic Atherosclerosis (ICD10-I70.0). Electronically Signed   By: Charlett NoseKevin  Dover M.D.   On: 11/24/2017 21:50      SIGNIFICANT EVENTS  2/26  Admit with tachycardia  2/27  PCCM consulted  STUDIES CTA Chest 2/26 >> negative for PE, prior CABG, no acute cardiopulmonary disease  CULTURES   ANTIBIOTICS     ASSESSMENT / PLAN:  Discussion:  67 y/o F, former smoker, who was admitted with reports of tachycardia and cough.   Suspect recent URI with post viral cough syndrome (this may also have driven her tachycardia).  However, she does carry a significant smoking history that would lend to obstructive lung disease.  In addition, she has GERD and possible post nasal gtt.  CXR and CT are negative for infiltrate.  She does have some very small areas of ground glass opacities on CT which could be post viral or edema.     Cough  Recent URI  Suspected COPD  Post Nasal Drip  GERD   Plan: Tussionex BID for while inpatient for cough suppression  Continue PRN albuterol for discharge  Prednisone 40mg  PO 2/27, then 20 mg QD x5 days  Continue GERD Rx Follow intermittent CXR  Pulmonary hygiene - IS, mobilize Pulmonary follow up arranged as outpatient  Will need outpatient PFT's for COPD evaluation given extensive smoking history.     Merry ProudBrandi  Veleta Miners NP-C [facility] Pgr: [phone] or if no answer [phone] 11/25/2017, 2:16 PM

## 2017-11-25 NOTE — Progress Notes (Signed)
Progress Note  Patient Name: Crystal [name] Date of Encounter: 11/25/2017  Primary Cardiologist: Donato Schultz, MD   Subjective   Currently stable at rest. Pt complains of chronic dry cough x 4 weeks. Some mild exertional dyspnea and occasional wheezing. Also notes 10 lb weight gain over the course of 1-2 weeks with increased abdominal girth. She has had atypical left sided chest pain that is related to her cough.   Inpatient Medications    Scheduled Meds: . aspirin  81 mg Oral Daily  . atorvastatin  80 mg Oral Daily  . buPROPion  300 mg Oral Daily  . cilostazol  50 mg Oral QHS  . DULoxetine  60 mg Oral Daily  . isosorbide mononitrate  60 mg Oral Daily  . metoprolol succinate  100 mg Oral Daily  . pantoprazole  40 mg Oral QAC breakfast   Continuous Infusions:  PRN Meds: acetaminophen, guaiFENesin-dextromethorphan, ondansetron (ZOFRAN) IV   Vital Signs    Vitals:   11/25/17 0400 11/25/17 0430 11/25/17 0529 11/25/17 0830  BP: 129/85 115/60 (!) 146/85 (!) 141/65  Pulse: (!) 48 (!) 48 (!) 102 70  Resp: 19 18 20 18   Temp:   (!) 97.5 F (36.4 C) 99 F (37.2 C)  TempSrc:   Oral Oral  SpO2: 98% 98% 100% 99%  Weight:   164 lb (74.4 kg)   Height:   5' (1.524 m)     Intake/Output Summary (Last 24 hours) at 11/25/2017 1006 Last data filed at 11/25/2017 0500 Gross per 24 hour  Intake 2000 ml  Output -  Net 2000 ml   Filed Weights   11/24/17 1641 11/25/17 0529  Weight: 160 lb (72.6 kg) 164 lb (74.4 kg)    Telemetry    Sinus tach currently,  - Personally Reviewed  ECG    Sinus tach, ? ectopic atrial tachycardia with inverted P wave in lead I - Personally Reviewed  Physical Exam   GEN: No acute distress. Moderately obese  Neck: No JVD Cardiac: Reg rhythm, tachy rate, no murmurs, rubs, or gallops.  Respiratory: Clear to auscultation bilaterally. GI: Soft, nontender, non-distended  MS: No edema; No deformity. Neuro:  Nonfocal  Psych: Normal affect   Labs      Chemistry Recent Labs  Lab 11/24/17 1643 11/25/17 0328  NA 142 142  K 4.1 3.5  CL 109 113*  CO2 21* 18*  GLUCOSE 112* 136*  BUN 11 12  CREATININE 1.44* 1.49*  CALCIUM 9.0 8.0*  GFRNONAA 37* 35*  GFRAA 42* 41*  ANIONGAP 12 11     Hematology Recent Labs  Lab 11/24/17 1643  WBC 10.3  RBC 3.88  HGB 13.4  HCT 38.5  MCV 99.2  MCH 34.5*  MCHC 34.8  RDW 15.2  PLT 285    Cardiac Enzymes Recent Labs  Lab 11/25/17 0328  TROPONINI 0.03*    Recent Labs  Lab 11/24/17 1657  TROPIPOC 0.01     BNPNo results for input(s): BNP, PROBNP in the last 168 hours.   DDimer No results for input(s): DDIMER in the last 168 hours.   Radiology    Dg Chest 2 View  Result Date: 11/24/2017 CLINICAL DATA:  Productive cough EXAM: CHEST  2 VIEW COMPARISON:  12/09/2012 FINDINGS: Post sternotomy changes. No focal consolidation or effusion. Cardiomediastinal silhouette within normal limits. Aortic atherosclerosis. No pneumothorax. IMPRESSION: No active cardiopulmonary disease. Electronically Signed   By: Jasmine Pang M.D.   On: 11/24/2017 17:02   Ct Angio  Chest Pe W And/or Wo Contrast  Result Date: 11/24/2017 CLINICAL DATA:  Productive cough, shortness of breath, chest pain EXAM: CT ANGIOGRAPHY CHEST WITH CONTRAST TECHNIQUE: Multidetector CT imaging of the chest was performed using the standard protocol during bolus administration of intravenous contrast. Multiplanar CT image reconstructions and MIPs were obtained to evaluate the vascular anatomy. CONTRAST:  ISOVUE-370 IOPAMIDOL (ISOVUE-370) INJECTION 76% COMPARISON:  Chest x-ray earlier today FINDINGS: Cardiovascular: Diffuse aortic calcifications. Heart is normal size. No filling defects in the pulmonary arteries to suggest pulmonary emboli. Mediastinum/Nodes: No mediastinal, hilar, or axillary adenopathy. Lungs/Pleura: No confluent airspace opacities or effusions. Upper Abdomen: Imaging into the upper abdomen shows no acute findings.  Musculoskeletal: Chest wall soft tissues are unremarkable. No acute bony abnormality. Review of the MIP images confirms the above findings. IMPRESSION: No evidence of pulmonary embolus. Prior CABG. No acute cardiopulmonary disease. Aortic Atherosclerosis (ICD10-I70.0). Electronically Signed   By: Charlett Nose M.D.   On: 11/24/2017 21:50    Cardiac Studies   2D Echo 01/25/09 1. Left ventricle: The cavity size was normal. Systolic function was  normal. The estimated ejection fraction was in the range of 55%  to 65%. Wall motion was normal; there were no regional wall  motion abnormalities. 2. Atrial septum: A patent foramen ovale cannot be excluded. A shunt  cannot be excluded. Impressions:  - No cardiac source of embolism was identified, but cannot be ruled  out on the basis of this examination.  Cath 11/22/2003: FINAL IMPRESSION: 1. Atherosclerotic coronary vascular disease, single-vessel. 2. Occluded right internal mammary artery to the right coronary. 3. Degree of stenosis within the stented segment of the right coronary is uncertain at this point. 4. Intact left ventricular size and global systolic function. 5. No significant left coronary artery disease.  11/04/2013: LE Doppler Mildly reduced flow, right ABI. Overall no need for further intervention or evaluation. Continue with exercise. Overall reassuring.  01/25/16: Carotid Doppler Heterogeneous plaque, bilaterally. 1-39% bilateral ICA stenosis. Elevated velocities in the right subclavian artery. Normal left subclavian artery. Patent vertebral arteries with antegrade flow. Mild carotid plaque noted. Continue with current therapy.   Patient Profile     Crystal [name] is a 67 y.o. female with history of CAD with multiple PCI to RCA and s/p CABGx1 (RIMA to RCA), now known to be occluded, diabetes, hypertension, H/o TIA, small ASD, PVD and former tobacco user, who presented from her PCP office with  tachycardia, dyspnea and chronic dry cough x 1 month. Chest CT negative for PE. Initial EKG showed a regular narrow complex tachycardia at 140 with P waves at the midpoint of the RR interval. She was given 2 L IV fluid and 5 mg IV metoprolol and her rate slowed to 100-110.  Repeat EKG shows likely ectopic atrial tachycardia with inverted P wave in lead II and likely type I second-degree AV block after receiving metoprolol. Pt admitted by cardiology for further w/u and observation.    Assessment & Plan    1. Dry Cough: present x 1 month. Nonproductive. Pt notes some associated mild exertional dyspnea and occasional wheezing, however no associated fever, chills or nasal congestion to suggest URI. No improvement with OTC cough medications. CXR negative. No focal consolidation or effusion.  No active cardiopulmonary disease. She is a former smoker but quit ~10 years ago. Home meds reviewed. She is not on an ACE-I. She does not appear to be grossly volume overloaded on exam, but pt does endorse a 10 lb weight  gain over the course of 1-2 weeks. She notes increased abdominal girth and admits to eating a high salt diet (heavily seasons food w/ salt). Will check BNP and if elevated, will treat with diuretics. Will also obtain 2D echo. Doubt pericardial effusion, but will also need to check LVEF. Pt also has h/o acid reflux. ? If chronic cough is 2/2 GERD.    2. Tachycardia: Initial EKG showed a regular narrow complex tachycardia at 140 with P waves at the midpoint of the RR interval. She was given 2 L IV fluid and 5 mg IV metoprolol and her rate slowed to 100-110.  Repeat EKG shows likely ectopic atrial tachycardia with inverted P wave in lead II and likely type I second-degree AV block after receiving metoprolol. Currently sinus tach in the low 100s. ? possible afib on telemetry. Will have MD to review. TSH is WNL. K normal at 3.5. H/H normal. BMP suggest renal insuffiencey. SCr up to 1.4. Baseline is ~1.0-1.1. She  is currently on metoprolol succinate, 100 mg daily. Will continue for now. Will obtain 2D echo to assess for structural abnormalities and check LVEF. Last echo was in 2010. Continue to monitor on tele.   3. CAD:  multiple PCIs to RCA and s/p CABGx1 (RIMA to RCA), now known to be occluded. She denies ischemic chest pain. She has had atypical chest discomfort, related to cough. Continue medical therapy.    4. ? HF: will continue w/u. Will check BNP and 2D Echo.   5. Renal Insuffiencey: BMP suggest renal insuffiencey. SCr up to 1.4. Baseline is ~1.0-1.1. Will monitor. If BNP is normal and not suggestive of CHF, then we can hydrate with IVFs. She does not appear to be on an ACE or ARB. No diuretics prior to admit.   6. HTN: mildly elevated. Imdur and metoprolol just given. Monitor response. Can adjust meds accordingly.   7. HLD: lipid panel recently checked by PCP 09/2017. LDL was 91 mg/dL and not at recommended goal given CAD history. LDL goal < 70 mg. She is on Lipitor 80 mg. Recommend adding Zetia to Lipitor and recheck FLP and HFTs in 6 weeks. If not at goal, then would refer to lipid clinic for PCSK9 inhibitor therapy.   8. DM: followed by PCP. On Metformin with good control. Recent Hgb A1c at PCP office was 5.6.   9. PVD: h/o patch angioplasty to right femoral artery 01/1998. Heterogeneous plaque, bilaterally involving the carotid arteries. 1-39% bilateral ICA stenosis noted on doppler study in 2017. Asymptomatic. On ASA, Pletal and statin therapy.      For questions or updates, please contact [facility] Please consult [email or web address] for contact info under Cardiology/STEMI.      Signed, Robbie LisBrittainy Denna Fryberger, PA-C  11/25/2017, 10:06 AM

## 2017-11-25 NOTE — Progress Notes (Signed)
  Echocardiogram 2D Echocardiogram has been performed.  Crystal [name] 11/25/2017, 2:17 PM

## 2017-11-25 NOTE — H&P (Signed)
Cardiology History & Physical    Patient ID: Crystal Crystal [name] MRN: [id number], DOB: [birth year] Date of Encounter: 11/25/2017, 12:46 AM Primary Physician: Lonie Peak, PA-C Primary Cardiologist: Anne Fu  Chief Complaint: cough, shortness of breath, chest pain Reason for Admission: atrial tachycardia, chest pain Requesting MD: Adela Lank  HPI: Crystal Crystal [name] is a 67 y.o. female with history of CAD with multiple PCI to RCA and Crystal [name]/p CABGx1 (RIMA to RCA), now known to be occluded, diabetes, hypertension who presents from her PCP office with tachycardia.  Patient was seen by her PCP today for evaluation of dry cough of one-month duration.  Was noted to be tachycardic to 140 and sent to the ED for further evaluation.  Initial EKG showed a regular narrow complex tachycardia at 140 with P waves at the midpoint of the RR interval.  Vitals otherwise stable.  She was given 2 L IV fluid and 5 mg IV metoprolol and her rate slowed to 100-110.  Repeat EKG shows likely ectopic atrial tachycardia with inverted P wave in lead II and likely type I second-degree AV block after receiving metoprolol.  Heart rate 104.  CT PE protocol was negative for PE or acute cardiopulmonary process.  Patient denies any acute symptoms related to her tachycardia today.  She denies palpitations or chest discomfort and says she felt the same today as she did yesterday.  Her main complaints are a dry cough of one-month duration, shortness of breath, and intermittent chest discomfort.  Shortness of breath has been present about 1 month and is getting worse.  She states she is short of breath just walking to the next room in her house.  She also is having more frequent chest pain which she says is not exertional.  It is occurring 1-2 times per week.  She denies fever chills or URI symptoms.   Past Medical History:  Diagnosis Date  . Angina pectoris (HCC)   . Anxiety   . ASCVD (arteriosclerotic cardiovascular disease)    single vessel  .  ASD (atrial septal defect)    Small ASD with insignificant shunt  . Back pain, chronic   . Chronic ischemic heart disease   . Claudication (HCC)   . Coronary atherosclerosis of native coronary artery   . Depression   . Diabetes mellitus without complication (HCC)   . Edema   . GERD (gastroesophageal reflux disease)   . Hypercholesteremia   . Hypertension   . MRSA carrier    Chronic  . Occlusion and stenosis of carotid artery without mention of cerebral infarction    Last carotid doppler was in 2006. Patch angioplasty right femoral artery 01/1998, patenet 09/2005 ([facility])  . Osteoarthritis   . Osteopenia   . Other and unspecified angina pectoris   . PAD (peripheral artery disease) (HCC)   . Postsurgical aortocoronary bypass status   . Postsurgical percutaneous transluminal coronary angioplasty status   . Pseudodementia   . Right carotid bruit    Negative doppler 09/2002  . SOB (shortness of breath)    ECHO 04/13/12 EF estimated at 55-60%     Surgical History:  Past Surgical History:  Procedure Laterality Date  . CORONARY ANGIOPLASTY WITH STENT PLACEMENT  01/15/98   PTCA stent RCA, PTCA diagonal  . CORONARY ANGIOPLASTY WITH STENT PLACEMENT  08/17/00   Cutting balloon ostial RCA  . CORONARY ANGIOPLASTY WITH STENT PLACEMENT  01/19/01   Unsuccessful attempted PTCA ostial RCA, stent protruding into root  . CORONARY ANGIOPLASTY WITH STENT PLACEMENT  01/20/01   RIMA-RCA, occluded cath 10/2003  . CORONARY ANGIOPLASTY WITH STENT PLACEMENT  02/02/04   Successful PTCA/drug eluding stent ostial RCA, [facility]  . TUBAL LIGATION       Home Meds: Prior to Admission medications   Medication Sig Start Date End Date Taking? Authorizing Provider  aspirin 81 MG tablet Take 81 mg by mouth daily.   Yes [provider]  atorvastatin (LIPITOR) 80 MG tablet Take 80 mg by mouth daily.   Yes [provider]  buPROPion (WELLBUTRIN XL) 300 MG 24 hr tablet Take 300 mg by mouth daily.  01/02/16  Yes [provider]  calcium-vitamin D (OSCAL WITH D) 500-200 MG-UNIT per tablet Take 1 tablet by mouth daily. 500 mg by mouth daily   Yes [provider]  cholecalciferol (VITAMIN D) 400 UNITS TABS tablet Take 1,000 Units by mouth daily.   Yes [provider]  cilostazol (PLETAL) 50 MG tablet Take 50 mg by mouth at bedtime.    Yes [provider]  clonazePAM (KLONOPIN) 1 MG tablet Take 1 mg by mouth daily.   Yes [provider]  DULoxetine (CYMBALTA) 60 MG capsule Take 1 capsule (60 mg total) by mouth daily. 12/12/12  Yes Laza, Sorin C, MD  esomeprazole (NEXIUM) 40 MG capsule Take 40 mg by mouth daily before breakfast.   Yes [provider]  isosorbide mononitrate (IMDUR) 60 MG 24 hr tablet Take 1 tablet (60 mg total) by mouth daily. 09/30/16  Yes Jake Bathe, MD  meclizine (ANTIVERT) 25 MG tablet Take 25 mg by mouth daily. 01/08/16  Yes [provider]  metFORMIN (GLUCOPHAGE) 500 MG tablet Take 500 mg by mouth at bedtime.    Yes [provider]  metoprolol succinate (TOPROL-XL) 50 MG 24 hr tablet Take two (2) tablet (100 mg) by mouth daily. 08/19/17  Yes Jake Bathe, MD  nabumetone (RELAFEN) 500 MG tablet Take 1,000 mg by mouth at bedtime.    Yes [provider]  omeprazole (PRILOSEC) 40 MG capsule Take 40 mg by mouth daily. 01/14/16  Yes [provider]  vitamin B-12 (CYANOCOBALAMIN) 1000 MCG tablet Take 2,000 mcg by mouth daily.   Yes [provider]  albuterol (PROVENTIL HFA;VENTOLIN HFA) 108 (90 BASE) MCG/ACT inhaler Inhale 2 puffs into the lungs every 6 (six) hours as needed for wheezing.     [provider]  EUCRISA 2 % OINT Apply 1 application topically daily. 10/28/17   [provider]  nitroGLYCERIN (NITROSTAT) 0.4 MG SL tablet Place 1 tablet (0.4 mg total) every 5 (five) minutes as needed under the tongue for chest pain. x3 doses as needed for chest pain 08/04/17    Jake Bathe, MD    Allergies:  Allergies  Allergen Reactions  . Bactrim [Sulfamethoxazole-Trimethoprim] Nausea And Vomiting  . Penicillins     Unknown   . Sulfa Antibiotics     unknown    Social History   Socioeconomic History  . Marital status: Divorced    Spouse name: Not on file  . Number of children: Not on file  . Years of education: Not on file  . Highest education level: Not on file  Social Needs  . Financial resource strain: Not on file  . Food insecurity - worry: Not on file  . Food insecurity - inability: Not on file  . Transportation needs - medical: Not on file  . Transportation needs - non-medical: Not on file  Occupational History  .  Not on file  Tobacco Use  . Smoking status: Former Smoker    Types: Cigarettes    Last attempt to quit: 09/29/2010    Years since quitting: 7.1  . Smokeless tobacco: Never Used  Substance and Sexual Activity  . Alcohol use: No  . Drug use: No  . Sexual activity: Not on file  Other Topics Concern  . Not on file  Social History Narrative  . Not on file     Family History  Problem Relation Age of Onset  . Heart attack Mother     Review of Systems: General: negative for chills, fever, night sweats or weight changes.  Cardiovascular: positive for chest pain, shortness of breath, cough.  Negative for edema, orthopnea, palpitations, paroxysmal nocturnal dyspnea, shortness of breath or dyspnea on exertion Dermatological: negative for rash Respiratory: negative for cough or wheezing Urologic: negative for hematuria Abdominal: negative for nausea, vomiting, diarrhea, bright red blood per rectum, melena, or hematemesis Neurologic: negative for visual changes, syncope, or dizziness All other systems reviewed and are otherwise negative except as noted above.  Labs:   Lab Results  Component Value Date   WBC 10.3 11/24/2017   HGB 13.4 11/24/2017   HCT 38.5 11/24/2017   MCV 99.2 11/24/2017   PLT 285 11/24/2017      Recent Labs  Lab 11/24/17 1643  NA 142  K 4.1  CL 109  CO2 21*  BUN 11  CREATININE 1.44*  CALCIUM 9.0  GLUCOSE 112*   No results for input(Crystal [name]): CKTOTAL, CKMB, TROPONINI in the last 72 hours. Lab Results  Component Value Date   CHOL 153 01/23/2015   HDL 32.60 (L) 01/23/2015   LDLCALC 81 01/23/2015   TRIG 197.0 (H) 01/23/2015   Lab Results  Component Value Date   DDIMER (H) 04/26/2010    0.59        AT THE INHOUSE ESTABLISHED CUTOFF VALUE OF 0.48 ug/mL FEU, THIS ASSAY HAS BEEN DOCUMENTED IN THE LITERATURE TO HAVE A SENSITIVITY AND NEGATIVE PREDICTIVE VALUE OF AT LEAST 98 TO 99%.  THE TEST RESULT SHOULD BE CORRELATED WITH AN ASSESSMENT OF THE CLINICAL PROBABILITY OF DVT / VTE.    Radiology/Studies:  Dg Chest 2 View  Result Date: 11/24/2017 CLINICAL DATA:  Productive cough EXAM: CHEST  2 VIEW COMPARISON:  12/09/2012 FINDINGS: Post sternotomy changes. No focal consolidation or effusion. Cardiomediastinal silhouette within normal limits. Aortic atherosclerosis. No pneumothorax. IMPRESSION: No active cardiopulmonary disease. Electronically Signed   By: Jasmine Pang M.D.   On: 11/24/2017 17:02   Ct Angio Chest Pe W And/or Wo Contrast  Result Date: 11/24/2017 CLINICAL DATA:  Productive cough, shortness of breath, chest pain EXAM: CT ANGIOGRAPHY CHEST WITH CONTRAST TECHNIQUE: Multidetector CT imaging of the chest was performed using the standard protocol during bolus administration of intravenous contrast. Multiplanar CT image reconstructions and MIPs were obtained to evaluate the vascular anatomy. CONTRAST:  ISOVUE-370 IOPAMIDOL (ISOVUE-370) INJECTION 76% COMPARISON:  Chest x-ray earlier today FINDINGS: Cardiovascular: Diffuse aortic calcifications. Heart is normal size. No filling defects in the pulmonary arteries to suggest pulmonary emboli. Mediastinum/Nodes: No mediastinal, hilar, or axillary adenopathy. Lungs/Pleura: No confluent airspace opacities or effusions. Upper  Abdomen: Imaging into the upper abdomen shows no acute findings. Musculoskeletal: Chest wall soft tissues are unremarkable. No acute bony abnormality. Review of the MIP images confirms the above findings. IMPRESSION: No evidence of pulmonary embolus. Prior CABG. No acute cardiopulmonary disease. Aortic Atherosclerosis (ICD10-I70.0). Electronically Signed   By: Charlett Nose  M.D.   On: 11/24/2017 21:50   Wt Readings from Last 3 Encounters:  11/24/17 72.6 kg (160 lb)  06/16/17 70.3 kg (155 lb)  01/29/17 65 kg (143 lb 3.2 oz)    EKG: see HPI.  Physical Exam: Blood pressure 108/75, pulse (!) 102, temperature 98.3 F (36.8 C), temperature source Oral, resp. rate 16, height 5' (1.524 m), weight 72.6 kg (160 lb), SpO2 99 %. Body mass index is 31.25 kg/m. General: Well developed, well nourished, in no acute distress. Head: Normocephalic, atraumatic, sclera non-icteric, no xanthomas, nares are without discharge.  Neck: Negative for carotid bruits. JVD not elevated. Lungs: Clear bilaterally to auscultation without wheezes, rales, or rhonchi. Breathing is unlabored. Heart: RRR with S1 S2. No murmurs, rubs, or gallops appreciated. Abdomen: Soft, non-tender, non-distended with normoactive bowel sounds. No hepatomegaly. No rebound/guarding. No obvious abdominal masses. Msk:  Strength and tone appear normal for age. Extremities: No clubbing or cyanosis. No edema.  Distal pedal pulses are 2+ and equal bilaterally. Neuro: Alert and oriented X 3. No focal deficit. No facial asymmetry. Moves all extremities spontaneously. Psych:  Responds to questions appropriately with a normal affect.    Assessment and Plan   1. Ectopic atrial tachycardia Rhythm appears to be an ectopic atrial tachycardia with a negative P wave in lead II.  After receiving metoprolol there was some slowing of her rhythm likely due to slowing of conduction in the AV node and Wenckebach phenomenon.  Unclear what precipitated this.  She has  had a cough but does not have any symptoms of an acute URI or infection.  She seems to have been asymptomatic despite having a heart rate of 140.  Will monitor rhythm on telemetry, continue rate control. -Continue home metoprolol 100 XL daily -Monitor on telemetry  2. Chest pain, dyspnea Patient reports intermittent chest pain and severe dyspnea at home.  She states that she gets short of breath just walking to the next room at home.  Her CT chest is unremarkable.  She does have a history of asthma although unclear if this is contributing.  Her chest discomfort sounds atypical and is not clearly linked to exertion.  She may eventually need some type of ischemic evaluation though not necessarily as an inpatient. -Cycle troponin -Can consider in patient versus outpatient stress test  3. CAD Continue home aspirin atorvastatin Imdur and metoprolol.  4.  PAD Continue cilostazol    Severity of Illness: The appropriate patient status for this patient is OBSERVATION. Observation status is judged to be reasonable and necessary in order to provide the required intensity of service to ensure the patient'Crystal [name] safety. The patient'Crystal [name] presenting symptoms, physical exam findings, and initial radiographic and laboratory data in the context of their medical condition is felt to place them at decreased risk for further clinical deterioration. Furthermore, it is anticipated that the patient will be medically stable for discharge from the hospital within 2 midnights of admission. The following factors support the patient status of observation.   " The patient'Crystal [name] presenting symptoms include dyspnea, chest pain. " The physical exam findings include tachycardia. " The initial radiographic and laboratory data are abnormal.     For questions or updates, please contact [facility] Please consult [email or web address] for contact info under Cardiology/STEMI.  Allen DerrySigned, Crystal Behe S, MD 11/25/2017, 12:46 AM

## 2017-11-25 NOTE — ED Notes (Signed)
Attempted report x1. RN unavailable @ this time. Callback # left. 

## 2017-11-25 NOTE — Progress Notes (Signed)
   11/25/17 0529  Vitals  Temp (!) 97.5 F (36.4 C)  Temp Source Oral  BP (!) 146/85  BP Location Left Arm  BP Method Automatic  Patient Position (if appropriate) Lying  Pulse Rate (!) 102  Resp 20  Oxygen Therapy  SpO2 100 %  O2 Device Room Air  Height and Weight  Height 5' (1.524 m)  Weight 74.4 kg (164 lb)  Type of Scale Used Standing (scale B)  Type of Weight Actual  BSA (Calculated - sq m) 1.77 sq meters  BMI (Calculated) 32.03  Weight in (lb) to have BMI = 25 127.7  Admitted pt to rm 3E21 from ED, pt alert and oriented, denied pain at this time, oriented to room, call bell placed within reach, educated on Pt's safety plan with pt's understanding. Placed on cardiac monitor, CCMD made aware.

## 2017-11-25 NOTE — Progress Notes (Addendum)
CCMD called that pt HR 160s sustained upon entering the room patient was finishing her bath after sitting heart rate come down to baseline. Continuous cough Cardiologist Hity aware and PA simmons will round today.

## 2017-11-26 DIAGNOSIS — J449 Chronic obstructive pulmonary disease, unspecified: Secondary | ICD-10-CM

## 2017-11-26 DIAGNOSIS — I471 Supraventricular tachycardia: Secondary | ICD-10-CM | POA: Diagnosis not present

## 2017-11-26 DIAGNOSIS — I498 Other specified cardiac arrhythmias: Secondary | ICD-10-CM | POA: Diagnosis not present

## 2017-11-26 DIAGNOSIS — K219 Gastro-esophageal reflux disease without esophagitis: Secondary | ICD-10-CM | POA: Diagnosis not present

## 2017-11-26 DIAGNOSIS — R052 Subacute cough: Secondary | ICD-10-CM

## 2017-11-26 DIAGNOSIS — N179 Acute kidney failure, unspecified: Secondary | ICD-10-CM

## 2017-11-26 DIAGNOSIS — R05 Cough: Secondary | ICD-10-CM | POA: Diagnosis not present

## 2017-11-26 DIAGNOSIS — N189 Chronic kidney disease, unspecified: Secondary | ICD-10-CM

## 2017-11-26 LAB — GLUCOSE, CAPILLARY: Glucose-Capillary: 220 mg/dL — ABNORMAL HIGH (ref 65–99)

## 2017-11-26 LAB — BASIC METABOLIC PANEL
Anion gap: 10 (ref 5–15)
BUN: 16 mg/dL (ref 6–20)
CO2: 22 mmol/L (ref 22–32)
Calcium: 8.8 mg/dL — ABNORMAL LOW (ref 8.9–10.3)
Chloride: 107 mmol/L (ref 101–111)
Creatinine, Ser: 1.36 mg/dL — ABNORMAL HIGH (ref 0.44–1.00)
GFR calc Af Amer: 46 mL/min — ABNORMAL LOW (ref >60)
GFR calc non Af Amer: 39 mL/min — ABNORMAL LOW (ref >60)
Glucose, Bld: 179 mg/dL — ABNORMAL HIGH (ref 65–99)
Potassium: 4.6 mmol/L (ref 3.5–5.1)
Sodium: 139 mmol/L (ref 135–145)

## 2017-11-26 MED ORDER — PANTOPRAZOLE SODIUM 40 MG PO TBEC: 40.0000 mg | DELAYED_RELEASE_TABLET | Freq: Two times a day (BID) | ORAL | 1 refills | Status: DC

## 2017-11-26 MED ORDER — BENZONATATE 100 MG PO CAPS
100.0000 mg | ORAL_CAPSULE | Freq: Once | ORAL | Status: AC
  Administered 2017-11-26: 100 mg via ORAL
  Filled 2017-11-26: qty 1

## 2017-11-26 MED ORDER — HYDROCOD POLST-CPM POLST ER 10-8 MG/5ML PO SUER: 5.0000 mL | Freq: Two times a day (BID) | ORAL | 0 refills | Status: DC

## 2017-11-26 MED ORDER — PREDNISONE 20 MG PO TABS: ORAL_TABLET | ORAL | 0 refills | Status: DC

## 2017-11-26 NOTE — Progress Notes (Addendum)
Electrophysiology Rounding Note  Patient Name: Crystal GlasgowGlenda P Westfall Date of Encounter: 11/26/2017  Primary Cardiologist: Anne FuSkains Electrophysiologist: Elberta Fortisamnitz   Subjective   The patient is doing well today.  At this time, the patient denies chest pain, shortness of breath, or any new concerns. She feels significantly improved since yesterday  Inpatient Medications    Scheduled Meds: . aspirin  81 mg Oral Daily  . atorvastatin  80 mg Oral Daily  . buPROPion  300 mg Oral Daily  . chlorpheniramine-HYDROcodone  5 mL Oral Q12H  . cilostazol  50 mg Oral QHS  . DULoxetine  60 mg Oral Daily  . isosorbide mononitrate  60 mg Oral Daily  . metoprolol succinate  100 mg Oral Daily  . pantoprazole  40 mg Oral QAC breakfast  . predniSONE  20 mg Oral Daily   Continuous Infusions:  PRN Meds: acetaminophen, guaiFENesin-dextromethorphan, ondansetron (ZOFRAN) IV   Vital Signs    Vitals:   11/25/17 1100 11/25/17 1500 11/25/17 2018 11/26/17 0500  BP: (!) 134/55 137/64 (!) 148/59 (!) 146/52  Pulse: (!) 54 64 66 69  Resp: 20 18 18 18   Temp: 98.5 F (36.9 C) (!) 97.5 F (36.4 C) (!) 97.5 F (36.4 C) 98.1 F (36.7 C)  TempSrc: Oral Oral Oral Oral  SpO2: 98% 99% 98% 98%  Weight:    161 lb 3.2 oz (73.1 kg)  Height:        Intake/Output Summary (Last 24 hours) at 11/26/2017 0746 Last data filed at 11/26/2017 0601 Gross per 24 hour  Intake 480 ml  Output 900 ml  Net -420 ml   Filed Weights   11/24/17 1641 11/25/17 0529 11/26/17 0500  Weight: 160 lb (72.6 kg) 164 lb (74.4 kg) 161 lb 3.2 oz (73.1 kg)    Physical Exam    GEN- The patient is well appearing, alert and oriented x 3 today.   Head- normocephalic, atraumatic Eyes-  Sclera clear, conjunctiva pink Ears- hearing intact Oropharynx- clear Neck- supple Lungs- Clear to ausculation bilaterally, normal work of breathing Heart- Regular rate and rhythm, no murmurs, rubs or gallops GI- soft, NT, ND, + BS Extremities- no clubbing,  cyanosis, or edema Skin- no rash or lesion Psych- euthymic mood, full affect Neuro- strength and sensation are intact  Labs    CBC Recent Labs    11/24/17 1643  WBC 10.3  HGB 13.4  HCT 38.5  MCV 99.2  PLT 285   Basic Metabolic Panel Recent Labs    16/06/9601/27/19 0328 11/26/17 0459  NA 142 139  K 3.5 4.6  CL 113* 107  CO2 18* 22  GLUCOSE 136* 179*  BUN 12 16  CREATININE 1.49* 1.36*  CALCIUM 8.0* 8.8*   Cardiac Enzymes Recent Labs    11/25/17 0328  TROPONINI 0.03*   Thyroid Function Tests Recent Labs    11/25/17 0021  TSH 0.971    Telemetry    Sinus rhythm (personally reviewed)  Radiology    Dg Chest 2 View  Result Date: 11/24/2017 CLINICAL DATA:  Productive cough EXAM: CHEST  2 VIEW COMPARISON:  12/09/2012 FINDINGS: Post sternotomy changes. No focal consolidation or effusion. Cardiomediastinal silhouette within normal limits. Aortic atherosclerosis. No pneumothorax. IMPRESSION: No active cardiopulmonary disease. Electronically Signed   By: Jasmine PangKim  Fujinaga M.D.   On: 11/24/2017 17:02   Ct Angio Chest Pe W And/or Wo Contrast  Result Date: 11/24/2017 CLINICAL DATA:  Productive cough, shortness of breath, chest pain EXAM: CT ANGIOGRAPHY CHEST WITH CONTRAST TECHNIQUE: Multidetector  CT imaging of the chest was performed using the standard protocol during bolus administration of intravenous contrast. Multiplanar CT image reconstructions and MIPs were obtained to evaluate the vascular anatomy. CONTRAST:  ISOVUE-370 IOPAMIDOL (ISOVUE-370) INJECTION 76% COMPARISON:  Chest x-ray earlier today FINDINGS: Cardiovascular: Diffuse aortic calcifications. Heart is normal size. No filling defects in the pulmonary arteries to suggest pulmonary emboli. Mediastinum/Nodes: No mediastinal, hilar, or axillary adenopathy. Lungs/Pleura: No confluent airspace opacities or effusions. Upper Abdomen: Imaging into the upper abdomen shows no acute findings. Musculoskeletal: Chest wall soft  tissues are unremarkable. No acute bony abnormality. Review of the MIP images confirms the above findings. IMPRESSION: No evidence of pulmonary embolus. Prior CABG. No acute cardiopulmonary disease. Aortic Atherosclerosis (ICD10-I70.0). Electronically Signed   By: Charlett Nose M.D.   On: 11/24/2017 21:50    Assessment & Plan    1.  Atrial tachycardia Pt with newly diagnosed atrial tachycardia this admission Echo reviewed - normal EF, normal LA She has maintained SR since yesterday with control of pulmonary symptoms Would continue current therapy for now If she has recurrent AT, could consider Multaq or Sotalol  Electrophysiology team to see as needed while here. Please call with questions. Follow up with Dr Anne Fu in office, Dr Elberta Fortis can see for recurrent atrial arrhythmias   Signed, Gypsy Balsam, NP  11/26/2017, 7:46 AM   I have seen and examined this patient with Gypsy Balsam.  Agree with above, note added to reflect my findings.  On exam, RRR, no murmurs, lungs clear. Has been in sinus rhythm overnight. It is possible that her lung issues have contributed to her atrial tachycardia. As her lung issues have been improving, and she is in sinus rhythm, would plan to hold off on therapy for arrhythmias. If she has further arrhythmias, can follow up in EP clinic.    Santi Troung M. Aldridge Krzyzanowski MD 11/26/2017 8:11 AM

## 2017-11-26 NOTE — Discharge Summary (Signed)
Discharge Summary    Patient ID: Crystal [name],  MRN: [id number], DOB/AGE: 1951-04-10 67 y.o.  Admit date: 11/24/2017 Discharge date: 11/26/2017  Primary Care Provider: Lonie Peak Primary Cardiologist: Donato Schultz, MD  Discharge Diagnoses    Active Problems:   Atrial tachycardia [facility])   Subacute cough   Chronic obstructive pulmonary disease (HCC)   Gastroesophageal reflux disease   AKI (acute kidney injury) (HCC)   Sinus arrhythmia   Allergies Allergies  Allergen Reactions  . Bactrim [Sulfamethoxazole-Trimethoprim] Nausea And Vomiting  . Penicillins     Unknown   . Sulfa Antibiotics     unknown    Diagnostic Studies/Procedures    2D Echo 11/25/17 Study Conclusions  - Left ventricle: The cavity size was normal. Systolic function was normal. Wall motion was normal; there were no regional wall motion abnormalities. Features are consistent with a pseudonormal left ventricular filling pattern, with concomitant abnormal relaxation and increased filling pressure (grade 2 diastolic dysfunction). Doppler parameters are consistent with indeterminate ventricular filling pressure. - Aortic valve: There was mild regurgitation. - Mitral valve: Transvalvular velocity was within the normal range. There was no evidence for stenosis. There was trivial regurgitation. Valve area by pressure half-time: 1.58 cm^2. - Left atrium: The atrium was mildly dilated. - Right ventricle: The cavity size was normal. Wall thickness was normal. Systolic function was normal. - Atrial septum: No defect or patent foramen ovale was identified. - Tricuspid valve: There was mild regurgitation. - Pulmonary arteries: Systolic pressure was within the normal range. PA peak pressure: 23 mm Hg (S). Left atrium: The atrium was mildly dilated.     History of Present Illness     Crystal P Longis a 67 y.o.femalewith history ofCAD with multiple PCI to RCA and s/p CABGx1 (RIMA  to RCA), now known to be occluded,diabetes, hypertension, H/o TIA, small ASD, PVD and former tobacco user,who presentedfrom her PCP office with tachycardia, dyspnea and chronic dry cough x 1 month. Chest CT negative for PE. Pt also found to be in atrial tachycardia with rates in the 140s. Pt was admitted under general cardiology for further management/ work-up.   Hospital Course     Pt was admitted to telemetry for monitoring. Her atrial tach resolved spontaneously. EP was consulted for recommendations, as outlined below. Pulmonology was also consulted to assist with w/u given her persistent dry cough. See management of each hospital medical problem below.   1. Atrial Tachycardia: newly diagnosed this admission. Echo shows normal LVEF. RA normal in size. LA mildly dilated. Pt evaluated by EP. It is felt that her atrial tach may be related to underlying lung issues. She is back in NSR and remained stable overnight w/o any recurrent arrhthymias. Per EP, " As her lung issues have been improving, and she is in sinus rhythm, would plan to hold off on therapy for arrhythmias. If she has further arrhythmias, can follow up in EP clinic". AAD therapy can be considered if recurrence.We appreciate EP's assistance.   2. Diastolic HF: Echo shows G2DD. Normal LVEF. BNP elevated at 710.6. Pt was given a dose of IV Lasix, 40 mg. X 1. Volume is stable. No dyspnea. SCr improved from 1.49>>1.36. K stable at 4.6. Pt encouraged to reduce sodium intake at home. Continue BB therapy with metoprolol.   3. Chronic Dry Cough: CXR negative for acute process. No ACE-I. + history of tobacco use (quit 10 years ago), h/o GERD and asthma. Chest CT showed very small areas of ground glass  opacities on CT which could be post viral or edema (BNP was 700 and IV Lasix given). Pt evaluated by pulmonology 11/25/17. DD includes possible GERD (PPI increased, Protonix 40 mg BID), vs COPD (PFTs to be completed as outpatient, prednisone  prescribed 20 mg PO x5 days then 10 mg PO x5 days then d/c, Rx for Tussinex also given). Outpatient pulmonary f/u will be arranged. We appreciate PCCM's assistance.     4. CAD: multiple PCIsto RCA and s/p CABGx1 (RIMA to RCA), now known to be occluded. She denies ischemic chest pain. She has had atypical chest discomfort, related to cough. EF normal by echo. No WMAs. Continue medical therapy.   5. Renal Insuffiencey: SCr improving. Scr 1.49 on admit (baseline ~1.0-1.1). SCr  Improved to 1.36 after dose of IV Lasix.  6. HTN: BP remained stable. Home regimen was continued.   7. HLD: lipid panel recently checked by PCP 09/2017. LDL was 91 mg/dL and not at recommended goal given CAD history. LDL goal <70 mg. She is on Lipitor 80 mg. Recommend adding Zetia to Lipitor and recheck FLP and HFTs in 6 weeks. If not at goal, then would refer to lipid clinic for PCSK9 inhibitor therapy.   8. DM: followed by PCP. On Metformin with good control. Recent Hgb A1c at PCP office was 5.6.  9. PVD: h/o patch angioplastytoright femoral artery 01/1998.Heterogeneous plaque, bilaterallyinvolving the carotid arteries. 1-39% bilateral ICA stenosisnoted on doppler study in 2017. Asymptomatic. On ASA, Pletal and statin therapy.  10. GERD: may be contributing to chronic dry cough. Protonix increased to BID dosing. F/u with PCP.   Pt was seen and examined by Dr. Rennis GoldenHilty on 11/26/17, who determined she was stable for discharge home. Rx for predispose with dosing/taper instructions was provided, along w/ Rx for Tussinex. Pt also instructed to increase Protonix to 40 mg BID, as recommended by pulmonology. Post hospital f/u will be arranged in our office. Pulmonology has arranged outpatient f/u in their office and pt will be set up to have PFTs completed.    Consultants:  1) Electrophysiology 2) Pulmonology     Discharge Vitals Blood pressure (!) 153/50, pulse 69, temperature 98.2 F (36.8 C), temperature source  Oral, resp. rate 20, height 5' (1.524 m), weight 161 lb 3.2 oz (73.1 kg), SpO2 98 %.  Filed Weights   11/24/17 1641 11/25/17 0529 11/26/17 0500  Weight: 160 lb (72.6 kg) 164 lb (74.4 kg) 161 lb 3.2 oz (73.1 kg)    Labs & Radiologic Studies    CBC Recent Labs    11/24/17 1643  WBC 10.3  HGB 13.4  HCT 38.5  MCV 99.2  PLT 285   Basic Metabolic Panel Recent Labs    82/95/6202/27/19 0328 11/26/17 0459  NA 142 139  K 3.5 4.6  CL 113* 107  CO2 18* 22  GLUCOSE 136* 179*  BUN 12 16  CREATININE 1.49* 1.36*  CALCIUM 8.0* 8.8*   Liver Function Tests No results for input(s): AST, ALT, ALKPHOS, BILITOT, PROT, ALBUMIN in the last 72 hours. No results for input(s): LIPASE, AMYLASE in the last 72 hours. Cardiac Enzymes Recent Labs    11/25/17 0328  TROPONINI 0.03*   BNP Invalid input(s): POCBNP D-Dimer No results for input(s): DDIMER in the last 72 hours. Hemoglobin A1C No results for input(s): HGBA1C in the last 72 hours. Fasting Lipid Panel No results for input(s): CHOL, HDL, LDLCALC, TRIG, CHOLHDL, LDLDIRECT in the last 72 hours. Thyroid Function Tests Recent Labs  11/25/17 0021  TSH 0.971   _____________  Dg Chest 2 View  Result Date: 11/24/2017 CLINICAL DATA:  Productive cough EXAM: CHEST  2 VIEW COMPARISON:  12/09/2012 FINDINGS: Post sternotomy changes. No focal consolidation or effusion. Cardiomediastinal silhouette within normal limits. Aortic atherosclerosis. No pneumothorax. IMPRESSION: No active cardiopulmonary disease. Electronically Signed   By: Jasmine Pang M.D.   On: 11/24/2017 17:02   Ct Angio Chest Pe W And/or Wo Contrast  Result Date: 11/24/2017 CLINICAL DATA:  Productive cough, shortness of breath, chest pain EXAM: CT ANGIOGRAPHY CHEST WITH CONTRAST TECHNIQUE: Multidetector CT imaging of the chest was performed using the standard protocol during bolus administration of intravenous contrast. Multiplanar CT image reconstructions and MIPs were obtained to  evaluate the vascular anatomy. CONTRAST:  ISOVUE-370 IOPAMIDOL (ISOVUE-370) INJECTION 76% COMPARISON:  Chest x-ray earlier today FINDINGS: Cardiovascular: Diffuse aortic calcifications. Heart is normal size. No filling defects in the pulmonary arteries to suggest pulmonary emboli. Mediastinum/Nodes: No mediastinal, hilar, or axillary adenopathy. Lungs/Pleura: No confluent airspace opacities or effusions. Upper Abdomen: Imaging into the upper abdomen shows no acute findings. Musculoskeletal: Chest wall soft tissues are unremarkable. No acute bony abnormality. Review of the MIP images confirms the above findings. IMPRESSION: No evidence of pulmonary embolus. Prior CABG. No acute cardiopulmonary disease. Aortic Atherosclerosis (ICD10-I70.0). Electronically Signed   By: Charlett Nose M.D.   On: 11/24/2017 21:50   Disposition   Pt is being discharged home today in good condition.  Follow-up Plans & Appointments    Follow-up Information    Bevelyn Ngo, NP Follow up on 12/02/2017.   Specialty:  Pulmonary Disease Why:  Follow up appointment at 3PM Contact information: [address] [place] [address] [phone]        Jake Bathe, MD Follow up.   Specialty:  Cardiology Why:  our office will call you with a hospital follow-up appointment  Contact information: [address] [place] [address] [phone]          Discharge Instructions    Diet - low sodium heart healthy   Complete by:  As directed    Increase activity slowly   Complete by:  As directed       Discharge Medications   Allergies as of 11/26/2017      Reactions   Bactrim [sulfamethoxazole-trimethoprim] Nausea And Vomiting   Penicillins    Unknown   Sulfa Antibiotics    unknown      Medication List    STOP taking these medications   esomeprazole 40 MG capsule Commonly known as:  NEXIUM Replaced by:  pantoprazole 40 MG tablet   omeprazole 40 MG capsule Commonly  known as:  PRILOSEC     TAKE these medications   albuterol 108 (90 Base) MCG/ACT inhaler Commonly known as:  PROVENTIL HFA;VENTOLIN HFA Inhale 2 puffs into the lungs every 6 (six) hours as needed for wheezing.   aspirin 81 MG tablet Take 81 mg by mouth daily.   atorvastatin 80 MG tablet Commonly known as:  LIPITOR Take 80 mg by mouth daily.   buPROPion 300 MG 24 hr tablet Commonly known as:  WELLBUTRIN XL Take 300 mg by mouth daily.   calcium-vitamin D 500-200 MG-UNIT tablet Commonly known as:  OSCAL WITH D Take 1 tablet by mouth daily. 500 mg by mouth daily   chlorpheniramine-HYDROcodone 10-8 MG/5ML Suer Commonly known as:  TUSSIONEX Take 5 mLs by mouth every 12 (twelve) hours.   cholecalciferol  400 units Tabs tablet Commonly known as:  VITAMIN D Take 1,000 Units by mouth daily.   cilostazol 50 MG tablet Commonly known as:  PLETAL Take 50 mg by mouth at bedtime.   clonazePAM 1 MG tablet Commonly known as:  KLONOPIN Take 1 mg by mouth daily.   DULoxetine 60 MG capsule Commonly known as:  CYMBALTA Take 1 capsule (60 mg total) by mouth daily.   EUCRISA 2 % Oint Generic drug:  Crisaborole Apply 1 application topically daily.   isosorbide mononitrate 60 MG 24 hr tablet Commonly known as:  IMDUR Take 1 tablet (60 mg total) by mouth daily.   meclizine 25 MG tablet Commonly known as:  ANTIVERT Take 25 mg by mouth daily.   metFORMIN 500 MG tablet Commonly known as:  GLUCOPHAGE Take 500 mg by mouth at bedtime.   metoprolol succinate 50 MG 24 hr tablet Commonly known as:  TOPROL-XL Take two (2) tablet (100 mg) by mouth daily.   nabumetone 500 MG tablet Commonly known as:  RELAFEN Take 1,000 mg by mouth at bedtime.   nitroGLYCERIN 0.4 MG SL tablet Commonly known as:  NITROSTAT Place 1 tablet (0.4 mg total) every 5 (five) minutes as needed under the tongue for chest pain. x3 doses as needed for chest pain   pantoprazole 40 MG tablet Commonly known as:   PROTONIX Take 1 tablet (40 mg total) by mouth 2 (two) times daily. Replaces:  esomeprazole 40 MG capsule   predniSONE 20 MG tablet Commonly known as:  DELTASONE Take 20 mg (1 tablet daily) x 5 days, then take 10 mg (1/2 tablet daily) x 5 days, then stop   vitamin B-12 1000 MCG tablet Commonly known as:  CYANOCOBALAMIN Take 2,000 mcg by mouth daily.         Outstanding Labs/Studies   PFTs (will be arranged by pulmonology)  Duration of Discharge Encounter   Greater than 30 minutes including physician time.  Signed, Robbie Lis PA-C 11/26/2017, 1:57 PM

## 2017-11-26 NOTE — Progress Notes (Signed)
D/c papers complete tele off IV removed.

## 2017-11-26 NOTE — Progress Notes (Signed)
Progress Note  Patient Name: Crystal [name] Date of Encounter: 11/26/2017  Primary Cardiologist: Donato Schultz, MD   Subjective   Feels much better today. Less coughing. The cough syurp seemed to help her the most.   Inpatient Medications    Scheduled Meds: . aspirin  81 mg Oral Daily  . atorvastatin  80 mg Oral Daily  . buPROPion  300 mg Oral Daily  . chlorpheniramine-HYDROcodone  5 mL Oral Q12H  . cilostazol  50 mg Oral QHS  . DULoxetine  60 mg Oral Daily  . isosorbide mononitrate  60 mg Oral Daily  . metoprolol succinate  100 mg Oral Daily  . pantoprazole  40 mg Oral QAC breakfast  . predniSONE  20 mg Oral Daily   Continuous Infusions:  PRN Meds: acetaminophen, guaiFENesin-dextromethorphan, ondansetron (ZOFRAN) IV   Vital Signs    Vitals:   11/25/17 1500 11/25/17 2018 11/26/17 0500 11/26/17 0801  BP: 137/64 (!) 148/59 (!) 146/52 (!) 153/50  Pulse: 64 66 69 66  Resp: 18 18 18 20   Temp: (!) 97.5 F (36.4 C) (!) 97.5 F (36.4 C) 98.1 F (36.7 C) 98.2 F (36.8 C)  TempSrc: Oral Oral Oral Oral  SpO2: 99% 98% 98% 98%  Weight:   161 lb 3.2 oz (73.1 kg)   Height:        Intake/Output Summary (Last 24 hours) at 11/26/2017 0832 Last data filed at 11/26/2017 0601 Gross per 24 hour  Intake 480 ml  Output 900 ml  Net -420 ml   Filed Weights   11/24/17 1641 11/25/17 0529 11/26/17 0500  Weight: 160 lb (72.6 kg) 164 lb (74.4 kg) 161 lb 3.2 oz (73.1 kg)    Telemetry    NSR- Personally Reviewed  ECG    Admit EKG showed sinus tach 11/24/17 - Personally Reviewed  Physical Exam   GEN: No acute distress.   Neck: No JVD Cardiac: RRR, no murmurs, rubs, or gallops.  Respiratory: Clear to auscultation bilaterally. GI: Soft, nontender, non-distended  MS: No edema; No deformity. Neuro:  Nonfocal  Psych: Normal affect   Labs    Chemistry Recent Labs  Lab 11/24/17 1643 11/25/17 0328 11/26/17 0459  NA 142 142 139  K 4.1 3.5 4.6  CL 109 113* 107  CO2 21* 18*  22  GLUCOSE 112* 136* 179*  BUN 11 12 16   CREATININE 1.44* 1.49* 1.36*  CALCIUM 9.0 8.0* 8.8*  GFRNONAA 37* 35* 39*  GFRAA 42* 41* 46*  ANIONGAP 12 11 10      Hematology Recent Labs  Lab 11/24/17 1643  WBC 10.3  RBC 3.88  HGB 13.4  HCT 38.5  MCV 99.2  MCH 34.5*  MCHC 34.8  RDW 15.2  PLT 285    Cardiac Enzymes Recent Labs  Lab 11/25/17 0328  TROPONINI 0.03*    Recent Labs  Lab 11/24/17 1657  TROPIPOC 0.01     BNP Recent Labs  Lab 11/25/17 1103  BNP 710.6*     DDimer No results for input(s): DDIMER in the last 168 hours.   Radiology    Dg Chest 2 View  Result Date: 11/24/2017 CLINICAL DATA:  Productive cough EXAM: CHEST  2 VIEW COMPARISON:  12/09/2012 FINDINGS: Post sternotomy changes. No focal consolidation or effusion. Cardiomediastinal silhouette within normal limits. Aortic atherosclerosis. No pneumothorax. IMPRESSION: No active cardiopulmonary disease. Electronically Signed   By: Jasmine Pang M.D.   On: 11/24/2017 17:02   Ct Angio Chest Pe W And/or Wo Contrast  Result Date: 11/24/2017 CLINICAL DATA:  Productive cough, shortness of breath, chest pain EXAM: CT ANGIOGRAPHY CHEST WITH CONTRAST TECHNIQUE: Multidetector CT imaging of the chest was performed using the standard protocol during bolus administration of intravenous contrast. Multiplanar CT image reconstructions and MIPs were obtained to evaluate the vascular anatomy. CONTRAST:  100mL ISOVUE-370 IOPAMIDOL (ISOVUE-370) INJECTION 76% COMPARISON:  Chest x-ray earlier today FINDINGS: Cardiovascular: Diffuse aortic calcifications. Heart is normal size. No filling defects in the pulmonary arteries to suggest pulmonary emboli. Mediastinum/Nodes: No mediastinal, hilar, or axillary adenopathy. Lungs/Pleura: No confluent airspace opacities or effusions. Upper Abdomen: Imaging into the upper abdomen shows no acute findings. Musculoskeletal: Chest wall soft tissues are unremarkable. No acute bony abnormality. Review  of the MIP images confirms the above findings. IMPRESSION: No evidence of pulmonary embolus. Prior CABG. No acute cardiopulmonary disease. Aortic Atherosclerosis (ICD10-I70.0). Electronically Signed   By: Charlett NoseKevin  Dover M.D.   On: 11/24/2017 21:50    Cardiac Studies   2D Echo 11/25/17 Study Conclusions  - Left ventricle: The cavity size was normal. Systolic function was   normal. Wall motion was normal; there were no regional wall   motion abnormalities. Features are consistent with a pseudonormal   left ventricular filling pattern, with concomitant abnormal   relaxation and increased filling pressure (grade 2 diastolic   dysfunction). Doppler parameters are consistent with   indeterminate ventricular filling pressure. - Aortic valve: There was mild regurgitation. - Mitral valve: Transvalvular velocity was within the normal range.   There was no evidence for stenosis. There was trivial   regurgitation. Valve area by pressure half-time: 1.58 cm^2. - Left atrium: The atrium was mildly dilated. - Right ventricle: The cavity size was normal. Wall thickness was   normal. Systolic function was normal. - Atrial septum: No defect or patent foramen ovale was identified. - Tricuspid valve: There was mild regurgitation. - Pulmonary arteries: Systolic pressure was within the normal   range. PA peak pressure: 23 mm Hg (S). Left atrium:  The atrium was mildly dilated.  Patient Profile     Crystal GlasgowGlenda P Longis a 67 y.o.femalewith history ofCAD with multiple PCI to RCA and s/p CABGx1 (RIMA to RCA), now known to be occluded,diabetes, hypertension, H/o TIA, small ASD, PVD and former tobacco user, who presented from her PCP office with tachycardia, dyspnea and chronic dry cough x 1 month. Chest CT negative for PE. Pt also found to be in atrial tachycardia with rates in the 140s. Pt was admitted under general cardiology for further management/ work-up.   Assessment & Plan    1. Atrial Tachycardia: newly  diagnosed this admission. Echo shows normal LVEF. RA normal in size. LA mildly dilated. Pt evaluated by EP. It is felt that her atrial tach may be related to underlying lung issues. She is back in NSR and remained stable overnight w/o any recurrent arrhthymias. Per EP, " As her lung issues have been improving, and she is in sinus rhythm, would plan to hold off on therapy for arrhythmias. If she has further arrhythmias, can follow up in EP clinic".  We appreciate EP's assistance.   2. Diastolic HF: Echo shows G2DD. Normal LVEF. BNP elevated at 710.6. Pt was given a dose of IV Lasix yesterday, 40 mg. X 1. Volume is stable. No dyspnea. SCr improved from 1.49>>1.36. K stable at 4.6. Pt will need to reduce sodium intake. Continue BB. May consider PRN home diuretic.  3. Chronic Dry Cough: CXR negative for acute process. No ACE-I. + history  of tobacco use (quit 10 years ago), h/o GERD and asthma. Chest CT showed very small areas of ground glass opacities on CT which could be post viral or edema (BNP was 700 and IV Lasix given).  Pt evaluated by pulmonology yesterday. DD includes possible GERD (PPI increased, Protonix 40 mg BID), vs COPD (PFTs to be completed as outpatient, prednisone prescribed 20 mg PO x5 days then 10 mg PO x5 days then d/c). Outpatient pulmonary f/u will be arranged. We appreciate PCCM's assistance.     4. CAD: multiple PCIs to RCA and s/p CABGx1 (RIMA to RCA), now known to be occluded. She denies ischemic chest pain. She has had atypical chest discomfort, related to cough. EF normal by echo. No WMAs. Continue medical therapy.   5. Renal Insuffiencey: SCr improving. Scr 1.49 on admit (baseline ~1.0-1.1). SCr 1.36 today after dose of IV Lasix.  6. HTN: mildly elevated. Imdur and metoprolol just given. Monitor response. Can adjust meds accordingly.   7. HLD: lipid panel recently checked by PCP 09/2017. LDL was 91 mg/dL and not at recommended goal given CAD history. LDL goal < 70 mg. She is on  Lipitor 80 mg. Recommend adding Zetia to Lipitor and recheck FLP and HFTs in 6 weeks. If not at goal, then would refer to lipid clinic for PCSK9 inhibitor therapy.   8. DM: followed by PCP. On Metformin with good control. Recent Hgb A1c at PCP office was 5.6.   9. PVD: h/o patch angioplasty to right femoral artery 01/1998. Heterogeneous plaque, bilaterally involving the carotid arteries. 1-39% bilateral ICA stenosis noted on doppler study in 2017. Asymptomatic. On ASA, Pletal and statin therapy.   10. GERD: may be contributing to chronic dry cough. Protonix increased to BID dosing. F/u with PCP.   Dispo: likely d/c home later today. MD to clear.   For questions or updates, please contact [facility] Please consult [email or web address] for contact info under Cardiology/STEMI.      Signed, Robbie Lis, PA-C  11/26/2017, 8:32 AM

## 2017-11-26 NOTE — Progress Notes (Signed)
Name: Crystal [name] MRN: [id number] DOB: [birth year]    ADMISSION DATE:  11/24/2017 CONSULTATION DATE:  11/25/17  REFERRING MD :  Dr. Rennis Golden   CHIEF COMPLAINT:  Cough    HISTORY OF PRESENT ILLNESS:  67 y/o F who presented to [facility] on 2/26 from her primary office with reports of an irregular heart rate.    PMH of CAD s/p CABG, HTN, DM and suspected COPD.    Patient reports a 1.5 month hx of runny nose & cough.  Pt reports cough is dry and "can't get anything up".  Cough is the same during the day and at night.  She reports she has tried a "whole bunch of different cough syrups but they didn't work".  Reports low grade fevers to 99.  She went to see her Primary MD for cough on 2/26 and was found at that time to have a regular / fast heart rate to 140's.  She reports wheezing during this episode of illness.  Prior to the illness she only occasionally wheezed.  The patient reports she has heart burn "all the time".  She denies post nasal gtt.  She has never been told she has asthma but does have an inhaler that she uses 1-2 x per month.  She is a former smoker, quit in 2009.  She began smoking at age 3 and smoked up to 2ppd.  She decided she was just going to quit and stopped.  At baseline, she reports she can usually walk 15-20 ft and becomes very short of breath.  She states she has a really hard time with walking stairs.    On admit she was found to have regular narrow complex tachycardia.  She was admitted by Cardiology for evaluation.  She had a CXR which was negative for acute process.  TSH assessed and within normal limits.  CTA of the chest was negative for PE but it did show small faint areas of background ground glass (tiny).   BNP 710, troponin 0.03.  The patient had persistent cough and PCCM consulted for evaluation 2/27.    SUBJECTIVE:  Feels better this AM but cough continues.  C/O hoarseness  VITAL SIGNS: Temp:  [97.5 F (36.4 C)-99 F (37.2 C)] 98.2 F (36.8 C) (02/28 0801) Pulse  Rate:  [54-70] 66 (02/28 0801) Resp:  [18-20] 20 (02/28 0801) BP: (134-153)/(50-65) 153/50 (02/28 0801) SpO2:  [98 %-99 %] 98 % (02/28 0801) Weight:  [161 lb 3.2 oz (73.1 kg)] 161 lb 3.2 oz (73.1 kg) (02/28 0500)  PHYSICAL EXAMINATION: General:  Adult female, resting in bed and NAD Neuro:  Alert and oriented x4, moving all ext to command HEENT:  [employer]/AT, PERRL, EOM-I and MMM Cardiovascular:  RRR, Nl S1/S2 and -M/R/G. Lungs:  Dry cough, CTA bilaterally, no wheezing Abdomen:  Obese, soft, NT, ND and +BS Musculoskeletal:  No acute deformities  Skin:  Warm/dry, no rashes or lesions   Recent Labs  Lab 11/24/17 1643 11/25/17 0328 11/26/17 0459  NA 142 142 139  K 4.1 3.5 4.6  CL 109 113* 107  CO2 21* 18* 22  BUN 11 12 16   CREATININE 1.44* 1.49* 1.36*  GLUCOSE 112* 136* 179*   Recent Labs  Lab 11/24/17 1643  HGB 13.4  HCT 38.5  WBC 10.3  PLT 285   Dg Chest 2 View  Result Date: 11/24/2017 CLINICAL DATA:  Productive cough EXAM: CHEST  2 VIEW COMPARISON:  12/09/2012 FINDINGS: Post sternotomy changes. No focal consolidation or effusion.  Cardiomediastinal silhouette within normal limits. Aortic atherosclerosis. No pneumothorax. IMPRESSION: No active cardiopulmonary disease. Electronically Signed   By: Jasmine PangKim  Fujinaga M.D.   On: 11/24/2017 17:02   Ct Angio Chest Pe W And/or Wo Contrast  Result Date: 11/24/2017 CLINICAL DATA:  Productive cough, shortness of breath, chest pain EXAM: CT ANGIOGRAPHY CHEST WITH CONTRAST TECHNIQUE: Multidetector CT imaging of the chest was performed using the standard protocol during bolus administration of intravenous contrast. Multiplanar CT image reconstructions and MIPs were obtained to evaluate the vascular anatomy. CONTRAST:  100mL ISOVUE-370 IOPAMIDOL (ISOVUE-370) INJECTION 76% COMPARISON:  Chest x-ray earlier today FINDINGS: Cardiovascular: Diffuse aortic calcifications. Heart is normal size. No filling defects in the pulmonary arteries to suggest  pulmonary emboli. Mediastinum/Nodes: No mediastinal, hilar, or axillary adenopathy. Lungs/Pleura: No confluent airspace opacities or effusions. Upper Abdomen: Imaging into the upper abdomen shows no acute findings. Musculoskeletal: Chest wall soft tissues are unremarkable. No acute bony abnormality. Review of the MIP images confirms the above findings. IMPRESSION: No evidence of pulmonary embolus. Prior CABG. No acute cardiopulmonary disease. Aortic Atherosclerosis (ICD10-I70.0). Electronically Signed   By: Charlett NoseKevin  Dover M.D.   On: 11/24/2017 21:50      SIGNIFICANT EVENTS  2/26  Admit with tachycardia  2/27  PCCM consulted  STUDIES CTA Chest 2/26 that I reviewed myself, negative for PE, prior CABG, no acute cardiopulmonary disease  CULTURES   ANTIBIOTICS     ASSESSMENT / PLAN:  Discussion:  67 y/o F, former smoker, who was admitted with reports of tachycardia and cough.  Suspect recent URI with post viral cough syndrome (this may also have driven her tachycardia).  However, she does carry a significant smoking history that would lend to obstructive lung disease.  In addition, she has GERD and possible post nasal gtt.  CXR and CT are negative for infiltrate.  She does have some very small areas of ground glass opacities on CT which could be post viral or edema.     Cough: ?GERD, patient takes 2 40 mg tablets of PO protonix in AM, no PND by history  - Change protonix to 40 mg PO BID  - Tussionex  Recent URI   - Monitor, no active treatments for now.  Suspected COPD - not an exacerbation  - PFTs as outpatient  - Quickly taper off prednisone, 20 mg PO x5 days then 10 mg PO x5 days then d/c  - Pulmonary hygienes  - OP appointment made  GERD   - Protonix BID including at home  - Magnesium supplementation   PCCM will sign off, please call back if needed.  Alyson ReedyWesam G. Harvey Lingo, M.D. [facility]. Pager: [phone]. After hours pager: [phone].

## 2017-11-27 ENCOUNTER — Telehealth: Payer: Self-pay | Admitting: Cardiology

## 2017-11-27 NOTE — Telephone Encounter (Signed)
New message  Not cover by her insurance   *STAT* If patient is at the pharmacy, call can be transferred to refill team.   1. Which medications need to be refilled? (please list name of each medication and dose if known) chlorpheniramine-HYDROcodone (TUSSIONEX) 10-8 MG/5ML SUER  2. Which pharmacy/location (including street and city if local pharmacy) is medication to be sent to? [phone] - [facility]   3. Do they need a 30 day or 90 day supply? Liquid form

## 2017-11-30 NOTE — Telephone Encounter (Signed)
I have attempted a Tussionex PA through covermymeds but was unsuccessful as I received an immediate message that this medication is not covered under the pts plan.  I have notified [facility] and I have left a message asking the pt to call me back.

## 2017-12-01 NOTE — Progress Notes (Signed)
History of Present Illness Crystal [name] is a 67 y.o. female former smoker ( Quit 09/2010 with a 30 + pack year smoking history) with recent admission for tachycardia and cough. She endorses a history of Asthma, GERD, and PND. She has diastolic CHF. She was seen by Dr. Vassie Loll and Canary Brim NP  for an inpatient consult.  Discussion:   67 y/o F, former smoker, who was admitted 11/24/2017-11/26/2017  with reports of tachycardia and cough. No ACEI therapy. Suspect recent URI with post viral cough syndrome (this may also have driven her tachycardia). Cardiology felt her heart issues were related to her pulmonary issues as when her cough got better she converted into NSR without any recurrent arrythmias. (Pulmonary was consulted 11/25/2017) .  Plan per cards is to follow up in EP lab for any recurrent rhythm issues. She does carry a significant smoking history that would lend to obstructive lung disease.  In addition, she has GERD and possible post nasal gtt.  CXR and CT are negative for infiltrate.  She does have some very small areas of ground glass opacities on CT which could be post viral or edema. She was discharged  with a prednisone taper,prn albuterol, Tussinex cough syrup and Protonix was increased to 40 mg BID. Follow up was scheduled with pulmonary and cardiology. Pt. Will need PFT's as an outpatient once cough resolved  to establish pulmonary function/ COPD / Asthma as she has an extensive smoking history.    12/02/2017 Hospital Follow up: Pt. Presents for follow up. She states she was compliant with her prednisone taper, protonix 40 mg BID . She called Dr. Anne Fu office for a refill of her Tussinex 11/27/2017.She was unable to get the medication due to cost. This medication is not covered by  Medicaid.She states she does have a cough.she did not call the office when she was unable to get the Tussionex.  She is not taking anything for cough, even  over the counter medication. She has never tried Delsym.   We discussed the link between the cough and her cardiac arrhythmias, and I encouraged her to manage her cough well.  Next week she states she is not smoking but she smells very strongly of smoke today.She states she does still have fatigue.She states she does not feel short of breath. She is using her Proventil as needed for shortness of breath, which is rare..She denies any post nasal drip.She is not taking allergy medication.She states she has had an increase in weight by 10 pounds in the last week. She is not on any diuretic now. She states she has been on a low salt diet.Marland KitchenShe will follow up with Dr. Anne Fu next week.  She has not been aware of her heart racing, dizziness or other indication of cardiac arrhythmia.  She denies fever, chest pain, orthopnea, or hemoptysis.  Test Results: FENO 12/02/2017>> 10 ppb CTA Chest 2/26 >>, negative for PE, prior CABG, no acute cardiopulmonary disease CXR 2/26>>No active cardiopulmonary disease. Echo:11/25/2017>> Left ventricle: The cavity size was normal. Systolic function was   normal. Wall motion was normal; there were no regional wall   motion abnormalities. Features are consistent with a pseudonormal   left ventricular filling pattern, with concomitant abnormal   relaxation and increased filling pressure (grade 2 diastolic   dysfunction). Doppler parameters are consistent with   indeterminate ventricular filling pressure. - Aortic valve: There was mild regurgitation. - Mitral valve: Transvalvular velocity was within the normal range.   There was  no evidence for stenosis. There was trivial   regurgitation. Valve area by pressure half-time: 1.58 cm^2. - Left atrium: The atrium was mildly dilated. - Right ventricle: The cavity size was normal. Wall thickness was   normal. Systolic function was normal. - Atrial septum: No defect or patent foramen ovale was identified. - Tricuspid valve: There was mild regurgitation. - Pulmonary arteries: Systolic  pressure was within the normal   range. PA peak pressure: 23 mm Hg (S).    CBC Latest Ref Rng & Units 11/24/2017 12/11/2012 12/10/2012  WBC 4.0 - 10.5 K/uL 10.3 6.1 8.8  Hemoglobin 12.0 - 15.0 g/dL 16.113.4 11.3(L) 12.4  Hematocrit 36.0 - 46.0 % 38.5 30.8(L) 34.1(L)  Platelets 150 - 400 K/uL 285 185 188    BMP Latest Ref Rng & Units 11/26/2017 11/25/2017 11/24/2017  Glucose 65 - 99 mg/dL 096(E179(H) 454(U136(H) 981(X112(H)  BUN 6 - 20 mg/dL 16 12 11   Creatinine 0.44 - 1.00 mg/dL 9.14(N1.36(H) 8.29(F1.49(H) 6.21(H1.44(H)  Sodium 135 - 145 mmol/L 139 142 142  Potassium 3.5 - 5.1 mmol/L 4.6 3.5 4.1  Chloride 101 - 111 mmol/L 107 113(H) 109  CO2 22 - 32 mmol/L 22 18(L) 21(L)  Calcium 8.9 - 10.3 mg/dL 0.8(M8.8(L) 5.7(Q8.0(L) 9.0    BNP    Component Value Date/Time   BNP 710.6 (H) 11/25/2017 1103    ProBNP No results found for: PROBNP  PFT No results found for: FEV1PRE, FEV1POST, FVCPRE, FVCPOST, TLC, DLCOUNC, PREFEV1FVCRT, PSTFEV1FVCRT  Dg Chest 2 View  Result Date: 11/24/2017 CLINICAL DATA:  Productive cough EXAM: CHEST  2 VIEW COMPARISON:  12/09/2012 FINDINGS: Post sternotomy changes. No focal consolidation or effusion. Cardiomediastinal silhouette within normal limits. Aortic atherosclerosis. No pneumothorax. IMPRESSION: No active cardiopulmonary disease. Electronically Signed   By: Jasmine PangKim  Fujinaga M.D.   On: 11/24/2017 17:02   Ct Angio Chest Pe W And/or Wo Contrast  Result Date: 11/24/2017 CLINICAL DATA:  Productive cough, shortness of breath, chest pain EXAM: CT ANGIOGRAPHY CHEST WITH CONTRAST TECHNIQUE: Multidetector CT imaging of the chest was performed using the standard protocol during bolus administration of intravenous contrast. Multiplanar CT image reconstructions and MIPs were obtained to evaluate the vascular anatomy. CONTRAST:  100mL ISOVUE-370 IOPAMIDOL (ISOVUE-370) INJECTION 76% COMPARISON:  Chest x-ray earlier today FINDINGS: Cardiovascular: Diffuse aortic calcifications. Heart is normal size. No filling defects in  the pulmonary arteries to suggest pulmonary emboli. Mediastinum/Nodes: No mediastinal, hilar, or axillary adenopathy. Lungs/Pleura: No confluent airspace opacities or effusions. Upper Abdomen: Imaging into the upper abdomen shows no acute findings. Musculoskeletal: Chest wall soft tissues are unremarkable. No acute bony abnormality. Review of the MIP images confirms the above findings. IMPRESSION: No evidence of pulmonary embolus. Prior CABG. No acute cardiopulmonary disease. Aortic Atherosclerosis (ICD10-I70.0). Electronically Signed   By: Charlett NoseKevin  Dover M.D.   On: 11/24/2017 21:50     Past medical hx Past Medical History:  Diagnosis Date  . Angina pectoris (HCC)   . Anxiety   . ASCVD (arteriosclerotic cardiovascular disease)    single vessel  . ASD (atrial septal defect)    Small ASD with insignificant shunt  . Back pain, chronic   . Chronic ischemic heart disease   . Claudication (HCC)   . Coronary atherosclerosis of native coronary artery   . Depression   . Diabetes mellitus without complication (HCC)   . Edema   . GERD (gastroesophageal reflux disease)   . Hypercholesteremia   . Hypertension   . MRSA carrier    Chronic  . Occlusion  and stenosis of carotid artery without mention of cerebral infarction    Last carotid doppler was in 2006. Patch angioplasty right femoral artery 01/1998, patenet 09/2005 ([facility])  . Osteoarthritis   . Osteopenia   . Other and unspecified angina pectoris   . PAD (peripheral artery disease) (HCC)   . Postsurgical aortocoronary bypass status   . Postsurgical percutaneous transluminal coronary angioplasty status   . Pseudodementia   . Right carotid bruit    Negative doppler 09/2002  . SOB (shortness of breath)    ECHO 04/13/12 EF estimated at 55-60%     Social History   Tobacco Use  . Smoking status: Former Smoker    Packs/day: 2.00    Years: 40.00    Pack years: 80.00    Types: Cigarettes    Last attempt to quit: 09/29/2010    Years since quitting:  7.1  . Smokeless tobacco: Never Used  Substance Use Topics  . Alcohol use: No  . Drug use: No    Ms.Mcnutt reports that she quit smoking about 7 years ago. Her smoking use included cigarettes. She has a 80.00 pack-year smoking history. she has never used smokeless tobacco. She reports that she does not drink alcohol or use drugs.  Tobacco Cessation: Former smoker quit 2012 with a 30 + pack year smoking history   Past surgical hx, Family hx, Social hx all reviewed.  Current Outpatient Medications on File Prior to Visit  Medication Sig  . albuterol (PROVENTIL HFA;VENTOLIN HFA) 108 (90 BASE) MCG/ACT inhaler Inhale 2 puffs into the lungs every 6 (six) hours as needed for wheezing.   Marland Kitchen aspirin 81 MG tablet Take 81 mg by mouth daily.  Marland Kitchen atorvastatin (LIPITOR) 80 MG tablet Take 80 mg by mouth daily.  Marland Kitchen buPROPion (WELLBUTRIN XL) 300 MG 24 hr tablet Take 300 mg by mouth daily.  . calcium-vitamin D (OSCAL WITH D) 500-200 MG-UNIT per tablet Take 1 tablet by mouth daily. 500 mg by mouth daily  . chlorpheniramine-HYDROcodone (TUSSIONEX) 10-8 MG/5ML SUER Take 5 mLs by mouth every 12 (twelve) hours.  . cholecalciferol (VITAMIN D) 400 UNITS TABS tablet Take 1,000 Units by mouth daily.  . cilostazol (PLETAL) 50 MG tablet Take 50 mg by mouth at bedtime.   . clonazePAM (KLONOPIN) 1 MG tablet Take 1 mg by mouth daily.  . DULoxetine (CYMBALTA) 60 MG capsule Take 1 capsule (60 mg total) by mouth daily.  . EUCRISA 2 % OINT Apply 1 application topically daily.  . isosorbide mononitrate (IMDUR) 60 MG 24 hr tablet Take 1 tablet (60 mg total) by mouth daily.  . meclizine (ANTIVERT) 25 MG tablet Take 25 mg by mouth daily.  . metFORMIN (GLUCOPHAGE) 500 MG tablet Take 500 mg by mouth at bedtime.   . metoprolol succinate (TOPROL-XL) 50 MG 24 hr tablet Take two (2) tablet (100 mg) by mouth daily.  . nabumetone (RELAFEN) 500 MG tablet Take 1,000 mg by mouth at bedtime.   . nitroGLYCERIN (NITROSTAT) 0.4 MG SL tablet  Place 1 tablet (0.4 mg total) every 5 (five) minutes as needed under the tongue for chest pain. x3 doses as needed for chest pain  . pantoprazole (PROTONIX) 40 MG tablet Take 1 tablet (40 mg total) by mouth 2 (two) times daily.  . predniSONE (DELTASONE) 20 MG tablet Take 20 mg (1 tablet daily) x 5 days, then take 10 mg (1/2 tablet daily) x 5 days, then stop  . vitamin B-12 (CYANOCOBALAMIN) 1000 MCG tablet Take 2,000 mcg by  mouth daily.   No current facility-administered medications on file prior to visit.      Allergies  Allergen Reactions  . Bactrim [Sulfamethoxazole-Trimethoprim] Nausea And Vomiting  . Penicillins     Unknown   . Sulfa Antibiotics     unknown    Review Of Systems:  Constitutional:   No  weight loss, night sweats,  Fevers, chills, fatigue, or  lassitude.  HEENT:   No headaches,  Difficulty swallowing,  Tooth/dental problems, or  Sore throat,                No sneezing, itching, ear ache, nasal congestion, post nasal drip,   CV:  No chest pain,  Orthopnea, PND, swelling in lower extremities, anasarca, dizziness, palpitations, syncope.   GI  No heartburn, indigestion, abdominal pain, nausea, vomiting, diarrhea, change in bowel habits, loss of appetite, bloody stools.   Resp: No shortness of breath with exertion or at rest.  No excess mucus, no productive cough,  + non-productive cough,  No coughing up of blood.  No change in color of mucus.  No wheezing.  No chest wall deformity  Skin: no rash or lesions.  GU: no dysuria, change in color of urine, no urgency or frequency.  No flank pain, no hematuria   MS:  No joint pain or swelling.  No decreased range of motion.  No back pain.  Psych:  No change in mood or affect. No depression or anxiety.  No memory loss.   Vital Signs BP 122/62 (BP Location: Left Arm, Cuff Size: Normal)   Pulse 70   Ht 5' (1.524 m)   Wt 159 lb (72.1 kg)   SpO2 98%   BMI 31.05 kg/m    Physical Exam:  General- No distress,  A&Ox3,  pleasant ENT: No sinus tenderness, TM clear, pale nasal mucosa, no oral exudate,no post nasal drip, no LAN Cardiac: S1, S2, regular rate and rhythm, no murmur Chest: No wheeze/ rales/ dullness; no accessory muscle use, no nasal flaring, no sternal retractions Abd.: Soft Non-tender, nondistended, obese Ext: No clubbing cyanosis, no edema Neuro: Deconditioned at baseline, moving all extremities x4, alert and oriented x3. Skin: No rashes, warm and dry Psych: normal mood and behavior   Assessment/Plan  Subacute cough Continued cough Did not obtain cough syrup upon discharge Lack of self-motivation/personal accountability to follow through with obtaining cough medication once initial prescription was denied Plan Education regarding the relation ship of her cough to her cardiac arrhythmias Education regarding the need for her to be attentive to cough management FENO today>> unable due to cough Ambulatory referral to Lung Cancer Screening.( pt requests we wait 6 months to call her) Prescription for Hydromet cough syrup 5 cc's at bedtime. Delsym cough syrup every 12 hours during the day. Sips of water instead of throat clearing Throat soothing with sugar free jolly ranchers of [place] Avoid menthol, Peppermint, Spearmint and chocolate. Avoid caffeine Continue Protonix 40 mg twice daily. Finish prednisone as prescribed. We will schedule PFT's  Referral to Dr. Vassie Loll for follow up per Canary Brim in 1 month, after PFT's Call Dr. Anne Fu office and let them know about your weight increase.Marland Kitchen Please contact office for sooner follow up if symptoms do not improve or worsen or seek emergency care     Atrial tachycardia (HCC) Suspect secondary to cough No signs or symptoms of racing heart or arrhythmia per patient Plan Follow-up with Dr. Anne Fu as is  Scheduled Monitor weight, and follow-up with Dr. Anne Fu Manage  cough with Hydromet cough syrup, Delsym, and cough regimen as detailed in after  visit summary Follow-up in 4 weeks with Dr. Vassie Loll  Suspected tobacco abuse Patient smells strongly of cigarette smoke at today's OV When asked she denied smoking When asked she denied living with cigarette smoker, or being around a cigarette smoker. Plan Reinforced need for complete smoking abstinence   Bevelyn Ngo, NP 12/02/2017  9:00 PM

## 2017-12-02 ENCOUNTER — Ambulatory Visit (INDEPENDENT_AMBULATORY_CARE_PROVIDER_SITE_OTHER): Payer: Medicare Other | Admitting: Acute Care

## 2017-12-02 ENCOUNTER — Encounter: Payer: Self-pay | Admitting: Acute Care

## 2017-12-02 VITALS — BP 122/62 | HR 70 | Ht 60.0 in | Wt 159.0 lb

## 2017-12-02 DIAGNOSIS — Z720 Tobacco use: Secondary | ICD-10-CM

## 2017-12-02 DIAGNOSIS — R052 Subacute cough: Secondary | ICD-10-CM

## 2017-12-02 DIAGNOSIS — I471 Supraventricular tachycardia: Secondary | ICD-10-CM

## 2017-12-02 DIAGNOSIS — R059 Cough, unspecified: Secondary | ICD-10-CM

## 2017-12-02 DIAGNOSIS — R05 Cough: Secondary | ICD-10-CM

## 2017-12-02 LAB — NITRIC OXIDE: Nitric Oxide: 10

## 2017-12-02 MED ORDER — HYDROCODONE-HOMATROPINE 5-1.5 MG/5ML PO SYRP: 5.0000 mL | ORAL_SOLUTION | Freq: Four times a day (QID) | ORAL | 0 refills | Status: DC | PRN

## 2017-12-02 MED ORDER — HYDROCODONE-HOMATROPINE 5-1.5 MG/5ML PO SYRP
5.0000 mL | ORAL_SOLUTION | Freq: Four times a day (QID) | ORAL | 0 refills | Status: DC | PRN
Start: 2017-12-02 — End: 2017-12-02

## 2017-12-02 NOTE — Patient Instructions (Addendum)
It is nice to see you today. FENO today>> unable due to cough Ambulatory referral to Lung Cancer Screening.( pt requests we wait 6 months to call her) Prescription for Hydromet cough syrup 5 cc's at bedtime. Delsym cough syrup every 12 hours during the day. Sips of water instead of throat clearing Throat soothing with sugar free jolly ranchers of [place] Avoid menthol, Peppermint, Spearmint and chocolate. Avoid caffeine Continue Protonix 40 mg twice daily. Finish prednisone as prescribed. We will schedule PFT's  Referral to Dr. Vassie LollAlva for follow up per Canary BrimBrandi Ollis in 1 month, after PFT's Call Dr. Anne FuSkains office and let them know about your weight increase.Marland Kitchen. Please contact office for sooner follow up if symptoms do not improve or worsen or seek emergency care

## 2017-12-02 NOTE — Assessment & Plan Note (Signed)
Continued cough Did not obtain cough syrup upon discharge Lack of self-motivation/personal accountability to follow through with obtaining cough medication once initial prescription was denied Plan Education regarding the relation ship of her cough to her cardiac arrhythmias Education regarding the need for her to be attentive to cough management FENO today>> unable due to cough Ambulatory referral to Lung Cancer Screening.( pt requests we wait 6 months to call her) Prescription for Hydromet cough syrup 5 cc's at bedtime. Delsym cough syrup every 12 hours during the day. Sips of water instead of throat clearing Throat soothing with sugar free jolly ranchers of [place] Avoid menthol, Peppermint, Spearmint and chocolate. Avoid caffeine Continue Protonix 40 mg twice daily. Finish prednisone as prescribed. We will schedule PFT's  Referral to Dr. Vassie LollAlva for follow up per Canary BrimBrandi Ollis in 1 month, after PFT's Call Dr. Anne FuSkains office and let them know about your weight increase.Marland Kitchen. Please contact office for sooner follow up if symptoms do not improve or worsen or seek emergency care

## 2017-12-02 NOTE — Assessment & Plan Note (Signed)
Suspect secondary to cough No signs or symptoms of racing heart or arrhythmia per patient Plan Follow-up with Dr. Anne FuSkains as is  Scheduled Monitor weight, and follow-up with Dr. Anne FuSkains Manage cough with Hydromet cough syrup, Delsym, and cough regimen as detailed in after visit summary Follow-up in 4 weeks with Dr. Vassie LollAlva

## 2017-12-08 ENCOUNTER — Ambulatory Visit: Payer: Medicare Other | Admitting: Nurse Practitioner

## 2017-12-08 NOTE — Progress Notes (Deleted)
CARDIOLOGY OFFICE NOTE  Date:  12/08/2017    [name] Luhn Date of Birth: 1951/09/18 Medical Record #[id number]  PCP:  Lonie Peak, PA-C  Cardiologist:  Anne Fu    No chief complaint on file.   History of Present Illness: Crystal [name] is a 67 y.o. female who presents today for a post hospital visit. Seen for Dr. Anne Fu.   She has a history of CAD with multiple PCIs to RCA and subsequent CABGx1 (RIMA to RCA) - now known to be occluded. Her other issues include diabetes, hypertension, H/o TIA, small ASD, PVD and former tobacco user.   She was last seen here in May of 2018.   Presented to the hospital earlier this month with tachycardia, dyspnea and chronic dry cough x 1 month. Chest CT negative for PE.Pt found to be in atrial tachycardia with rates in the 140s. Pt was admitted under general cardiology for further management/ work-up.  Pt was admitted to telemetry for monitoring. Her atrial tach resolved spontaneously. Echo with normal EF. EP was consulted for recommendations - it was their impression that this was related to underlying lung isues - she stayed in NSR. Would hold on AAD for now but could follow up with EP as needed. She was treated for diastolic HF with diuretics. Pulmonology was also consulted to assist with w/u given her persistent dry cough. Chest CT showedvery small areas of ground glass opacities on CT which could be post viral or edema. She was to have outpatient PFTs and was placed on PPI therapy. She did not have chest pain during that admission. Zetia was added to her lipid lowering regimen.   Comes in today. Here with   Past Medical History:  Diagnosis Date  . Angina pectoris (HCC)   . Anxiety   . ASCVD (arteriosclerotic cardiovascular disease)    single vessel  . ASD (atrial septal defect)    Small ASD with insignificant shunt  . Back pain, chronic   . Chronic ischemic heart disease   . Claudication (HCC)   . Coronary atherosclerosis of  native coronary artery   . Depression   . Diabetes mellitus without complication (HCC)   . Edema   . GERD (gastroesophageal reflux disease)   . Hypercholesteremia   . Hypertension   . MRSA carrier    Chronic  . Occlusion and stenosis of carotid artery without mention of cerebral infarction    Last carotid doppler was in 2006. Patch angioplasty right femoral artery 01/1998, patenet 09/2005 ([facility])  . Osteoarthritis   . Osteopenia   . Other and unspecified angina pectoris   . PAD (peripheral artery disease) (HCC)   . Postsurgical aortocoronary bypass status   . Postsurgical percutaneous transluminal coronary angioplasty status   . Pseudodementia   . Right carotid bruit    Negative doppler 09/2002  . SOB (shortness of breath)    ECHO 04/13/12 EF estimated at 55-60%    Past Surgical History:  Procedure Laterality Date  . CORONARY ANGIOPLASTY WITH STENT PLACEMENT  01/15/98   PTCA stent RCA, PTCA diagonal  . CORONARY ANGIOPLASTY WITH STENT PLACEMENT  08/17/00   Cutting balloon ostial RCA  . CORONARY ANGIOPLASTY WITH STENT PLACEMENT  01/19/01   Unsuccessful attempted PTCA ostial RCA, stent protruding into root  . CORONARY ANGIOPLASTY WITH STENT PLACEMENT  01/20/01   RIMA-RCA, occluded cath 10/2003  . CORONARY ANGIOPLASTY WITH STENT PLACEMENT  02/02/04   Successful PTCA/drug eluding stent ostial RCA, [facility]  . TUBAL  LIGATION       Medications: No outpatient medications have been marked as taking for the 12/08/17 encounter (Appointment) with Rosalio MacadamiaGerhardt, Nichalas Coin C, NP.     Allergies: Allergies  Allergen Reactions  . Bactrim [Sulfamethoxazole-Trimethoprim] Nausea And Vomiting  . Penicillins     Unknown   . Sulfa Antibiotics     unknown    Social History: The patient  reports that she quit smoking about 7 years ago. Her smoking use included cigarettes. She has a 80.00 pack-year smoking history. she has never used smokeless tobacco. She reports that she does not drink alcohol or  use drugs.   Family History: The patient's family history includes Heart attack in her mother.   Review of Systems: Please see the history of present illness.   Otherwise, the review of systems is positive for none.   All other systems are reviewed and negative.   Physical Exam: VS:  There were no vitals taken for this visit. Marland Kitchen.  BMI There is no height or weight on file to calculate BMI.  Wt Readings from Last 3 Encounters:  12/02/17 159 lb (72.1 kg)  11/26/17 161 lb 3.2 oz (73.1 kg)  06/16/17 155 lb (70.3 kg)    General: Pleasant. Well developed, well nourished and in no acute distress.   HEENT: Normal.  Neck: Supple, no JVD, carotid bruits, or masses noted.  Cardiac: Regular rate and rhythm. No murmurs, rubs, or gallops. No edema.  Respiratory:  Lungs are clear to auscultation bilaterally with normal work of breathing.  GI: Soft and nontender.  MS: No deformity or atrophy. Gait and ROM intact.  Skin: Warm and dry. Color is normal.  Neuro:  Strength and sensation are intact and no gross focal deficits noted.  Psych: Alert, appropriate and with normal affect.   LABORATORY DATA:  EKG:  EKG is ordered today. This demonstrates .  Lab Results  Component Value Date   WBC 10.3 11/24/2017   HGB 13.4 11/24/2017   HCT 38.5 11/24/2017   PLT 285 11/24/2017   GLUCOSE 179 (H) 11/26/2017   CHOL 153 01/23/2015   TRIG 197.0 (H) 01/23/2015   HDL 32.60 (L) 01/23/2015   LDLCALC 81 01/23/2015   ALT 3 01/23/2015   AST 21 12/10/2012   NA 139 11/26/2017   K 4.6 11/26/2017   CL 107 11/26/2017   CREATININE 1.36 (H) 11/26/2017   BUN 16 11/26/2017   CO2 22 11/26/2017   TSH 0.971 11/25/2017   INR 0.93 10/15/2010   HGBA1C  04/25/2010    5.4 (NOTE)                                                                       According to the ADA Clinical Practice Recommendations for 2011, when HbA1c is used as a screening test:   >=6.5%   Diagnostic of Diabetes Mellitus           (if abnormal  result  is confirmed)  5.7-6.4%   Increased risk of developing Diabetes Mellitus  References:Diagnosis and Classification of Diabetes Mellitus,Diabetes Care,2011,34(Suppl 1):S62-S69 and Standards of Medical Care in         Diabetes - 2011,Diabetes Care,2011,34  (Suppl 1):S11-S61.     BNP (last 3 results) Recent  Labs    12/06/17 1103  BNP 710.6*    ProBNP (last 3 results) No results for input(s): PROBNP in the last 8760 hours.   Other Studies Reviewed Today:  2D Echo 12/06/2017 Study Conclusions  - Left ventricle: The cavity size was normal. Systolic function was normal. Wall motion was normal; there were no regional wall motion abnormalities. Features are consistent with a pseudonormal left ventricular filling pattern, with concomitant abnormal relaxation and increased filling pressure (grade 2 diastolic dysfunction). Doppler parameters are consistent with indeterminate ventricular filling pressure. - Aortic valve: There was mild regurgitation. - Mitral valve: Transvalvular velocity was within the normal range. There was no evidence for stenosis. There was trivial regurgitation. Valve area by pressure half-time: 1.58 cm^2. - Left atrium: The atrium was mildly dilated. - Right ventricle: The cavity size was normal. Wall thickness was normal. Systolic function was normal. - Atrial septum: No defect or patent foramen ovale was identified. - Tricuspid valve: There was mild regurgitation. - Pulmonary arteries: Systolic pressure was within the normal range. PA peak pressure: 23 mm Hg (S). Left atrium: The atrium was mildly dilated.    Assessment/Plan:  1. Atrial Tachycardia: newly diagnosed this admission. Echo shows normal LVEF. RA normal in size. LA mildly dilated. Pt evaluated by EP. It is felt that her atrial tach may be related to underlying lung issues. She is back in NSR and remained stable overnight w/o any recurrent arrhthymias. Per EP, "As her  lung issues have been improving, and she is in sinus rhythm, would plan to hold off on therapy for arrhythmias. If she has further arrhythmias, can follow up in EP clinic". AAD therapy can be considered if recurrence.We appreciate EP's assistance.   2. Diastolic ZO:XWRU shows G2DD. Normal LVEF. BNP elevated at 710.6. Pt was given a dose of IV Lasix, 40 mg. X 1. Volume is stable. No dyspnea. SCr improved from 1.49>>1.36. K stable at 4.6. Pt encouraged to reduce sodium intake at home. Continue BB therapy with metoprolol.   3. Chronic Dry Cough: CXR negative for acute process. No ACE-I. + history of tobacco use (quit 10 years ago), h/o GERD and asthma. Chest CT showedvery small areas of ground glass opacities on CT which could be post viral or edema(BNP was 700 and IV Lasix given).Pt evaluated by pulmonology 12/06/17. DD includes possible GERD (PPI increased, Protonix 40 mg BID), vs COPD (PFTs to be completed as outpatient, prednisone prescribed20 mg PO x5 days then 10 mg PO x5 days then d/c, Rx for Tussinex also given). Outpatient pulmonary f/u will be arranged. We appreciate PCCM's assistance.   4. EAV:[id number] PCIsto RCA and s/p CABGx1 (RIMA to RCA), now known to be occluded. She denies ischemic chest pain. She has had atypical chest discomfort, related to cough.EF normal by echo. No WMAs.Continue medical therapy.  5. Renal Insuffiencey: SCr improving. Scr 1.49 on admit (baseline ~1.0-1.1). SCr  Improved to 1.36 after dose of IV Lasix.  6. HTN: BP remained stable. Home regimen was continued.   7. HLD: lipid panel recently checked by PCP 09/2017. LDL was 91 mg/dL and not at recommended goal given CAD history. LDL goal <70 mg. She is on Lipitor 80 mg. Recommend adding Zetia to Lipitor and recheck FLP and HFTs in 6 weeks. If not at goal, then would refer to lipid clinic for PCSK9 inhibitor therapy.   8. DM: followed by PCP. On Metformin with good control. Recent Hgb A1c at PCP office  was 5.6.  9. PVD: h/o  patch angioplastytoright femoral artery 01/1998.Heterogeneous plaque, bilaterallyinvolving the carotid arteries. 1-39% bilateral ICA stenosisnoted on doppler study in 2017. Asymptomatic. On ASA, Pletal and statin therapy.  10. GERD: may be contributing to chronic dry cough. Protonix increased to BID dosing. F/u with PCP.   Pt was seen and examined by Dr. Rennis Golden on 11/26/17, who determined she was stable for discharge home. Rx for predispose with dosing/taper instructions was provided, along w/ Rx for Tussinex. Pt also instructed to increase Protonix to 40 mg BID, as recommended by pulmonology. Post hospital f/u will be arranged in our office. Pulmonology has arranged outpatient f/u in their office and pt will be set up to have PFTs completed.     Current medicines are reviewed with the patient today.  The patient does not have concerns regarding medicines other than what has been noted above.  The following changes have been made:  See above.  Labs/ tests ordered today include:   No orders of the defined types were placed in this encounter.    Disposition:   FU with Dr. Anne Fu as planned and pulmonary as planned.   Patient is agreeable to this plan and will call if any problems develop in the interim.   SignedNorma Fredrickson, NP  12/08/2017 7:29 AM  [facility] [address] [place], [place]  [address] Phone: [phone] Fax: [phone]

## 2017-12-09 ENCOUNTER — Encounter: Payer: Self-pay | Admitting: Nurse Practitioner

## 2017-12-29 ENCOUNTER — Other Ambulatory Visit: Payer: Self-pay | Admitting: Cardiology

## 2017-12-29 MED ORDER — ISOSORBIDE MONONITRATE ER 60 MG PO TB24: 60.0000 mg | ORAL_TABLET | Freq: Every day | ORAL | 1 refills | Status: DC

## 2018-01-26 ENCOUNTER — Telehealth: Payer: Self-pay | Admitting: Cardiology

## 2018-01-26 NOTE — Telephone Encounter (Signed)
Okay to refill pantoprazole? Please advise. Thanks, MI 

## 2018-01-26 NOTE — Telephone Encounter (Signed)
°*  STAT* If patient is at the pharmacy, call can be transferred to refill team.   1. Which medications need to be refilled? (please list name of each medication and dose if known) pantoprazole (PROTONIX) 40 MG tablet   isosorbide mononitrate (IMDUR) 60 MG 24 hr tablet   2. Which pharmacy/location (including street and city if local pharmacy) is medication to be sent to?     [facility] - [place], [place] - [address] [phone] (Phone) [phone] (Fax)   3. Do they need a 30 day or 90 day supply? 60 a month for 3 months

## 2018-01-27 NOTE — Telephone Encounter (Signed)
Attempted to reach patient but did not get an answer. Will try again later.

## 2018-01-27 NOTE — Telephone Encounter (Signed)
Per hospital d/c note  GERD: may be contributing to chronic dry cough. Protonix increased to BID dosing. F/u with PCP.   Should be obtained from PCP.

## 2018-01-29 NOTE — Telephone Encounter (Signed)
Attempted to reach patient but she did not answer and vm was full.

## 2018-01-29 NOTE — Telephone Encounter (Signed)
Unable to reach patient to make her aware to contact pcp for refill on protonix.

## 2018-02-02 ENCOUNTER — Ambulatory Visit: Payer: Medicare Other | Admitting: Pulmonary Disease

## 2018-03-11 ENCOUNTER — Ambulatory Visit (INDEPENDENT_AMBULATORY_CARE_PROVIDER_SITE_OTHER): Payer: Medicare Other | Admitting: Cardiology

## 2018-03-11 ENCOUNTER — Encounter: Payer: Self-pay | Admitting: Cardiology

## 2018-03-11 VITALS — BP 150/90 | HR 77 | Ht 60.0 in | Wt 156.8 lb

## 2018-03-11 DIAGNOSIS — I209 Angina pectoris, unspecified: Secondary | ICD-10-CM | POA: Diagnosis not present

## 2018-03-11 DIAGNOSIS — I2510 Atherosclerotic heart disease of native coronary artery without angina pectoris: Secondary | ICD-10-CM | POA: Diagnosis not present

## 2018-03-11 DIAGNOSIS — I2583 Coronary atherosclerosis due to lipid rich plaque: Secondary | ICD-10-CM

## 2018-03-11 DIAGNOSIS — I739 Peripheral vascular disease, unspecified: Secondary | ICD-10-CM | POA: Diagnosis not present

## 2018-03-11 DIAGNOSIS — I10 Essential (primary) hypertension: Secondary | ICD-10-CM

## 2018-03-11 MED ORDER — FUROSEMIDE 20 MG PO TABS: 20.0000 mg | ORAL_TABLET | Freq: Every day | ORAL | 11 refills | Status: DC | PRN

## 2018-03-11 MED ORDER — METOPROLOL SUCCINATE ER 50 MG PO TB24: ORAL_TABLET | ORAL | 3 refills | Status: DC

## 2018-03-11 MED ORDER — ATORVASTATIN CALCIUM 80 MG PO TABS: 80.0000 mg | ORAL_TABLET | Freq: Every day | ORAL | 3 refills | Status: AC

## 2018-03-11 MED ORDER — ISOSORBIDE MONONITRATE ER 60 MG PO TB24: 60.0000 mg | ORAL_TABLET | Freq: Every day | ORAL | 3 refills | Status: DC

## 2018-03-11 NOTE — Patient Instructions (Signed)
Medication Instructions:  You may take Furosemide once a day as needed for swelling. Continue all other medications as listed.  Follow-Up: Follow up in 1 year with Dr. Anne FuSkains.  You will receive a letter in the mail 2 months before you are due.  Please call us when you receive this letter to schedule your follow up appointment.  If you need a refill on your cardiac medications before your next appointment, please call your pharmacy.  Thank you for choosing [facility]!!

## 2018-03-11 NOTE — Progress Notes (Signed)
[address] [place], [place]  [address] Phone: [phone] Fax:  [phone]  Date:  03/11/2018   ID:  Crystal ColaGlenda P Costanzo, DOB 05/12/1951, MRN [id number]  PCP:  Lonie Peakonroy, Nathan, PA-C   History of Present Illness: Crystal [name] is a 67 y.o. female with coronary artery disease with history of single-vessel CABG in 2002 RIMA to RCA, occluded on cardiac catheterization on 11/22/2003 here for follow-up.  Prior to CABG she had 3 prior ostial RCA stents according to records patent by nonselective coronary angiography on 06/19/08.   Small ASD, peripheral vascular disease with patch angioplasty to right femoral artery.  Has seven kids. Stress. 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 Doing great with that. She has some atypical chest discomfort off and on. Sometimes exertional sometimes at rest. She'll states that her right inner thigh sometimes is tender to the touch but can hurt at rest or with exertion. She is not complaining of calf pain. She has previously seen Dr. Arbie CookeyEarly.  03/11/2018-overall been doing quite well, no chest pain, no anginal symptoms, no shortness of breath.  Had some difficulty getting to her appointment today, under stress.  Has her granddaughter with her.  Taking medications.  No bleeding, no syncope.    Wt Readings from Last 3 Encounters:  03/11/18 156 lb 12.8 oz (71.1 kg)  12/02/17 159 lb (72.1 kg)  11/26/17 161 lb 3.2 oz (73.1 kg)     Past Medical History:  Diagnosis Date  . Angina pectoris (HCC)   . Anxiety   . ASCVD (arteriosclerotic cardiovascular disease)    single vessel  . ASD (atrial septal defect)    Small ASD with insignificant shunt  . Back pain, chronic   . Chronic ischemic heart disease   . Claudication (HCC)   . Coronary atherosclerosis of native coronary artery   . Depression   . Diabetes mellitus without complication (HCC)   . Edema   . GERD (gastroesophageal reflux disease)   .  Hypercholesteremia   . Hypertension   . MRSA carrier    Chronic  . Occlusion and stenosis of carotid artery without mention of cerebral infarction    Last carotid doppler was in 2006. Patch angioplasty right femoral artery 01/1998, patenet 09/2005 ([facility])  . Osteoarthritis   . Osteopenia   . Other and unspecified angina pectoris   . PAD (peripheral artery disease) (HCC)   . Postsurgical aortocoronary bypass status   . Postsurgical percutaneous transluminal coronary angioplasty status   . Pseudodementia   . Right carotid bruit    Negative doppler 09/2002  . SOB (shortness of breath)    ECHO 04/13/12 EF estimated at 55-60%    Past Surgical History:  Procedure Laterality Date  . CORONARY ANGIOPLASTY WITH STENT PLACEMENT  01/15/98   PTCA stent RCA, PTCA diagonal  . CORONARY ANGIOPLASTY WITH STENT PLACEMENT  08/17/00   Cutting balloon ostial RCA  . CORONARY ANGIOPLASTY WITH STENT PLACEMENT  01/19/01   Unsuccessful attempted PTCA ostial RCA, stent protruding into root  . CORONARY ANGIOPLASTY WITH STENT PLACEMENT  01/20/01   RIMA-RCA, occluded cath 10/2003  . CORONARY ANGIOPLASTY WITH STENT PLACEMENT  02/02/04   Successful PTCA/drug eluding stent ostial RCA, [facility]  . TUBAL LIGATION      Current Outpatient Medications  Medication Sig Dispense Refill  . albuterol (PROVENTIL HFA;VENTOLIN HFA) 108 (90 BASE) MCG/ACT inhaler Inhale 2 puffs into the lungs  every 6 (six) hours as needed for wheezing.     Marland Kitchen aspirin 81 MG tablet Take 81 mg by mouth daily.    Marland Kitchen atorvastatin (LIPITOR) 80 MG tablet Take 1 tablet (80 mg total) by mouth daily. 90 tablet 3  . buPROPion (WELLBUTRIN XL) 300 MG 24 hr tablet Take 300 mg by mouth daily.    . calcium-vitamin D (OSCAL WITH D) 500-200 MG-UNIT per tablet Take 1 tablet by mouth daily. 500 mg by mouth daily    . chlorpheniramine-HYDROcodone (TUSSIONEX) 10-8 MG/5ML SUER Take 5 mLs by mouth every 12 (twelve) hours. 140 mL 0  . cholecalciferol (VITAMIN D) 400  UNITS TABS tablet Take 1,000 Units by mouth daily.    . cilostazol (PLETAL) 50 MG tablet Take 50 mg by mouth at bedtime.     . clonazePAM (KLONOPIN) 1 MG tablet Take 1 mg by mouth daily.    . DULoxetine (CYMBALTA) 60 MG capsule Take 1 capsule (60 mg total) by mouth daily. (Patient taking differently: Take 60 mg by mouth 2 (two) times daily. )    . EUCRISA 2 % OINT Apply 1 application topically daily.    . isosorbide mononitrate (IMDUR) 60 MG 24 hr tablet Take 1 tablet (60 mg total) by mouth daily. 90 tablet 3  . meclizine (ANTIVERT) 25 MG tablet Take 25 mg by mouth daily.    . metFORMIN (GLUCOPHAGE) 500 MG tablet Take 500 mg by mouth at bedtime.     . metoprolol succinate (TOPROL-XL) 50 MG 24 hr tablet Take two (2) tablet (100 mg) by mouth daily. 180 tablet 3  . nabumetone (RELAFEN) 500 MG tablet Take 1,000 mg by mouth at bedtime.     . nitroGLYCERIN (NITROSTAT) 0.4 MG SL tablet Place 1 tablet (0.4 mg total) every 5 (five) minutes as needed under the tongue for chest pain. x3 doses as needed for chest pain 25 tablet 3  . pantoprazole (PROTONIX) 40 MG tablet Take 1 tablet (40 mg total) by mouth 2 (two) times daily. 60 tablet 1  . vitamin B-12 (CYANOCOBALAMIN) 1000 MCG tablet Take 2,000 mcg by mouth daily.    . furosemide (LASIX) 20 MG tablet Take 1 tablet (20 mg total) by mouth daily as needed. 30 tablet 11   No current facility-administered medications for this visit.     Allergies:    Allergies  Allergen Reactions  . Bactrim [Sulfamethoxazole-Trimethoprim] Nausea And Vomiting  . Penicillins     Unknown   . Sulfa Antibiotics     unknown    Social History:  The patient  reports that she quit smoking about 7 years ago. Her smoking use included cigarettes. She has a 80.00 pack-year smoking history. She has never used smokeless tobacco. She reports that she does not drink alcohol or use drugs.   ROS:  Please see the history of present illness.    All other ROS neg PHYSICAL EXAM: VS:  BP  (!) 150/90 (BP Location: Left Arm, Patient Position: Sitting, Cuff Size: Normal)   Pulse 77   Ht 5' (1.524 m)   Wt 156 lb 12.8 oz (71.1 kg)   SpO2 98%   BMI 30.62 kg/m   GEN: Well nourished, well developed, in no acute distress  HEENT: normal  Neck: no JVD, carotid bruits, or masses Cardiac: RRR; no murmurs, rubs, or gallops,no edema  Respiratory:  clear to auscultation bilaterally, normal work of breathing GI: soft, nontender, nondistended, + BS MS: no deformity or atrophy  Skin: warm and  dry, no rash Neuro:  Alert and Oriented x 3, Strength and sensation are intact Psych: euthymic mood, full affect    EKG:  Today, 01/29/17-sinus bradycardia rate 57 with no other abnormalities personally viewed-prior 01/23/15-normal sinus rhythm, 62, nonspecific ST-T wave changes, borderline prolonged QT. Prior - Sinus tachycardia rate 116, possible atrial tachycardia, old inferior infarct pattern.  Labs: 06/06/14 Triglycerides 179, HDL 38, LDL 81, hemoglobin A1c 6.2, creatinine 1.31, sodium 145, potassium 3.9, hemoglobin 13.4   Cath 11/22/2003:  FINAL IMPRESSION:  1. Atherosclerotic coronary vascular disease, single-vessel.  2. Occluded right internal mammary artery to the right coronary.  3. Degree of stenosis within the stented segment of the right coronary is     uncertain at this point.  4. Intact left ventricular size and global systolic function. 5. No significant left coronary artery disease.  11/04/2013: LE Doppler Mildly reduced flow, right ABI. Overall no need for further intervention or evaluation. Continue with exercise. Overall reassuring.  01/25/16: Carotid Doppler Heterogeneous plaque, bilaterally. 1-39% bilateral ICA stenosis. Elevated velocities in the right subclavian artery. Normal left subclavian artery. Patent vertebral arteries with antegrade flow. Mild carotid plaque noted. Continue with current therapy.  ASSESSMENT AND PLAN:  Coronary artery disease-status post  bypass-single vessel which is occluded-RIMA to RCA, prior RCA stents. Dr. Amil Amen prior cardiac catheterization reviewed.  Blood pressure is elevated however she is stressed because she was late for the appointment.  Angina, currently stable. Isosorbide, Toprol. Atypical chest pain off and on. Multiple somatic complaints previously.  Still doing well.  No chest pain.  ASD - small atrial septal defect corrected with patch during bypass. Stable.  Former smoker-good job with stopping tobacco. No changes made.  She continues with tobacco cessation.  Prior Obesity-good job with weight loss.  Weight does seem to fluctuate however.  Some of this is fluid.  We will give her Lasix 20 mg daily as needed.  PVD-currently on cilostazol/Pletal, ASA 81. She was on this prior to seeing me. I have continued this drug regimen. In 2015-Doppler showed Mild stenosis in the right common femoral artery, diffuse non-hemodynamically significant disease through superficial femoral arteries bilaterally. 3 vessel runoff bilaterally. Right ABI 0.86, left 0.92. Continue to encourage exercise. Given her right inner thigh discomfort, I will refer her back to Dr. Arbie Cookey.  No bleeding. Perhaps some of her numbness at times is more neuropathic. Overall, no significant change. I do feel like I am able to palpate her Dorsalis pedis.  Still doing quite well.  No major issues currently.   Carotid bruits bilaterally-stable mild carotid plaque, continue with secondary prevention efforts.  Hyperlipidemia-currently on atorvastatin. Continue for plaque stability, no myalgias.  Still doing well.  Medications reviewed.  12 month f/u  Signed, Donato Schultz, MD [facility]  03/11/2018 12:53 PM

## 2018-06-02 ENCOUNTER — Inpatient Hospital Stay: Admission: RE | Admit: 2018-06-02 | Payer: Medicare Other | Source: Ambulatory Visit

## 2018-06-02 ENCOUNTER — Encounter: Payer: Medicare Other | Admitting: Acute Care

## 2018-06-23 IMAGING — CR DG CHEST 2V
2 series · 2 of 2 positions shown · non-contrast
Comparison: 12/09/2012

CLINICAL DATA: Productive cough

EXAM:
CHEST  2 VIEW

[chest pa]
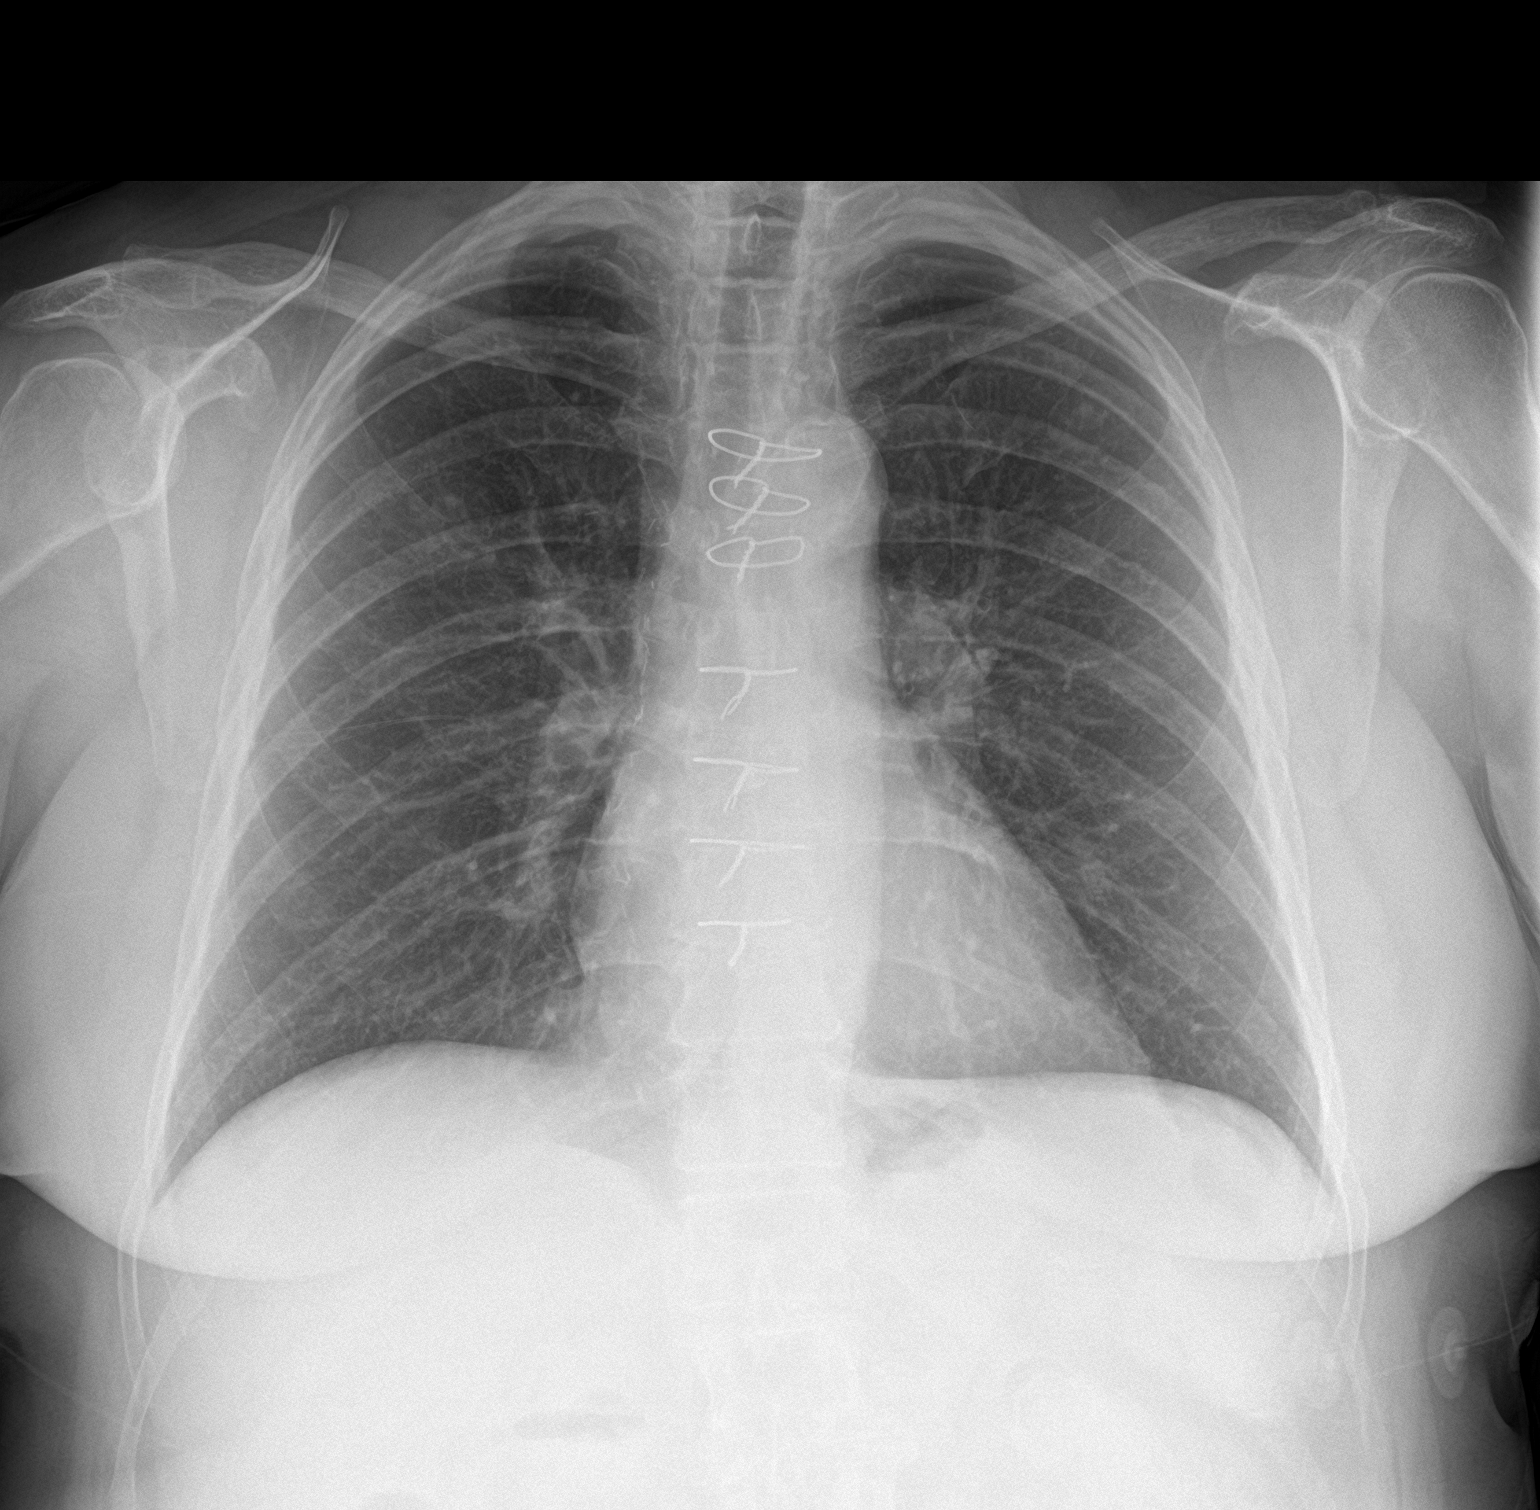

[chest lat]
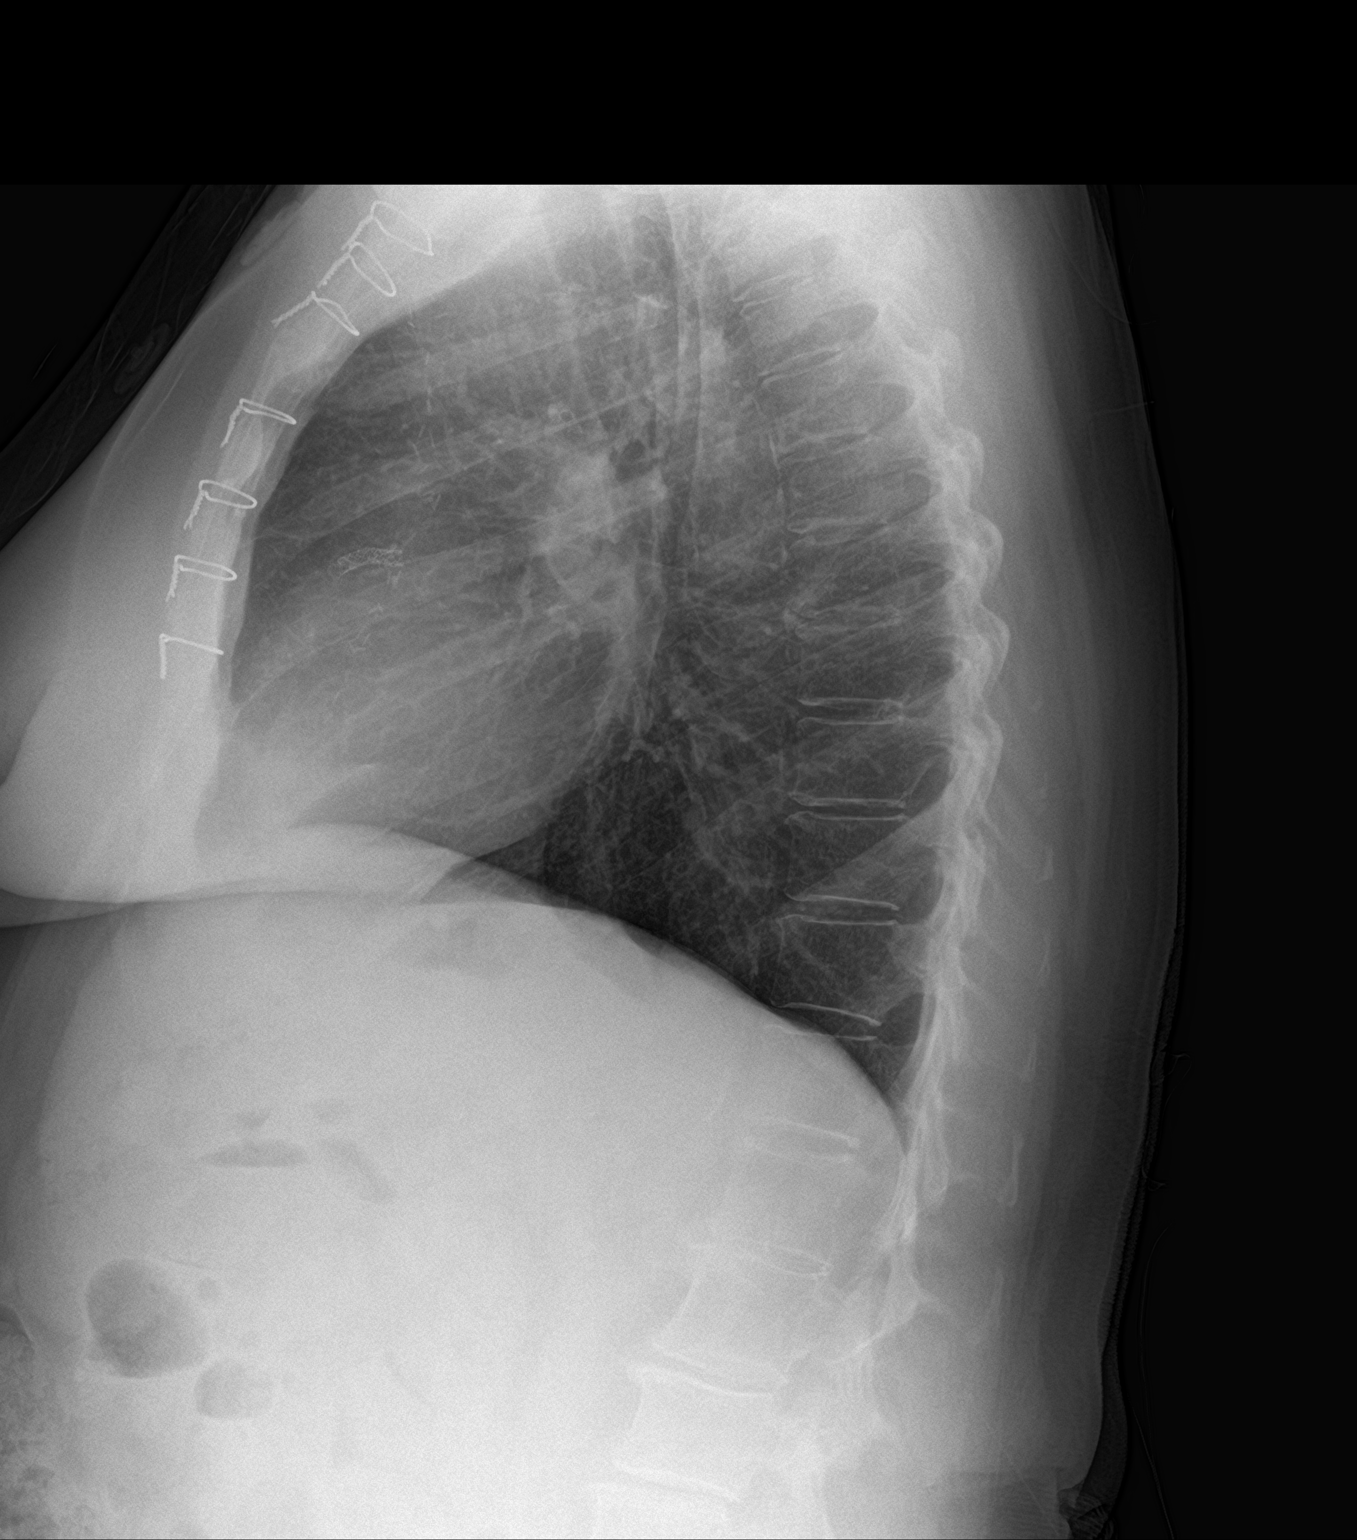

[2 of 2 positions shown; findings below may reference images not displayed]

FINDINGS: Post sternotomy changes. No focal consolidation or effusion.
Cardiomediastinal silhouette within normal limits. Aortic
atherosclerosis. No pneumothorax.
IMPRESSION: No active cardiopulmonary disease.

## 2018-09-30 DIAGNOSIS — M109 Gout, unspecified: Secondary | ICD-10-CM | POA: Diagnosis not present

## 2018-09-30 DIAGNOSIS — K529 Noninfective gastroenteritis and colitis, unspecified: Secondary | ICD-10-CM | POA: Diagnosis not present

## 2018-09-30 DIAGNOSIS — M545 Low back pain: Secondary | ICD-10-CM | POA: Diagnosis not present

## 2018-11-10 DIAGNOSIS — I700 Atherosclerosis of aorta: Secondary | ICD-10-CM | POA: Diagnosis not present

## 2018-11-10 DIAGNOSIS — J45909 Unspecified asthma, uncomplicated: Secondary | ICD-10-CM | POA: Diagnosis not present

## 2018-11-10 DIAGNOSIS — S40011A Contusion of right shoulder, initial encounter: Secondary | ICD-10-CM | POA: Diagnosis not present

## 2018-11-10 DIAGNOSIS — M25511 Pain in right shoulder: Secondary | ICD-10-CM | POA: Diagnosis not present

## 2018-11-10 DIAGNOSIS — Z951 Presence of aortocoronary bypass graft: Secondary | ICD-10-CM | POA: Diagnosis not present

## 2018-11-10 DIAGNOSIS — E1151 Type 2 diabetes mellitus with diabetic peripheral angiopathy without gangrene: Secondary | ICD-10-CM | POA: Diagnosis not present

## 2018-11-10 DIAGNOSIS — I10 Essential (primary) hypertension: Secondary | ICD-10-CM | POA: Diagnosis not present

## 2018-11-10 DIAGNOSIS — S20211A Contusion of right front wall of thorax, initial encounter: Secondary | ICD-10-CM | POA: Diagnosis not present

## 2018-11-10 DIAGNOSIS — I4581 Long QT syndrome: Secondary | ICD-10-CM | POA: Diagnosis not present

## 2018-11-10 DIAGNOSIS — E876 Hypokalemia: Secondary | ICD-10-CM | POA: Diagnosis not present

## 2018-11-10 DIAGNOSIS — R51 Headache: Secondary | ICD-10-CM | POA: Diagnosis not present

## 2018-11-10 DIAGNOSIS — E785 Hyperlipidemia, unspecified: Secondary | ICD-10-CM | POA: Diagnosis not present

## 2018-11-10 DIAGNOSIS — R0789 Other chest pain: Secondary | ICD-10-CM | POA: Diagnosis not present

## 2018-11-10 DIAGNOSIS — Z87891 Personal history of nicotine dependence: Secondary | ICD-10-CM | POA: Diagnosis not present

## 2018-11-10 DIAGNOSIS — R0781 Pleurodynia: Secondary | ICD-10-CM | POA: Diagnosis not present

## 2018-12-28 DIAGNOSIS — I10 Essential (primary) hypertension: Secondary | ICD-10-CM | POA: Diagnosis not present

## 2018-12-28 DIAGNOSIS — E119 Type 2 diabetes mellitus without complications: Secondary | ICD-10-CM | POA: Diagnosis not present

## 2018-12-28 DIAGNOSIS — I739 Peripheral vascular disease, unspecified: Secondary | ICD-10-CM | POA: Diagnosis not present

## 2018-12-28 DIAGNOSIS — I259 Chronic ischemic heart disease, unspecified: Secondary | ICD-10-CM | POA: Diagnosis not present

## 2019-02-25 DIAGNOSIS — Z79899 Other long term (current) drug therapy: Secondary | ICD-10-CM | POA: Diagnosis not present

## 2019-02-25 DIAGNOSIS — I259 Chronic ischemic heart disease, unspecified: Secondary | ICD-10-CM | POA: Diagnosis not present

## 2019-02-25 DIAGNOSIS — E7800 Pure hypercholesterolemia, unspecified: Secondary | ICD-10-CM | POA: Diagnosis not present

## 2019-02-25 DIAGNOSIS — Z139 Encounter for screening, unspecified: Secondary | ICD-10-CM | POA: Diagnosis not present

## 2019-02-25 DIAGNOSIS — I10 Essential (primary) hypertension: Secondary | ICD-10-CM | POA: Diagnosis not present

## 2019-02-25 DIAGNOSIS — M109 Gout, unspecified: Secondary | ICD-10-CM | POA: Diagnosis not present

## 2019-02-25 DIAGNOSIS — I739 Peripheral vascular disease, unspecified: Secondary | ICD-10-CM | POA: Diagnosis not present

## 2019-02-25 DIAGNOSIS — Z9181 History of falling: Secondary | ICD-10-CM | POA: Diagnosis not present

## 2019-02-25 DIAGNOSIS — E119 Type 2 diabetes mellitus without complications: Secondary | ICD-10-CM | POA: Diagnosis not present

## 2019-03-17 DIAGNOSIS — N289 Disorder of kidney and ureter, unspecified: Secondary | ICD-10-CM | POA: Diagnosis not present

## 2019-04-15 ENCOUNTER — Other Ambulatory Visit: Payer: Self-pay | Admitting: Cardiology

## 2019-04-15 MED ORDER — NITROGLYCERIN 0.4 MG SL SUBL: 0.4000 mg | SUBLINGUAL_TABLET | SUBLINGUAL | 0 refills | Status: DC | PRN

## 2019-05-26 ENCOUNTER — Other Ambulatory Visit: Payer: Self-pay | Admitting: Cardiology

## 2019-06-17 DIAGNOSIS — N760 Acute vaginitis: Secondary | ICD-10-CM | POA: Diagnosis not present

## 2019-06-22 ENCOUNTER — Other Ambulatory Visit: Payer: Self-pay | Admitting: Cardiology

## 2019-07-21 ENCOUNTER — Other Ambulatory Visit: Payer: Self-pay | Admitting: Cardiology

## 2019-07-30 ENCOUNTER — Other Ambulatory Visit: Payer: Self-pay | Admitting: Cardiology

## 2019-08-18 ENCOUNTER — Other Ambulatory Visit: Payer: Self-pay | Admitting: Cardiology

## 2019-08-30 DIAGNOSIS — I10 Essential (primary) hypertension: Secondary | ICD-10-CM | POA: Diagnosis not present

## 2019-08-30 DIAGNOSIS — I259 Chronic ischemic heart disease, unspecified: Secondary | ICD-10-CM | POA: Diagnosis not present

## 2019-08-30 DIAGNOSIS — E7800 Pure hypercholesterolemia, unspecified: Secondary | ICD-10-CM | POA: Diagnosis not present

## 2019-08-30 DIAGNOSIS — Z79899 Other long term (current) drug therapy: Secondary | ICD-10-CM | POA: Diagnosis not present

## 2019-08-30 DIAGNOSIS — M109 Gout, unspecified: Secondary | ICD-10-CM | POA: Diagnosis not present

## 2019-08-30 DIAGNOSIS — E119 Type 2 diabetes mellitus without complications: Secondary | ICD-10-CM | POA: Diagnosis not present

## 2019-09-01 ENCOUNTER — Other Ambulatory Visit: Payer: Self-pay

## 2019-09-01 ENCOUNTER — Encounter: Payer: Self-pay | Admitting: Cardiology

## 2019-09-01 ENCOUNTER — Ambulatory Visit (INDEPENDENT_AMBULATORY_CARE_PROVIDER_SITE_OTHER): Payer: Medicare Other | Admitting: Cardiology

## 2019-09-01 VITALS — BP 124/60 | HR 62 | Ht 60.0 in | Wt 156.8 lb

## 2019-09-01 DIAGNOSIS — I209 Angina pectoris, unspecified: Secondary | ICD-10-CM | POA: Diagnosis not present

## 2019-09-01 DIAGNOSIS — I2510 Atherosclerotic heart disease of native coronary artery without angina pectoris: Secondary | ICD-10-CM | POA: Diagnosis not present

## 2019-09-01 DIAGNOSIS — I739 Peripheral vascular disease, unspecified: Secondary | ICD-10-CM | POA: Diagnosis not present

## 2019-09-01 DIAGNOSIS — I2583 Coronary atherosclerosis due to lipid rich plaque: Secondary | ICD-10-CM | POA: Diagnosis not present

## 2019-09-01 DIAGNOSIS — I10 Essential (primary) hypertension: Secondary | ICD-10-CM

## 2019-09-01 NOTE — Progress Notes (Signed)
[address] [place], [place]  [id number] Phone: [phone] Fax:  [phone]  Date:  09/01/2019   ID:  Crystal ColaGlenda P Swendsen, DOB 07/06/1951, MRN [id number]  PCP:  Lonie Peakonroy, Nathan, PA-C   History of Present Illness: Crystal [name] is a 68 y.o. female with coronary artery disease with history of single-vessel CABG in 2002 RIMA to RCA, occluded on cardiac catheterization on 11/22/2003 here for follow-up.  Prior to CABG she had 3 prior ostial RCA stents according to records patent by nonselective coronary angiography on 06/19/08.   Small ASD, peripheral vascular disease with patch angioplasty to right femoral artery.  Has seven kids. Stress. 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 Doing great with that. She has some atypical chest discomfort off and on. Sometimes exertional sometimes at rest. She'll states that her right inner thigh sometimes is tender to the touch but can hurt at rest or with exertion. She is not complaining of calf pain. She has previously seen Dr. Arbie CookeyEarly.  03/11/2018-overall been doing quite well, no chest pain, no anginal symptoms, no shortness of breath.  Had some difficulty getting to her appointment today, under stress.  Has her granddaughter with her.  Taking medications.  No bleeding, no syncope.  09/01/2019-here for the follow-up of CAD.  Overall doing quite well.  Denies any fevers chills nausea vomiting syncope bleeding. Minimal angina with exertion.  Still has some lower extremity discomfort with exertion however her last ABIs in 2018 were reassuring.  Compliant with medications.   Wt Readings from Last 3 Encounters:  09/01/19 156 lb 12.8 oz (71.1 kg)  03/11/18 156 lb 12.8 oz (71.1 kg)  12/02/17 159 lb (72.1 kg)     Past Medical History:  Diagnosis Date  . Angina pectoris (HCC)   . Anxiety   . ASCVD (arteriosclerotic cardiovascular disease)    single vessel  . ASD (atrial septal defect)    Small ASD with  insignificant shunt  . Back pain, chronic   . Chronic ischemic heart disease   . Claudication (HCC)   . Coronary atherosclerosis of native coronary artery   . Depression   . Diabetes mellitus without complication (HCC)   . Edema   . GERD (gastroesophageal reflux disease)   . Hypercholesteremia   . Hypertension   . MRSA carrier    Chronic  . Occlusion and stenosis of carotid artery without mention of cerebral infarction    Last carotid doppler was in 2006. Patch angioplasty right femoral artery 01/1998, patenet 09/2005 ([facility])  . Osteoarthritis   . Osteopenia   . Other and unspecified angina pectoris   . PAD (peripheral artery disease) (HCC)   . Postsurgical aortocoronary bypass status   . Postsurgical percutaneous transluminal coronary angioplasty status   . Pseudodementia   . Right carotid bruit    Negative doppler 09/2002  . SOB (shortness of breath)    ECHO 04/13/12 EF estimated at 55-60%    Past Surgical History:  Procedure Laterality Date  . CORONARY ANGIOPLASTY WITH STENT PLACEMENT  01/15/98   PTCA stent RCA, PTCA diagonal  . CORONARY ANGIOPLASTY WITH STENT PLACEMENT  08/17/00   Cutting balloon ostial RCA  . CORONARY ANGIOPLASTY WITH STENT PLACEMENT  01/19/01   Unsuccessful attempted PTCA ostial RCA, stent protruding into root  . CORONARY ANGIOPLASTY WITH STENT PLACEMENT  01/20/01   RIMA-RCA, occluded cath 10/2003  . CORONARY ANGIOPLASTY WITH STENT PLACEMENT  02/02/04   Successful PTCA/drug eluding stent ostial RCA, [facility]  . TUBAL LIGATION      Current Outpatient Medications  Medication Sig Dispense Refill  . albuterol (PROVENTIL HFA;VENTOLIN HFA) 108 (90 BASE) MCG/ACT inhaler Inhale 2 puffs into the lungs every 6 (six) hours as needed for wheezing.     Marland Kitchen allopurinol (ZYLOPRIM) 300 MG tablet Take 300 mg by mouth daily.    Marland Kitchen aspirin 81 MG tablet Take 81 mg by mouth daily.    Marland Kitchen atorvastatin (LIPITOR) 80 MG tablet Take 1 tablet (80 mg total) by mouth daily. 90 tablet  3  . buPROPion (WELLBUTRIN XL) 300 MG 24 hr tablet Take 300 mg by mouth daily.    . calcium-vitamin D (OSCAL WITH D) 500-200 MG-UNIT per tablet Take 1 tablet by mouth daily. 500 mg by mouth daily    . chlorpheniramine-HYDROcodone (TUSSIONEX) 10-8 MG/5ML SUER Take 5 mLs by mouth every 12 (twelve) hours. 140 mL 0  . cholecalciferol (VITAMIN D) 400 UNITS TABS tablet Take 1,000 Units by mouth daily.    . cilostazol (PLETAL) 50 MG tablet Take 50 mg by mouth at bedtime.     . clonazePAM (KLONOPIN) 1 MG tablet Take 1 mg by mouth daily.    . DULoxetine (CYMBALTA) 60 MG capsule Take 1 capsule (60 mg total) by mouth daily.    . EUCRISA 2 % OINT Apply 1 application topically daily.    . famotidine (PEPCID) 40 MG tablet Take 40 mg by mouth daily.    . furosemide (LASIX) 20 MG tablet Take 1 tablet (20 mg total) by mouth daily as needed. Please make overdue appt with Dr. Marlou Porch before anymore refills. 2nd attempt 15 tablet 0  . isosorbide mononitrate (IMDUR) 60 MG 24 hr tablet Take 1 tablet (60 mg total) by mouth daily. 90 tablet 3  . meclizine (ANTIVERT) 25 MG tablet Take 25 mg by mouth daily.    . meloxicam (MOBIC) 7.5 MG tablet Take 1 tablet by mouth daily as needed.    . metoprolol succinate (TOPROL-XL) 50 MG 24 hr tablet Take two (2) tablet (100 mg) by mouth daily. 180 tablet 3  . nabumetone (RELAFEN) 500 MG tablet Take 1,000 mg by mouth at bedtime.     . nitroGLYCERIN (NITROSTAT) 0.4 MG SL tablet Place 1 tablet (0.4 mg total) under the tongue every 5 (five) minutes as needed for chest pain. x3 doses as needed for chest pain. Please make overdue appt. 1st attempt 25 tablet 0  . pantoprazole (PROTONIX) 40 MG tablet Take 1 tablet (40 mg total) by mouth 2 (two) times daily. 60 tablet 1  . vitamin B-12 (CYANOCOBALAMIN) 1000 MCG tablet Take 2,000 mcg by mouth daily.     No current facility-administered medications for this visit.     Allergies:    Allergies  Allergen Reactions  . Bactrim  [Sulfamethoxazole-Trimethoprim] Nausea And Vomiting  . Penicillins     Unknown   . Sulfa Antibiotics     unknown    Social History:  The patient  reports that she quit smoking about 8 years ago. Her smoking use included cigarettes. She has a 80.00 pack-year smoking history. She has never used smokeless tobacco. She reports that she does not drink alcohol or use drugs.   ROS:  Please see the history of present illness.    All other ROS neg PHYSICAL EXAM: VS:  BP 124/60   Pulse 62   Ht 5' (1.524 m)   Wt 156 lb 12.8  oz (71.1 kg)   SpO2 96%   BMI 30.62 kg/m   GEN: Well nourished, well developed, in no acute distress  HEENT: normal  Neck: no JVD, carotid bruits, or masses Cardiac: RRR; no murmurs, rubs, or gallops,no edema  Respiratory:  clear to auscultation bilaterally, normal work of breathing GI: soft, nontender, nondistended, + BS MS: no deformity or atrophy  Skin: warm and dry, no rash Neuro:  Alert and Oriented x 3, Strength and sensation are intact Psych: euthymic mood, full affect   EKG:  Today, 09/01/2019-sinus rhythm 62 nonspecific ST changes.  01/29/17-sinus bradycardia rate 57 with no other abnormalities personally viewed-prior 01/23/15-normal sinus rhythm, 62, nonspecific ST-T wave changes, borderline prolonged QT. Prior - Sinus tachycardia rate 116, possible atrial tachycardia, old inferior infarct pattern.  Labs: 06/06/14 Triglycerides 179, HDL 38, LDL 81, hemoglobin A1c 6.2, creatinine 1.31, sodium 145, potassium 3.9, hemoglobin 13.4  Cath 11/22/2003:  FINAL IMPRESSION:  1. Atherosclerotic coronary vascular disease, single-vessel.  2. Occluded right internal mammary artery to the right coronary.  3. Degree of stenosis within the stented segment of the right coronary is     uncertain at this point.  4. Intact left ventricular size and global systolic function. 5. No significant left coronary artery disease.  11/04/2013: LE Doppler Mildly reduced flow, right ABI. Overall  no need for further intervention or evaluation. Continue with exercise. Overall reassuring.  01/25/16: Carotid Doppler Heterogeneous plaque, bilaterally. 1-39% bilateral ICA stenosis. Elevated velocities in the right subclavian artery. Normal left subclavian artery. Patent vertebral arteries with antegrade flow. Mild carotid plaque noted. Continue with current therapy.  ASSESSMENT AND PLAN:  Coronary artery disease-status post bypass-single vessel which is occluded-RIMA to RCA, prior RCA stents. Dr. Amil Amen prior cardiac catheterization reviewed.    Angina, currently stable. Isosorbide, Toprol. Atypical chest pain off and on. Multiple somatic complaints previously.  She will have an occasional anginal symptom.  She is taking isosorbide.  ASD - small atrial septal defect corrected with patch during bypass.  Should be of no clinical concern at this point  Former smoker-good job with stopping tobacco. No changes made.  She continues with tobacco cessation.  Excellent.  Prior Obesity-good job with weight loss.  Weight does seem to fluctuate however.  Some of this is fluid.  We will give her Lasix 20 mg daily as needed.  Rarely utilizes  PVD-currently on cilostazol/Pletal, ASA 81. She was on this prior to seeing me. I have continued this drug regimen. In 2015-Doppler showed Mild stenosis in the right common femoral artery, diffuse non-hemodynamically significant disease through superficial femoral arteries bilaterally. 3 vessel runoff bilaterally. Right ABI 0.86, left 0.92. Continue to encourage exercise. Dr. Arbie Cookey has seen back in 2018, ABIs were actually normal.  No bleeding. Perhaps some of her numbness at times is more neuropathic.   Still doing quite well.  No major issues currently.    Carotid bruits bilaterally-stable mild carotid plaque, continue with secondary prevention efforts.  Continue with aggressive risk factor modification  Hyperlipidemia-currently on atorvastatin. Continue for  plaque stability.  No myalgias continuing to do well.  No changes made  12 month f/u  Signed, Donato Schultz, MD [facility]  09/01/2019 3:53 PM

## 2019-09-01 NOTE — Patient Instructions (Signed)
Medication Instructions:  The current medical regimen is effective;  continue present plan and medications.  *If you need a refill on your cardiac medications before your next appointment, please call your pharmacy*  Follow-Up: At [facility], you and your health needs are our priority.  As part of our continuing mission to provide you with exceptional heart care, we have created designated Provider Care Teams.  These Care Teams include your primary Cardiologist (physician) and Advanced Practice Providers (APPs -  Physician Assistants and Nurse Practitioners) who all work together to provide you with the care you need, when you need it.  Your next appointment:   12 month(s)  The format for your next appointment:   In Person  Provider:   Mark Skains, MD   Thank you for choosing [facility]!!     

## 2019-10-13 ENCOUNTER — Other Ambulatory Visit: Payer: Self-pay | Admitting: Cardiology

## 2019-12-02 DIAGNOSIS — H5213 Myopia, bilateral: Secondary | ICD-10-CM | POA: Diagnosis not present

## 2019-12-19 DIAGNOSIS — E785 Hyperlipidemia, unspecified: Secondary | ICD-10-CM | POA: Diagnosis not present

## 2019-12-19 DIAGNOSIS — Z0000 Encounter for general adult medical examination without abnormal findings: Secondary | ICD-10-CM | POA: Diagnosis not present

## 2019-12-19 DIAGNOSIS — Z9181 History of falling: Secondary | ICD-10-CM | POA: Diagnosis not present

## 2020-01-23 DIAGNOSIS — H524 Presbyopia: Secondary | ICD-10-CM | POA: Diagnosis not present

## 2020-02-27 ENCOUNTER — Other Ambulatory Visit: Payer: Self-pay | Admitting: Cardiology

## 2020-03-08 DIAGNOSIS — J069 Acute upper respiratory infection, unspecified: Secondary | ICD-10-CM | POA: Diagnosis not present

## 2020-03-08 DIAGNOSIS — Z20822 Contact with and (suspected) exposure to covid-19: Secondary | ICD-10-CM | POA: Diagnosis not present

## 2020-03-15 DIAGNOSIS — Z139 Encounter for screening, unspecified: Secondary | ICD-10-CM | POA: Diagnosis not present

## 2020-03-15 DIAGNOSIS — I10 Essential (primary) hypertension: Secondary | ICD-10-CM | POA: Diagnosis not present

## 2020-03-15 DIAGNOSIS — E7800 Pure hypercholesterolemia, unspecified: Secondary | ICD-10-CM | POA: Diagnosis not present

## 2020-03-15 DIAGNOSIS — I259 Chronic ischemic heart disease, unspecified: Secondary | ICD-10-CM | POA: Diagnosis not present

## 2020-03-15 DIAGNOSIS — E119 Type 2 diabetes mellitus without complications: Secondary | ICD-10-CM | POA: Diagnosis not present

## 2020-03-15 DIAGNOSIS — Z79899 Other long term (current) drug therapy: Secondary | ICD-10-CM | POA: Diagnosis not present

## 2020-03-15 DIAGNOSIS — M109 Gout, unspecified: Secondary | ICD-10-CM | POA: Diagnosis not present

## 2020-03-24 DIAGNOSIS — K209 Esophagitis, unspecified without bleeding: Secondary | ICD-10-CM | POA: Diagnosis not present

## 2020-03-24 DIAGNOSIS — I10 Essential (primary) hypertension: Secondary | ICD-10-CM | POA: Diagnosis not present

## 2020-03-24 DIAGNOSIS — I700 Atherosclerosis of aorta: Secondary | ICD-10-CM | POA: Diagnosis not present

## 2020-03-24 DIAGNOSIS — Z951 Presence of aortocoronary bypass graft: Secondary | ICD-10-CM | POA: Diagnosis not present

## 2020-03-24 DIAGNOSIS — E785 Hyperlipidemia, unspecified: Secondary | ICD-10-CM | POA: Diagnosis not present

## 2020-03-24 DIAGNOSIS — R05 Cough: Secondary | ICD-10-CM | POA: Diagnosis not present

## 2020-03-24 DIAGNOSIS — K449 Diaphragmatic hernia without obstruction or gangrene: Secondary | ICD-10-CM | POA: Diagnosis not present

## 2020-03-24 DIAGNOSIS — R1310 Dysphagia, unspecified: Secondary | ICD-10-CM | POA: Diagnosis not present

## 2020-03-24 DIAGNOSIS — R1013 Epigastric pain: Secondary | ICD-10-CM | POA: Diagnosis not present

## 2020-03-24 DIAGNOSIS — R072 Precordial pain: Secondary | ICD-10-CM | POA: Diagnosis not present

## 2020-03-24 DIAGNOSIS — I2510 Atherosclerotic heart disease of native coronary artery without angina pectoris: Secondary | ICD-10-CM | POA: Diagnosis not present

## 2020-05-11 DIAGNOSIS — N390 Urinary tract infection, site not specified: Secondary | ICD-10-CM | POA: Diagnosis not present

## 2020-06-26 DIAGNOSIS — R531 Weakness: Secondary | ICD-10-CM | POA: Diagnosis not present

## 2020-06-26 DIAGNOSIS — J441 Chronic obstructive pulmonary disease with (acute) exacerbation: Secondary | ICD-10-CM | POA: Diagnosis not present

## 2020-06-26 DIAGNOSIS — Z20822 Contact with and (suspected) exposure to covid-19: Secondary | ICD-10-CM | POA: Diagnosis not present

## 2020-06-30 ENCOUNTER — Inpatient Hospital Stay (HOSPITAL_COMMUNITY)
Admission: AD | Admit: 2020-06-30 | Discharge: 2020-07-03 | DRG: 281 | Disposition: A | Discharge status: Home / Self Care | Payer: Medicare Other | Source: Other Acute Inpatient Hospital | Attending: Cardiology | Admitting: Cardiology

## 2020-06-30 DIAGNOSIS — Z7902 Long term (current) use of antithrombotics/antiplatelets: Secondary | ICD-10-CM

## 2020-06-30 DIAGNOSIS — R7989 Other specified abnormal findings of blood chemistry: Secondary | ICD-10-CM | POA: Diagnosis not present

## 2020-06-30 DIAGNOSIS — R079 Chest pain, unspecified: Secondary | ICD-10-CM | POA: Diagnosis not present

## 2020-06-30 DIAGNOSIS — R0789 Other chest pain: Secondary | ICD-10-CM | POA: Diagnosis not present

## 2020-06-30 DIAGNOSIS — E782 Mixed hyperlipidemia: Secondary | ICD-10-CM | POA: Diagnosis present

## 2020-06-30 DIAGNOSIS — Z7984 Long term (current) use of oral hypoglycemic drugs: Secondary | ICD-10-CM | POA: Diagnosis not present

## 2020-06-30 DIAGNOSIS — Z720 Tobacco use: Secondary | ICD-10-CM | POA: Diagnosis not present

## 2020-06-30 DIAGNOSIS — J431 Panlobular emphysema: Secondary | ICD-10-CM | POA: Diagnosis not present

## 2020-06-30 DIAGNOSIS — E1151 Type 2 diabetes mellitus with diabetic peripheral angiopathy without gangrene: Secondary | ICD-10-CM | POA: Diagnosis not present

## 2020-06-30 DIAGNOSIS — Z951 Presence of aortocoronary bypass graft: Secondary | ICD-10-CM | POA: Diagnosis not present

## 2020-06-30 DIAGNOSIS — N170 Acute kidney failure with tubular necrosis: Secondary | ICD-10-CM | POA: Diagnosis not present

## 2020-06-30 DIAGNOSIS — R0989 Other specified symptoms and signs involving the circulatory and respiratory systems: Secondary | ICD-10-CM | POA: Diagnosis not present

## 2020-06-30 DIAGNOSIS — E785 Hyperlipidemia, unspecified: Secondary | ICD-10-CM

## 2020-06-30 DIAGNOSIS — I214 Non-ST elevation (NSTEMI) myocardial infarction: Principal | ICD-10-CM | POA: Diagnosis present

## 2020-06-30 DIAGNOSIS — E875 Hyperkalemia: Secondary | ICD-10-CM | POA: Diagnosis not present

## 2020-06-30 DIAGNOSIS — F32A Depression, unspecified: Secondary | ICD-10-CM | POA: Diagnosis present

## 2020-06-30 DIAGNOSIS — I10 Essential (primary) hypertension: Secondary | ICD-10-CM | POA: Diagnosis present

## 2020-06-30 DIAGNOSIS — Z79899 Other long term (current) drug therapy: Secondary | ICD-10-CM | POA: Diagnosis not present

## 2020-06-30 DIAGNOSIS — Z87891 Personal history of nicotine dependence: Secondary | ICD-10-CM | POA: Diagnosis not present

## 2020-06-30 DIAGNOSIS — N1832 Chronic kidney disease, stage 3b: Secondary | ICD-10-CM | POA: Diagnosis present

## 2020-06-30 DIAGNOSIS — N189 Chronic kidney disease, unspecified: Secondary | ICD-10-CM | POA: Diagnosis present

## 2020-06-30 DIAGNOSIS — I5181 Takotsubo syndrome: Secondary | ICD-10-CM | POA: Diagnosis present

## 2020-06-30 DIAGNOSIS — K219 Gastro-esophageal reflux disease without esophagitis: Secondary | ICD-10-CM | POA: Diagnosis present

## 2020-06-30 DIAGNOSIS — E118 Type 2 diabetes mellitus with unspecified complications: Secondary | ICD-10-CM

## 2020-06-30 DIAGNOSIS — I129 Hypertensive chronic kidney disease with stage 1 through stage 4 chronic kidney disease, or unspecified chronic kidney disease: Secondary | ICD-10-CM | POA: Diagnosis present

## 2020-06-30 DIAGNOSIS — I25119 Atherosclerotic heart disease of native coronary artery with unspecified angina pectoris: Secondary | ICD-10-CM | POA: Diagnosis present

## 2020-06-30 DIAGNOSIS — I2510 Atherosclerotic heart disease of native coronary artery without angina pectoris: Secondary | ICD-10-CM | POA: Diagnosis present

## 2020-06-30 DIAGNOSIS — I248 Other forms of acute ischemic heart disease: Secondary | ICD-10-CM | POA: Diagnosis not present

## 2020-06-30 DIAGNOSIS — N182 Chronic kidney disease, stage 2 (mild): Secondary | ICD-10-CM | POA: Diagnosis not present

## 2020-06-30 DIAGNOSIS — M1990 Unspecified osteoarthritis, unspecified site: Secondary | ICD-10-CM | POA: Diagnosis present

## 2020-06-30 DIAGNOSIS — K449 Diaphragmatic hernia without obstruction or gangrene: Secondary | ICD-10-CM | POA: Diagnosis not present

## 2020-06-30 DIAGNOSIS — I700 Atherosclerosis of aorta: Secondary | ICD-10-CM | POA: Diagnosis not present

## 2020-06-30 DIAGNOSIS — Z791 Long term (current) use of non-steroidal anti-inflammatories (NSAID): Secondary | ICD-10-CM

## 2020-06-30 DIAGNOSIS — Z7982 Long term (current) use of aspirin: Secondary | ICD-10-CM | POA: Diagnosis not present

## 2020-06-30 DIAGNOSIS — R0902 Hypoxemia: Secondary | ICD-10-CM | POA: Diagnosis not present

## 2020-06-30 DIAGNOSIS — Z8249 Family history of ischemic heart disease and other diseases of the circulatory system: Secondary | ICD-10-CM

## 2020-06-30 DIAGNOSIS — Z880 Allergy status to penicillin: Secondary | ICD-10-CM

## 2020-06-30 DIAGNOSIS — F419 Anxiety disorder, unspecified: Secondary | ICD-10-CM | POA: Diagnosis present

## 2020-06-30 DIAGNOSIS — G8929 Other chronic pain: Secondary | ICD-10-CM | POA: Diagnosis present

## 2020-06-30 DIAGNOSIS — E7800 Pure hypercholesterolemia, unspecified: Secondary | ICD-10-CM | POA: Diagnosis not present

## 2020-06-30 DIAGNOSIS — E119 Type 2 diabetes mellitus without complications: Secondary | ICD-10-CM | POA: Diagnosis not present

## 2020-06-30 DIAGNOSIS — Z743 Need for continuous supervision: Secondary | ICD-10-CM | POA: Diagnosis not present

## 2020-06-30 DIAGNOSIS — I739 Peripheral vascular disease, unspecified: Secondary | ICD-10-CM | POA: Diagnosis present

## 2020-06-30 DIAGNOSIS — Z881 Allergy status to other antibiotic agents status: Secondary | ICD-10-CM

## 2020-06-30 DIAGNOSIS — J449 Chronic obstructive pulmonary disease, unspecified: Secondary | ICD-10-CM | POA: Diagnosis not present

## 2020-06-30 DIAGNOSIS — T82855A Stenosis of coronary artery stent, initial encounter: Secondary | ICD-10-CM | POA: Diagnosis not present

## 2020-06-30 DIAGNOSIS — N179 Acute kidney failure, unspecified: Secondary | ICD-10-CM | POA: Diagnosis not present

## 2020-06-30 DIAGNOSIS — M549 Dorsalgia, unspecified: Secondary | ICD-10-CM | POA: Diagnosis not present

## 2020-06-30 DIAGNOSIS — M109 Gout, unspecified: Secondary | ICD-10-CM | POA: Diagnosis not present

## 2020-06-30 DIAGNOSIS — I2489 Other forms of acute ischemic heart disease: Secondary | ICD-10-CM

## 2020-06-30 DIAGNOSIS — Z882 Allergy status to sulfonamides status: Secondary | ICD-10-CM

## 2020-06-30 DIAGNOSIS — Y831 Surgical operation with implant of artificial internal device as the cause of abnormal reaction of the patient, or of later complication, without mention of misadventure at the time of the procedure: Secondary | ICD-10-CM | POA: Diagnosis present

## 2020-06-30 DIAGNOSIS — E1122 Type 2 diabetes mellitus with diabetic chronic kidney disease: Secondary | ICD-10-CM | POA: Diagnosis not present

## 2020-06-30 DIAGNOSIS — R1012 Left upper quadrant pain: Secondary | ICD-10-CM | POA: Diagnosis not present

## 2020-06-30 DIAGNOSIS — T7491XA Unspecified adult maltreatment, confirmed, initial encounter: Secondary | ICD-10-CM

## 2020-06-30 DIAGNOSIS — Z955 Presence of coronary angioplasty implant and graft: Secondary | ICD-10-CM | POA: Diagnosis not present

## 2020-06-30 DIAGNOSIS — I25110 Atherosclerotic heart disease of native coronary artery with unstable angina pectoris: Secondary | ICD-10-CM | POA: Diagnosis not present

## 2020-06-30 DIAGNOSIS — E1165 Type 2 diabetes mellitus with hyperglycemia: Secondary | ICD-10-CM | POA: Diagnosis not present

## 2020-06-30 MED ORDER — NABUMETONE 500 MG PO TABS: 1000.0000 mg | ORAL_TABLET | Freq: Every day | ORAL | Status: DC | PRN

## 2020-06-30 MED ORDER — ISOSORBIDE MONONITRATE ER 60 MG PO TB24
60.0000 mg | ORAL_TABLET | Freq: Every day | ORAL | Status: DC
  Administered 2020-07-01 – 2020-07-03 (×3): 60 mg via ORAL
  Filled 2020-06-30 (×3): qty 1

## 2020-06-30 MED ORDER — ALBUTEROL SULFATE HFA 108 (90 BASE) MCG/ACT IN AERS
2.0000 | INHALATION_SPRAY | Freq: Four times a day (QID) | RESPIRATORY_TRACT | Status: DC | PRN
  Filled 2020-06-30: qty 6.7

## 2020-06-30 MED ORDER — BUPROPION HCL ER (XL) 300 MG PO TB24
300.0000 mg | ORAL_TABLET | Freq: Every day | ORAL | Status: DC
  Administered 2020-07-01 – 2020-07-03 (×3): 300 mg via ORAL
  Filled 2020-06-30 (×3): qty 1

## 2020-06-30 MED ORDER — CALCIUM CARBONATE-VITAMIN D 500-200 MG-UNIT PO TABS
1.0000 | ORAL_TABLET | Freq: Every day | ORAL | Status: DC
  Administered 2020-07-01 – 2020-07-03 (×3): 1 via ORAL
  Filled 2020-06-30 (×3): qty 1

## 2020-06-30 MED ORDER — ONDANSETRON HCL 4 MG/2ML IJ SOLN: 4.0000 mg | Freq: Four times a day (QID) | INTRAMUSCULAR | Status: DC | PRN

## 2020-06-30 MED ORDER — CLONAZEPAM 0.5 MG PO TABS: 1.0000 mg | ORAL_TABLET | Freq: Every day | ORAL | Status: DC | PRN

## 2020-06-30 MED ORDER — CILOSTAZOL 50 MG PO TABS
50.0000 mg | ORAL_TABLET | Freq: Every day | ORAL | Status: DC
  Administered 2020-06-30 – 2020-07-02 (×3): 50 mg via ORAL
  Filled 2020-06-30 (×3): qty 1

## 2020-06-30 MED ORDER — MELOXICAM 7.5 MG PO TABS
7.5000 mg | ORAL_TABLET | Freq: Every day | ORAL | Status: DC | PRN
  Filled 2020-06-30: qty 1

## 2020-06-30 MED ORDER — VITAMIN B-12 1000 MCG PO TABS
2000.0000 ug | ORAL_TABLET | Freq: Every day | ORAL | Status: DC
  Administered 2020-07-01 – 2020-07-03 (×3): 2000 ug via ORAL
  Filled 2020-06-30 (×3): qty 2

## 2020-06-30 MED ORDER — NITROGLYCERIN 0.4 MG SL SUBL: 0.4000 mg | SUBLINGUAL_TABLET | SUBLINGUAL | Status: DC | PRN

## 2020-06-30 MED ORDER — ATORVASTATIN CALCIUM 80 MG PO TABS
80.0000 mg | ORAL_TABLET | Freq: Every day | ORAL | Status: DC
  Administered 2020-07-01 – 2020-07-03 (×3): 80 mg via ORAL
  Filled 2020-06-30 (×3): qty 1

## 2020-06-30 MED ORDER — ALLOPURINOL 300 MG PO TABS
300.0000 mg | ORAL_TABLET | Freq: Every day | ORAL | Status: DC
  Administered 2020-07-01 – 2020-07-03 (×3): 300 mg via ORAL
  Filled 2020-06-30 (×3): qty 1

## 2020-06-30 MED ORDER — PANTOPRAZOLE SODIUM 40 MG PO TBEC
40.0000 mg | DELAYED_RELEASE_TABLET | Freq: Two times a day (BID) | ORAL | Status: DC
  Administered 2020-06-30 – 2020-07-03 (×6): 40 mg via ORAL
  Filled 2020-06-30 (×6): qty 1

## 2020-06-30 MED ORDER — FAMOTIDINE 20 MG PO TABS: 40.0000 mg | ORAL_TABLET | Freq: Every day | ORAL | Status: DC | PRN

## 2020-06-30 MED ORDER — METOPROLOL SUCCINATE ER 100 MG PO TB24
100.0000 mg | ORAL_TABLET | Freq: Every day | ORAL | Status: DC
  Administered 2020-07-01 – 2020-07-03 (×3): 100 mg via ORAL
  Filled 2020-06-30 (×3): qty 1

## 2020-06-30 MED ORDER — DULOXETINE HCL 60 MG PO CPEP
60.0000 mg | ORAL_CAPSULE | Freq: Every day | ORAL | Status: DC
  Administered 2020-07-01 – 2020-07-03 (×3): 60 mg via ORAL
  Filled 2020-06-30 (×3): qty 1

## 2020-06-30 MED ORDER — ACETAMINOPHEN 325 MG PO TABS
650.0000 mg | ORAL_TABLET | ORAL | Status: DC | PRN
  Administered 2020-07-01: 650 mg via ORAL
  Filled 2020-06-30: qty 2

## 2020-06-30 MED ORDER — ASPIRIN 81 MG PO CHEW
81.0000 mg | CHEWABLE_TABLET | Freq: Every day | ORAL | Status: DC
  Administered 2020-07-01 – 2020-07-03 (×2): 81 mg via ORAL
  Filled 2020-06-30 (×2): qty 1

## 2020-06-30 MED ORDER — MECLIZINE HCL 25 MG PO TABS
25.0000 mg | ORAL_TABLET | Freq: Every day | ORAL | Status: DC
  Administered 2020-07-01 – 2020-07-03 (×3): 25 mg via ORAL
  Filled 2020-06-30 (×3): qty 1

## 2020-06-30 MED ORDER — HEPARIN (PORCINE) 25000 UT/250ML-% IV SOLN
900.0000 [IU]/h | INTRAVENOUS | Status: DC
  Administered 2020-06-30: 750 [IU]/h via INTRAVENOUS
  Administered 2020-07-01: 850 [IU]/h via INTRAVENOUS
  Filled 2020-06-30: qty 250
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
## 2020-06-30 NOTE — Progress Notes (Signed)
ANTICOAGULATION CONSULT NOTE - Initial Consult  Pharmacy Consult for heparin Indication: chest pain/ACS  Allergies  Allergen Reactions  . Bactrim [Sulfamethoxazole-Trimethoprim] Nausea And Vomiting  . Penicillins     Unknown   . Sulfa Antibiotics     unknown    Patient Measurements: Weight: 66.1 kg (145 lb 11.6 oz)  Vital Signs:    Labs: No results for input(s): HGB, HCT, PLT, APTT, LABPROT, INR, HEPARINUNFRC, HEPRLOWMOCWT, CREATININE, CKTOTAL, CKMB, TROPONINIHS in the last 72 hours.  CrCl cannot be calculated (Patient's most recent lab result is older than the maximum 21 days allowed.).   Medical History: Past Medical History:  Diagnosis Date  . Angina pectoris (HCC)   . Anxiety   . ASCVD (arteriosclerotic cardiovascular disease)    single vessel  . ASD (atrial septal defect)    Small ASD with insignificant shunt  . Back pain, chronic   . Chronic ischemic heart disease   . Claudication (HCC)   . Coronary atherosclerosis of native coronary artery   . Depression   . Diabetes mellitus without complication (HCC)   . Edema   . GERD (gastroesophageal reflux disease)   . Hypercholesteremia   . Hypertension   . MRSA carrier    Chronic  . Occlusion and stenosis of carotid artery without mention of cerebral infarction    Last carotid doppler was in 2006. Patch angioplasty right femoral artery 01/1998, patenet 09/2005 ([facility])  . Osteoarthritis   . Osteopenia   . Other and unspecified angina pectoris   . PAD (peripheral artery disease) (HCC)   . Postsurgical aortocoronary bypass status   . Postsurgical percutaneous transluminal coronary angioplasty status   . Pseudodementia   . Right carotid bruit    Negative doppler 09/2002  . SOB (shortness of breath)    ECHO 04/13/12 EF estimated at 55-60%   Assessment: Crystal [name] presenting with CP, hx of CAD and CABG.  Heparin gtt started at outside facility and pharmacy consulted to continue on transfer.  Not on anticoagulation PTA.     Goal of Therapy:  Heparin level 0.3-0.7 units/ml Monitor platelets by anticoagulation protocol: Yes   Plan:  Heparin gtt at 750 units/hr F/u 6 hour heparin level Daily heparin level, CBC, s/s bleeding  Daylene Posey, PharmD Clinical Pharmacist Please check AMION for all [facility] Pharmacy numbers 06/30/2020 10:49 PM

## 2020-06-30 NOTE — Progress Notes (Signed)
Pt heparin stopped due to IV site bleeding. IV team consult put in due to pt stating that she has had multiple IVs blow today. Will restart drip when new IV is placed.

## 2020-07-01 ENCOUNTER — Inpatient Hospital Stay (HOSPITAL_COMMUNITY): Payer: Medicare Other

## 2020-07-01 DIAGNOSIS — I214 Non-ST elevation (NSTEMI) myocardial infarction: Principal | ICD-10-CM

## 2020-07-01 LAB — COMPREHENSIVE METABOLIC PANEL
ALT: 7 U/L (ref 0–44)
AST: 14 U/L — ABNORMAL LOW (ref 15–41)
Albumin: 3.1 g/dL — ABNORMAL LOW (ref 3.5–5.0)
Alkaline Phosphatase: 97 U/L (ref 38–126)
Anion gap: 10 (ref 5–15)
BUN: 21 mg/dL (ref 8–23)
CO2: 21 mmol/L — ABNORMAL LOW (ref 22–32)
Calcium: 9.6 mg/dL (ref 8.9–10.3)
Chloride: 106 mmol/L (ref 98–111)
Creatinine, Ser: 1.52 mg/dL — ABNORMAL HIGH (ref 0.44–1.00)
GFR calc Af Amer: 40 mL/min — ABNORMAL LOW (ref >60)
GFR calc non Af Amer: 35 mL/min — ABNORMAL LOW (ref >60)
Glucose, Bld: 186 mg/dL — ABNORMAL HIGH (ref 70–99)
Potassium: 4.9 mmol/L (ref 3.5–5.1)
Sodium: 137 mmol/L (ref 135–145)
Total Bilirubin: 0.5 mg/dL (ref 0.3–1.2)
Total Protein: 6 g/dL — ABNORMAL LOW (ref 6.5–8.1)

## 2020-07-01 LAB — ECHOCARDIOGRAM COMPLETE
AR max vel: 2.22 cm2
AV Area VTI: 2.18 cm2
AV Area mean vel: 2.15 cm2
AV Mean grad: 3 mmHg
AV Peak grad: 4.9 mmHg
Ao pk vel: 1.11 m/s
Area-P 1/2: 2.5 cm2
Height: 60 in
S' Lateral: 3.8 cm
Weight: 2331.58 oz

## 2020-07-01 LAB — LIPID PANEL
Cholesterol: 176 mg/dL (ref 0–200)
HDL: 30 mg/dL — ABNORMAL LOW (ref >40)
LDL Cholesterol: 80 mg/dL (ref 0–99)
Total CHOL/HDL Ratio: 5.9 RATIO
Triglycerides: 330 mg/dL — ABNORMAL HIGH (ref <150)
VLDL: 66 mg/dL — ABNORMAL HIGH (ref 0–40)

## 2020-07-01 LAB — MAGNESIUM: Magnesium: 1.8 mg/dL (ref 1.7–2.4)

## 2020-07-01 LAB — CBC
HCT: 34.4 % — ABNORMAL LOW (ref 36.0–46.0)
Hemoglobin: 12 g/dL (ref 12.0–15.0)
MCH: 34.6 pg — ABNORMAL HIGH (ref 26.0–34.0)
MCHC: 34.9 g/dL (ref 30.0–36.0)
MCV: 99.1 fL (ref 80.0–100.0)
Platelets: 299 10*3/uL (ref 150–400)
RBC: 3.47 MIL/uL — ABNORMAL LOW (ref 3.87–5.11)
RDW: 15.4 % (ref 11.5–15.5)
WBC: 12 10*3/uL — ABNORMAL HIGH (ref 4.0–10.5)
nRBC: 0 % (ref 0.0–0.2)

## 2020-07-01 LAB — MRSA PCR SCREENING: MRSA by PCR: NEGATIVE

## 2020-07-01 LAB — BRAIN NATRIURETIC PEPTIDE: B Natriuretic Peptide: 964.7 pg/mL — ABNORMAL HIGH (ref 0.0–100.0)

## 2020-07-01 LAB — HEMOGLOBIN A1C
Hgb A1c MFr Bld: 6.7 % — ABNORMAL HIGH (ref 4.8–5.6)
Mean Plasma Glucose: 145.59 mg/dL

## 2020-07-01 LAB — HEPARIN LEVEL (UNFRACTIONATED)
Heparin Unfractionated: 0.23 IU/mL — ABNORMAL LOW (ref 0.30–0.70)
Heparin Unfractionated: 0.47 IU/mL (ref 0.30–0.70)

## 2020-07-01 LAB — TSH: TSH: 1.205 u[IU]/mL (ref 0.350–4.500)

## 2020-07-01 LAB — HIV ANTIBODY (ROUTINE TESTING W REFLEX): HIV Screen 4th Generation wRfx: NONREACTIVE

## 2020-07-01 MED ORDER — SODIUM CHLORIDE 0.9% FLUSH
3.0000 mL | Freq: Two times a day (BID) | INTRAVENOUS | Status: DC
  Administered 2020-07-01 – 2020-07-03 (×2): 3 mL via INTRAVENOUS

## 2020-07-01 MED ORDER — SODIUM CHLORIDE 0.9 % WEIGHT BASED INFUSION: 1.0000 mL/kg/h | INTRAVENOUS | Status: DC

## 2020-07-01 MED ORDER — ASPIRIN 81 MG PO CHEW
81.0000 mg | CHEWABLE_TABLET | ORAL | Status: AC
  Administered 2020-07-02: 81 mg via ORAL
  Filled 2020-07-01: qty 1

## 2020-07-01 MED ORDER — PERFLUTREN LIPID MICROSPHERE
1.0000 mL | INTRAVENOUS | Status: AC | PRN
  Administered 2020-07-01: 5 mL via INTRAVENOUS
  Filled 2020-07-01: qty 10

## 2020-07-01 MED ORDER — SODIUM CHLORIDE 0.9 % IV SOLN: 250.0000 mL | INTRAVENOUS | Status: DC | PRN

## 2020-07-01 MED ORDER — SODIUM CHLORIDE 0.9 % WEIGHT BASED INFUSION
3.0000 mL/kg/h | INTRAVENOUS | Status: DC
  Administered 2020-07-02: 3 mL/kg/h via INTRAVENOUS

## 2020-07-01 MED ORDER — SODIUM CHLORIDE 0.9% FLUSH: 3.0000 mL | INTRAVENOUS | Status: DC | PRN

## 2020-07-01 NOTE — Progress Notes (Signed)
ANTICOAGULATION CONSULT NOTE  Pharmacy Consult for heparin Indication: chest pain/ACS  Assessment: 55 YOF presenting with CP, hx of CAD and CABG. Heparin gtt started at outside facility and pharmacy consulted to continue on transfer. Not on anticoagulation PTA.    Heparin level at goal on recheck this morning (0.4), no bleeding issues noted.   Goal of Therapy:  Heparin level 0.3-0.7 units/ml Monitor platelets by anticoagulation protocol: Yes   Plan:  Continue heparin gtt at 850 units/hr Daily heparin level, CBC, s/s bleeding  Thanks for allowing pharmacy to be a part of this patient's care.  Sheppard Coil PharmD., BCPS Clinical Pharmacist 07/01/2020 10:36 AM

## 2020-07-01 NOTE — Plan of Care (Signed)

## 2020-07-01 NOTE — Progress Notes (Signed)
Progress Note  Patient Name: Crystal [name] Date of Encounter: 07/01/2020  Primary Cardiologist: Donato Schultz, MD  Subjective   No chest pain or shortness of breath.  Inpatient Medications    Scheduled Meds: . allopurinol  300 mg Oral Daily  . aspirin  81 mg Oral Daily  . atorvastatin  80 mg Oral Daily  . buPROPion  300 mg Oral Daily  . calcium-vitamin D  1 tablet Oral Daily  . cilostazol  50 mg Oral QHS  . DULoxetine  60 mg Oral Daily  . isosorbide mononitrate  60 mg Oral Daily  . meclizine  25 mg Oral Daily  . metoprolol succinate  100 mg Oral Daily  . pantoprazole  40 mg Oral BID  . vitamin B-12  2,000 mcg Oral Daily   Continuous Infusions: . heparin 850 Units/hr (07/01/20 0700)   PRN Meds: acetaminophen, albuterol, clonazePAM, famotidine, meloxicam, nitroGLYCERIN, ondansetron (ZOFRAN) IV   Vital Signs    Vitals:   06/30/20 2306 06/30/20 2332 07/01/20 0301 07/01/20 0710  BP: 131/63  (!) 143/84 126/71  Pulse: 68  81 80  Resp: 20   17  Temp: 97.9 F (36.6 C)  97.7 F (36.5 C) 98.1 F (36.7 C)  TempSrc: Oral  Oral Oral  SpO2: 97%  98% 98%  Weight:      Height:  5' (1.524 m)      Intake/Output Summary (Last 24 hours) at 07/01/2020 0902 Last data filed at 07/01/2020 0700 Gross per 24 hour  Intake 64.59 ml  Output --  Net 64.59 ml   Filed Weights   06/30/20 2224  Weight: 66.1 kg    Telemetry    Sinus rhythm.  Personally reviewed.  ECG    An ECG dated 07/01/2020 was personally reviewed today and demonstrated:  Sinus rhythm with deep anterolateral T wave inversions.  Physical Exam   GEN: No acute distress.   Neck: No JVD. Cardiac: RRR, soft systolic murmur, no rub or gallop. Respiratory: Nonlabored. Clear to auscultation bilaterally. GI: Soft, nontender, bowel sounds present. MS: No edema; No deformity. Neuro:  Nonfocal. Psych: Alert and oriented x 3. Normal affect.  Labs    Chemistry Recent Labs  Lab 07/01/20 0043  NA 137  K 4.9  CL  106  CO2 21*  GLUCOSE 186*  BUN 21  CREATININE 1.52*  CALCIUM 9.6  PROT 6.0*  ALBUMIN 3.1*  AST 14*  ALT 7  ALKPHOS 97  BILITOT 0.5  GFRNONAA 35*  GFRAA 40*  ANIONGAP 10     Hematology Recent Labs  Lab 07/01/20 0043  WBC 12.0*  RBC 3.47*  HGB 12.0  HCT 34.4*  MCV 99.1  MCH 34.6*  MCHC 34.9  RDW 15.4  PLT 299    BNP Recent Labs  Lab 07/01/20 0043  BNP 964.7*     Radiology    No results found.  Patient Profile     69 y.o. female with a history of type 2 diabetes mellitus, COPD, hypertension, hyperlipidemia, asthma, and CAD status post CABG with RIMA to the RCA in 2002 found to be subsequently occluded and requiring PCI x2 thereafter.  She presents now with chest discomfort in the setting of emotional upset, evidence of NSTEMI.  Assessment & Plan    1.  NSTEMI, currently chest pain-free and hemodynamically stable.  High-sensitivity troponin I has trended down to 964.  ECG shows deep anterolateral T wave inversions, possibly Wellens T waves suggesting LAD disease or potentially a stress-induced cardiomyopathy.  2.  CAD status post CABG with RIMA to the RCA in 2002, known to be occluded and status post PCI to the native RCA.  3.  CKD stage IIIb per review of previous lab work, current creatinine 1.52.  4.  Essential hypertension.  5.  Mixed hyperlipidemia, LDL 80 on Lipitor.  Echocardiogram pending for today and patient will be scheduled for a diagnostic cardiac catheterization tomorrow.  Continue aspirin, Lipitor, Imdur, Toprol-XL, and heparin.  Hold off on initiating ARB at this time due to current creatinine and pending angiography.  Signed, Nona Dell, MD  07/01/2020, 9:02 AM

## 2020-07-01 NOTE — Progress Notes (Signed)
ANTICOAGULATION CONSULT NOTE  Pharmacy Consult for heparin Indication: chest pain/ACS  Assessment: 75 YOF presenting with CP, hx of CAD and CABG.  Heparin gtt started at outside facility and pharmacy consulted to continue on transfer.  Not on anticoagulation PTA.   Initial heparin level 0.23 units/ml  Goal of Therapy:  Heparin level 0.3-0.7 units/ml Monitor platelets by anticoagulation protocol: Yes   Plan:  Increase heparin gtt to 850 units/hr F/u 6 hour heparin level Daily heparin level, CBC, s/s bleeding  Thanks for allowing pharmacy to be a part of this patient's care.  Talbert Cage, PharmD Clinical Pharmacist 07/01/2020 2:06 AM

## 2020-07-01 NOTE — Progress Notes (Signed)
  Echocardiogram 2D Echocardiogram has been performed.  Gerda Diss 07/01/2020, 4:25 PM

## 2020-07-02 ENCOUNTER — Encounter (HOSPITAL_COMMUNITY)
Admission: AD | Disposition: A | Discharge status: Home / Self Care | Payer: Self-pay | Source: Other Acute Inpatient Hospital | Attending: Cardiology

## 2020-07-02 ENCOUNTER — Encounter (HOSPITAL_COMMUNITY): Payer: Self-pay | Admitting: Cardiology

## 2020-07-02 DIAGNOSIS — I10 Essential (primary) hypertension: Secondary | ICD-10-CM

## 2020-07-02 DIAGNOSIS — N182 Chronic kidney disease, stage 2 (mild): Secondary | ICD-10-CM

## 2020-07-02 DIAGNOSIS — N170 Acute kidney failure with tubular necrosis: Secondary | ICD-10-CM

## 2020-07-02 DIAGNOSIS — I25110 Atherosclerotic heart disease of native coronary artery with unstable angina pectoris: Secondary | ICD-10-CM

## 2020-07-02 DIAGNOSIS — I2510 Atherosclerotic heart disease of native coronary artery without angina pectoris: Secondary | ICD-10-CM

## 2020-07-02 DIAGNOSIS — E1122 Type 2 diabetes mellitus with diabetic chronic kidney disease: Secondary | ICD-10-CM

## 2020-07-02 HISTORY — PX: LEFT HEART CATH AND CORONARY ANGIOGRAPHY: CATH118249

## 2020-07-02 LAB — BASIC METABOLIC PANEL
Anion gap: 5 (ref 5–15)
Anion gap: 11 (ref 5–15)
BUN: 25 mg/dL — ABNORMAL HIGH (ref 8–23)
BUN: 26 mg/dL — ABNORMAL HIGH (ref 8–23)
CO2: 25 mmol/L (ref 22–32)
CO2: 17 mmol/L — ABNORMAL LOW (ref 22–32)
Calcium: 9.1 mg/dL (ref 8.9–10.3)
Calcium: 9.2 mg/dL (ref 8.9–10.3)
Chloride: 108 mmol/L (ref 98–111)
Chloride: 110 mmol/L (ref 98–111)
Creatinine, Ser: 1.91 mg/dL — ABNORMAL HIGH (ref 0.44–1.00)
Creatinine, Ser: 1.72 mg/dL — ABNORMAL HIGH (ref 0.44–1.00)
GFR calc Af Amer: 30 mL/min — ABNORMAL LOW (ref >60)
GFR calc Af Amer: 35 mL/min — ABNORMAL LOW (ref >60)
GFR calc non Af Amer: 26 mL/min — ABNORMAL LOW (ref >60)
GFR calc non Af Amer: 30 mL/min — ABNORMAL LOW (ref >60)
Glucose, Bld: 192 mg/dL — ABNORMAL HIGH (ref 70–99)
Glucose, Bld: 142 mg/dL — ABNORMAL HIGH (ref 70–99)
Potassium: 4.9 mmol/L (ref 3.5–5.1)
Potassium: 5 mmol/L (ref 3.5–5.1)
Sodium: 138 mmol/L (ref 135–145)
Sodium: 138 mmol/L (ref 135–145)

## 2020-07-02 LAB — CBC
HCT: 33.5 % — ABNORMAL LOW (ref 36.0–46.0)
Hemoglobin: 11.6 g/dL — ABNORMAL LOW (ref 12.0–15.0)
MCH: 35.2 pg — ABNORMAL HIGH (ref 26.0–34.0)
MCHC: 34.6 g/dL (ref 30.0–36.0)
MCV: 101.5 fL — ABNORMAL HIGH (ref 80.0–100.0)
Platelets: 290 10*3/uL (ref 150–400)
RBC: 3.3 MIL/uL — ABNORMAL LOW (ref 3.87–5.11)
RDW: 15.6 % — ABNORMAL HIGH (ref 11.5–15.5)
WBC: 8.5 10*3/uL (ref 4.0–10.5)
nRBC: 0 % (ref 0.0–0.2)

## 2020-07-02 LAB — HEPARIN LEVEL (UNFRACTIONATED)
Heparin Unfractionated: 0.28 IU/mL — ABNORMAL LOW (ref 0.30–0.70)
Heparin Unfractionated: 0.42 IU/mL (ref 0.30–0.70)

## 2020-07-02 SURGERY — LEFT HEART CATH AND CORONARY ANGIOGRAPHY
Anesthesia: LOCAL

## 2020-07-02 MED ORDER — MIDAZOLAM HCL 2 MG/2ML IJ SOLN
INTRAMUSCULAR | Status: DC | PRN
  Administered 2020-07-02: 1 mg via INTRAVENOUS

## 2020-07-02 MED ORDER — HEPARIN (PORCINE) IN NACL 1000-0.9 UT/500ML-% IV SOLN
INTRAVENOUS | Status: DC | PRN
  Administered 2020-07-02: 500 mL

## 2020-07-02 MED ORDER — SODIUM CHLORIDE 0.9 % IV SOLN: 250.0000 mL | INTRAVENOUS | Status: DC | PRN

## 2020-07-02 MED ORDER — SODIUM CHLORIDE 0.9% FLUSH
3.0000 mL | Freq: Two times a day (BID) | INTRAVENOUS | Status: DC
  Administered 2020-07-02 – 2020-07-03 (×2): 3 mL via INTRAVENOUS

## 2020-07-02 MED ORDER — ENOXAPARIN SODIUM 40 MG/0.4ML ~~LOC~~ SOLN: 40.0000 mg | SUBCUTANEOUS | Status: DC

## 2020-07-02 MED ORDER — SODIUM CHLORIDE 0.9 % WEIGHT BASED INFUSION
1.0000 mL/kg/h | INTRAVENOUS | Status: AC
  Administered 2020-07-02: 1 mL/kg/h via INTRAVENOUS

## 2020-07-02 MED ORDER — VERAPAMIL HCL 2.5 MG/ML IV SOLN
INTRAVENOUS | Status: AC
  Filled 2020-07-02: qty 2

## 2020-07-02 MED ORDER — IOHEXOL 350 MG/ML SOLN
INTRAVENOUS | Status: DC | PRN
  Administered 2020-07-02: 45 mL

## 2020-07-02 MED ORDER — LIDOCAINE HCL (PF) 1 % IJ SOLN
INTRAMUSCULAR | Status: AC
  Filled 2020-07-02: qty 30

## 2020-07-02 MED ORDER — HEPARIN SODIUM (PORCINE) 1000 UNIT/ML IJ SOLN
INTRAMUSCULAR | Status: AC
  Filled 2020-07-02: qty 1

## 2020-07-02 MED ORDER — SODIUM CHLORIDE 0.9% FLUSH: 3.0000 mL | INTRAVENOUS | Status: DC | PRN

## 2020-07-02 MED ORDER — FENTANYL CITRATE (PF) 100 MCG/2ML IJ SOLN
INTRAMUSCULAR | Status: AC
  Filled 2020-07-02: qty 2

## 2020-07-02 MED ORDER — VERAPAMIL HCL 2.5 MG/ML IV SOLN
INTRAVENOUS | Status: DC | PRN
  Administered 2020-07-02: 10 mL via INTRA_ARTERIAL

## 2020-07-02 MED ORDER — HEPARIN (PORCINE) IN NACL 1000-0.9 UT/500ML-% IV SOLN
INTRAVENOUS | Status: AC
  Filled 2020-07-02: qty 1000

## 2020-07-02 MED ORDER — FENTANYL CITRATE (PF) 100 MCG/2ML IJ SOLN
INTRAMUSCULAR | Status: DC | PRN
Start: 2020-07-02 — End: 2020-07-02
  Administered 2020-07-02: 25 ug via INTRAVENOUS

## 2020-07-02 MED ORDER — HEPARIN SODIUM (PORCINE) 1000 UNIT/ML IJ SOLN
INTRAMUSCULAR | Status: DC | PRN
  Administered 2020-07-02: 3500 [IU] via INTRAVENOUS

## 2020-07-02 MED ORDER — LIDOCAINE HCL (PF) 1 % IJ SOLN
INTRAMUSCULAR | Status: DC | PRN
  Administered 2020-07-02: 2 mL

## 2020-07-02 MED ORDER — MIDAZOLAM HCL 2 MG/2ML IJ SOLN
INTRAMUSCULAR | Status: AC
  Filled 2020-07-02: qty 2

## 2020-07-02 MED ORDER — ENOXAPARIN SODIUM 30 MG/0.3ML ~~LOC~~ SOLN
30.0000 mg | SUBCUTANEOUS | Status: DC
  Administered 2020-07-03: 30 mg via SUBCUTANEOUS
  Filled 2020-07-02: qty 0.3

## 2020-07-02 MED FILL — Perflutren Lipid Microsphere IV Susp 1.1 MG/ML: INTRAVENOUS | Qty: 10 | Status: AC

## 2020-07-02 SURGICAL SUPPLY — 14 items
CATH 5FR JL3.5 JR4 ANG PIG MP (CATHETERS) ×1 IMPLANT
CATH LAUNCHER 5F RADR (CATHETERS) IMPLANT
CATH LAUNCHER 6FR AL.75 (CATHETERS) ×1 IMPLANT
CATHETER LAUNCHER 5F RADR (CATHETERS) ×2
DEVICE RAD COMP TR BAND LRG (VASCULAR PRODUCTS) ×1 IMPLANT
GLIDESHEATH SLEND SS 6F .021 (SHEATH) ×1 IMPLANT
GUIDEWIRE INQWIRE 1.5J.035X260 (WIRE) IMPLANT
INQWIRE 1.5J .035X260CM (WIRE) ×2
KIT HEART LEFT (KITS) ×2 IMPLANT
PACK CARDIAC CATHETERIZATION (CUSTOM PROCEDURE TRAY) ×2 IMPLANT
SHEATH PROBE COVER 6X72 (BAG) ×1 IMPLANT
TRANSDUCER W/STOPCOCK (MISCELLANEOUS) ×2 IMPLANT
TUBING CIL FLEX 10 FLL-RA (TUBING) ×2 IMPLANT
WIRE HI TORQ VERSACORE-J 145CM (WIRE) ×1 IMPLANT

## 2020-07-02 NOTE — Interval H&P Note (Signed)
History and Physical Interval Note:  07/02/2020 12:01 PM  Crystal [name]  has presented today for surgery, with the diagnosis of NSTEMI.  The various methods of treatment have been discussed with the patient and family. After consideration of risks, benefits and other options for treatment, the patient has consented to  Procedure(s): LEFT HEART CATH AND CORS/GRAFTS ANGIOGRAPHY (N/A) as a surgical intervention.  The patient's history has been reviewed, patient examined, no change in status, stable for surgery.  I have reviewed the patient's chart and labs.  Questions were answered to the patient's satisfaction.   Cath Lab Visit (complete for each Cath Lab visit)  Clinical Evaluation Leading to the Procedure:   ACS: Yes.    Non-ACS:    Anginal Classification: CCS IV  Anti-ischemic medical therapy: Maximal Therapy (2 or more classes of medications)  Non-Invasive Test Results: No non-invasive testing performed  Prior CABG: Previous CABG        Theron Arista [facility] 07/02/2020 12:01 PM

## 2020-07-02 NOTE — Progress Notes (Addendum)
Progress Note  Patient Name: Crystal [name] Date of Encounter: 07/02/2020  [facility] Cardiologist: Donato Schultz, [name]   Subjective   No acute overnight event. Patient reports intermittent chest pain overnight but was not able to elaborate much on this. Currently chest pain free but did note some pleuritic discomfort this morning when taking deep breaths for me. No shortness of breath or palpitations. Plan is for cardiac catheterization today -> pending reevaluation of creatinine..  She notes that this is the best night sleep that she has had in a [place] time.  No longer having pain.  Inpatient Medications    Scheduled Meds: . allopurinol  300 mg Oral Daily  . aspirin  81 mg Oral Daily  . atorvastatin  80 mg Oral Daily  . buPROPion  300 mg Oral Daily  . calcium-vitamin D  1 tablet Oral Daily  . cilostazol  50 mg Oral QHS  . DULoxetine  60 mg Oral Daily  . isosorbide mononitrate  60 mg Oral Daily  . meclizine  25 mg Oral Daily  . metoprolol succinate  100 mg Oral Daily  . pantoprazole  40 mg Oral BID  . sodium chloride flush  3 mL Intravenous Q12H  . vitamin B-12  2,000 mcg Oral Daily   Continuous Infusions: . sodium chloride    . sodium chloride 1 mL/kg/hr (07/02/20 0559)  . heparin 900 Units/hr (07/02/20 0400)   PRN Meds: sodium chloride, acetaminophen, albuterol, clonazePAM, famotidine, meloxicam, nitroGLYCERIN, ondansetron (ZOFRAN) IV, sodium chloride flush   Vital Signs    Vitals:   07/01/20 1600 07/01/20 1945 07/01/20 2300 07/02/20 0422  BP: (!) 112/58 119/63 (!) 123/57 (!) 90/57  Pulse: 64  63 71  Resp: 19 15 (!) 21 17  Temp: 98.2 F (36.8 C) 98.2 F (36.8 C) 98.1 F (36.7 C) 97.8 F (36.6 C)  TempSrc: Oral Oral Oral Oral  SpO2: 98% 98% 98% 99%  Weight:    66.7 kg  Height:        Intake/Output Summary (Last 24 hours) at 07/02/2020 0707 Last data filed at 07/02/2020 0400 Gross per 24 hour  Intake 417.7 ml  Output --  Net 417.7 ml   Last 3 Weights  07/02/2020 06/30/2020 09/01/2019  Weight (lbs) 147 lb 0.8 oz 145 lb 11.6 oz 156 lb 12.8 oz  Weight (kg) 66.7 kg 66.1 kg 71.124 kg      Telemetry    Sinus rhythm with rates ranging from the 50's to 80's. PVCs also noted with some couplets and some bigeminy. - Personally Reviewed  ECG    No new ECG tracing today. - Personally Reviewed  Physical Exam   GEN: No acute distress.   Neck: Supple. Cardiac: RRR. No murmurs, rubs, or gallops. Radial and distal pedal pulses 2+ and equal bilaterally. Respiratory: Clear to auscultation bilaterally. No wheezes, rhonchi, or rales. GI: Soft, non-tender, non-distended  MS: No lower extremity edema. No deformity. Neuro:  No focal deficits. Psych: Normal affect. Responds appropriately.  Labs    High Sensitivity Troponin:  No results for input(s): TROPONINIHS in the last 720 hours.    Chemistry Recent Labs  Lab 07/01/20 0043 07/02/20 0042  NA 137 138  K 4.9 4.9  CL 106 108  CO2 21* 25  GLUCOSE 186* 192*  BUN 21 25*  CREATININE 1.52* 1.91*  CALCIUM 9.6 9.1  PROT 6.0*  --   ALBUMIN 3.1*  --   AST 14*  --   ALT 7  --  ALKPHOS 97  --   BILITOT 0.5  --   GFRNONAA 35* 26*  GFRAA 40* 30*  ANIONGAP 10 5     Hematology Recent Labs  Lab 07/01/20 0043 07/02/20 0042  WBC 12.0* 8.5  RBC 3.47* 3.30*  HGB 12.0 11.6*  HCT 34.4* 33.5*  MCV 99.1 101.5*  MCH 34.6* 35.2*  MCHC 34.9 34.6  RDW 15.4 15.6*  PLT 299 290    BNP Recent Labs  Lab 07/01/20 0043  BNP 964.7*     DDimer No results for input(s): DDIMER in the last 168 hours.   Radiology    ECHOCARDIOGRAM COMPLETE  Result Date: 07/01/2020    ECHOCARDIOGRAM REPORT   Patient Name:   Crystal [name] Date of Exam: 07/01/2020 Medical Rec #:  [id number]     Height:       60.0 in Accession #:    [id number]    Weight:       145.7 lb Date of Birth:  01/29/1951      BSA:          1.632 m Patient Age:    69 years      BP:           99/51 mmHg Patient Gender: F             HR:           80  bpm. Exam Location:  Inpatient Procedure: Cardiac Doppler, Color Doppler, 2D Echo and Intracardiac            Opacification Agent Indications:    Acute myocardial infarction  History:        Patient has prior history of Echocardiogram examinations, most                 recent 11/15/2017. Acute MI and CAD, Prior CABG, COPD; Risk                 Factors:Hypertension, Former Smoker and Dyslipidemia.  Sonographer:    Ross Ludwig RDCS (AE) Referring Phys: [id number] ANTHONY PHILIP CARNICELLI IMPRESSIONS  1. Left ventricular ejection fraction, by estimation, is 40 to 45%. The left ventricle has mild to moderately decreased function. The left ventricle demonstrates regional wall motion abnormalities (see scoring diagram/findings for description). There is  mild left ventricular hypertrophy. Left ventricular diastolic parameters are indeterminate. Wall motion abnormalities are suggestive of LAD distribution infarct, although stress-induced cardiomyopathy is not excluded.  2. Right ventricular systolic function is normal. The right ventricular size is normal. Tricuspid regurgitation signal is inadequate for assessing PA pressure.  3. The mitral valve is grossly normal. Trivial mitral valve regurgitation.  4. The aortic valve is tricuspid. Aortic valve regurgitation is trivial.  5. The inferior vena cava is normal in size with greater than 50% respiratory variability, suggesting right atrial pressure of 3 mmHg. FINDINGS  Left Ventricle: Left ventricular ejection fraction, by estimation, is 40 to 45%. The left ventricle has mild to moderately decreased function. The left ventricle demonstrates regional wall motion abnormalities. Definity contrast agent was given IV to delineate the left ventricular endocardial borders. The left ventricular internal cavity size was normal in size. There is mild left ventricular hypertrophy. Left ventricular diastolic parameters are indeterminate.  LV Wall Scoring: The mid and distal anterior septum,  apical lateral segment, apical inferior segment, and apex are akinetic. The mid and distal anterior wall and mid anterolateral segment are hypokinetic. The inferior wall, posterior wall, basal anteroseptal segment, basal anterolateral segment, mid inferoseptal segment,  basal anterior segment, and basal inferoseptal segment are normal. Right Ventricle: The right ventricular size is normal. No increase in right ventricular wall thickness. Right ventricular systolic function is normal. Tricuspid regurgitation signal is inadequate for assessing PA pressure. Left Atrium: Left atrial size was normal in size. Right Atrium: Right atrial size was normal in size. Pericardium: There is no evidence of pericardial effusion. Presence of pericardial fat pad. Mitral Valve: The mitral valve is grossly normal. Mild to moderate mitral annular calcification. Trivial mitral valve regurgitation. Tricuspid Valve: The tricuspid valve is grossly normal. Tricuspid valve regurgitation is trivial. Aortic Valve: The aortic valve is tricuspid. There is mild aortic valve annular calcification. Aortic valve regurgitation is trivial. Aortic valve mean gradient measures 3.0 mmHg. Aortic valve peak gradient measures 4.9 mmHg. Aortic valve area, by VTI measures 2.18 cm. Pulmonic Valve: The pulmonic valve was grossly normal. Pulmonic valve regurgitation is trivial. Aorta: The aortic root is normal in size and structure. Venous: The inferior vena cava is normal in size with greater than 50% respiratory variability, suggesting right atrial pressure of 3 mmHg. IAS/Shunts: No atrial level shunt detected by color flow Doppler.  LEFT VENTRICLE PLAX 2D LVIDd:         4.50 cm  Diastology LVIDs:         3.80 cm  LV e' medial:    3.12 cm/s LV PW:         1.00 cm  LV E/e' medial:  15.1 LV IVS:        1.10 cm  LV e' lateral:   2.94 cm/s LVOT diam:     2.00 cm  LV E/e' lateral: 16.0 LV SV:         49 LV SV Index:   30 LVOT Area:     3.14 cm  RIGHT VENTRICLE           IVC RV Basal diam:  3.10 cm  IVC diam: 1.10 cm TAPSE (M-mode): 1.4 cm LEFT ATRIUM             Index       RIGHT ATRIUM           Index LA diam:        3.50 cm 2.15 cm/m  RA Area:     12.20 cm LA Vol (A2C):   49.0 ml 30.03 ml/m RA Volume:   26.20 ml  16.06 ml/m LA Vol (A4C):   35.9 ml 22.00 ml/m LA Biplane Vol: 43.0 ml 26.35 ml/m  AORTIC VALVE AV Area (Vmax):    2.22 cm AV Area (Vmean):   2.15 cm AV Area (VTI):     2.18 cm AV Vmax:           111.00 cm/s AV Vmean:          76.000 cm/s AV VTI:            0.223 m AV Peak Grad:      4.9 mmHg AV Mean Grad:      3.0 mmHg LVOT Vmax:         78.30 cm/s LVOT Vmean:        51.900 cm/s LVOT VTI:          0.155 m LVOT/AV VTI ratio: 0.70  AORTA Ao Root diam: 2.90 cm Ao Asc diam:  2.70 cm MITRAL VALVE MV Area (PHT): 2.50 cm    SHUNTS MV Decel Time: 303 msec    Systemic VTI:  0.16 m MV E velocity: 47.10 cm/s  Systemic Diam: 2.00 cm MV A velocity: 88.30 cm/s MV E/A ratio:  0.53 Crystal [name] Electronically signed by Crystal [name] Signature Date/Time: 07/01/2020/4:54:32 PM    Final     Cardiac Studies   Echocardiogram 07/01/2020: Impressions: 1. Left ventricular ejection fraction, by estimation, is 40 to 45%. The  left ventricle has mild to moderately decreased function. The left  ventricle demonstrates regional wall motion abnormalities (see scoring  diagram/findings for description). There is  mild left ventricular hypertrophy. Left ventricular diastolic parameters  are indeterminate. Wall motion abnormalities are suggestive of LAD  distribution infarct, although stress-induced cardiomyopathy is not  excluded.  2. Right ventricular systolic function is normal. The right ventricular  size is normal. Tricuspid regurgitation signal is inadequate for assessing  PA pressure.  3. The mitral valve is grossly normal. Trivial mitral valve  regurgitation.  4. The aortic valve is tricuspid. Aortic valve regurgitation is trivial.  5. The inferior  vena cava is normal in size with greater than 50%  respiratory variability, suggesting right atrial pressure of 3 mmHg.   Patient Profile     69 y.o. female with a history of CAD s/p single vessel CABG with RIMA to RCA in 2002 with subsequent occlusion requiring PCI x2, small ASD, PAD s/p patch angioplasty to right femoral artery, hypertension, hyperlipidemia, diabetes mellitus, COPD, and depression/anxiety who was admitted on 06/30/2020 with NSTEMI after presenting with sudden onset of chest pain with radiation to back and left shoulder.  Assessment & Plan    NSTEMI History of CAD s/p CABGx1 for severe RCA ostial ISR (RCA graft now occluded) - Troponin I elevated at 1.070 >> 1.380 at outside hospital.  - EKG showed deep T wave inversions in anterolateral leads.  - Echo showed LVEF of 40-45% with wall motion abnormalities are suggestive of LAD distribution infarct although stress-induced cardiomyopathy cannot be excluded. - Currently chest pain free. - Continue IV Heparin.  - Continue aspirin, beta-blocker, and high-intensity statin. - Continue home Imdur 60mg  daily if BP allows. - Plan is for cardiac catheterization today. Patient consent for procedure yesterday. Of note, patient's creatinine is up to 1.9 today from 1.5 yesterday. Will recheck BMET around 9am after patient as received IV fluids - may need to delay cath if not improved.   Hypertension - BP soft at times but stable. - Continue home Imdur 60mg  daily and Toprol-XL 100mg  daily as BP allows.  Hyperlipidemia - Lipid panel this admission: Total Cholesterol 176, Triglycerides 330, HDL 30, LDL 80.  - LDL goal <70 given CAD and PAD. - Continue home Lipitor 80mg  daily.  - May need to consider adding Zetia or PCSK9 inhibitor as outpatient.  Diabetes Mellitus - Hemoglobin A1c 6.7% this admission which is in the pre-diabetes range. - Not on any medications at home.  PAD - S/p patch angioplasty to right femoral artery.  - On  Pletal at home. Will discuss with [name] to see if this needs to be discontinue given slightly reduced EF.  CKD Stage III - Creatinine 1.91 today, up from 1.52 yesterday. May be secondary to CT with contrast at outside hospital to rule out PE. - Patient currently receiving pre-hydration. Will repeat BMET today at 9am to see if OK to proceed with cath today.  Depression/Anxiety - Home Cymbalta, Wellbutrin, and Klonopin have been continued.    For questions or updates, please contact [facility] Please consult [email or web address] for contact info under        Signed, Corrin Parkerallie E Goodrich,  PA-C  07/02/2020, 7:07 AM

## 2020-07-02 NOTE — H&P (View-Only) (Signed)
Progress Note  Patient Name: Crystal [name] Date of Encounter: 07/02/2020  [facility] Cardiologist: Donato Schultz, MD   Subjective   No acute overnight event. Patient reports intermittent chest pain overnight but was not able to elaborate much on this. Currently chest pain free but did note some pleuritic discomfort this morning when taking deep breaths for me. No shortness of breath or palpitations. Plan is for cardiac catheterization today -> pending reevaluation of creatinine..  She notes that this is the best night sleep that she has had in a Fikes time.  No longer having pain.  Inpatient Medications    Scheduled Meds: . allopurinol  300 mg Oral Daily  . aspirin  81 mg Oral Daily  . atorvastatin  80 mg Oral Daily  . buPROPion  300 mg Oral Daily  . calcium-vitamin D  1 tablet Oral Daily  . cilostazol  50 mg Oral QHS  . DULoxetine  60 mg Oral Daily  . isosorbide mononitrate  60 mg Oral Daily  . meclizine  25 mg Oral Daily  . metoprolol succinate  100 mg Oral Daily  . pantoprazole  40 mg Oral BID  . sodium chloride flush  3 mL Intravenous Q12H  . vitamin B-12  2,000 mcg Oral Daily   Continuous Infusions: . sodium chloride    . sodium chloride 1 mL/kg/hr (07/02/20 0559)  . heparin 900 Units/hr (07/02/20 0400)   PRN Meds: sodium chloride, acetaminophen, albuterol, clonazePAM, famotidine, meloxicam, nitroGLYCERIN, ondansetron (ZOFRAN) IV, sodium chloride flush   Vital Signs    Vitals:   07/01/20 1600 07/01/20 1945 07/01/20 2300 07/02/20 0422  BP: (!) 112/58 119/63 (!) 123/57 (!) 90/57  Pulse: 64  63 71  Resp: 19 15 (!) 21 17  Temp: 98.2 F (36.8 C) 98.2 F (36.8 C) 98.1 F (36.7 C) 97.8 F (36.6 C)  TempSrc: Oral Oral Oral Oral  SpO2: 98% 98% 98% 99%  Weight:    66.7 kg  Height:        Intake/Output Summary (Last 24 hours) at 07/02/2020 0707 Last data filed at 07/02/2020 0400 Gross per 24 hour  Intake 417.7 ml  Output --  Net 417.7 ml   Last 3 Weights  07/02/2020 06/30/2020 09/01/2019  Weight (lbs) 147 lb 0.8 oz 145 lb 11.6 oz 156 lb 12.8 oz  Weight (kg) 66.7 kg 66.1 kg 71.124 kg      Telemetry    Sinus rhythm with rates ranging from the 50's to 80's. PVCs also noted with some couplets and some bigeminy. - Personally Reviewed  ECG    No new ECG tracing today. - Personally Reviewed  Physical Exam   GEN: No acute distress.   Neck: Supple. Cardiac: RRR. No murmurs, rubs, or gallops. Radial and distal pedal pulses 2+ and equal bilaterally. Respiratory: Clear to auscultation bilaterally. No wheezes, rhonchi, or rales. GI: Soft, non-tender, non-distended  MS: No lower extremity edema. No deformity. Neuro:  No focal deficits. Psych: Normal affect. Responds appropriately.  Labs    High Sensitivity Troponin:  No results for input(s): TROPONINIHS in the last 720 hours.    Chemistry Recent Labs  Lab 07/01/20 0043 07/02/20 0042  NA 137 138  K 4.9 4.9  CL 106 108  CO2 21* 25  GLUCOSE 186* 192*  BUN 21 25*  CREATININE 1.52* 1.91*  CALCIUM 9.6 9.1  PROT 6.0*  --   ALBUMIN 3.1*  --   AST 14*  --   ALT 7  --  ALKPHOS 97  --   BILITOT 0.5  --   GFRNONAA 35* 26*  GFRAA 40* 30*  ANIONGAP 10 5     Hematology Recent Labs  Lab 07/01/20 0043 07/02/20 0042  WBC 12.0* 8.5  RBC 3.47* 3.30*  HGB 12.0 11.6*  HCT 34.4* 33.5*  MCV 99.1 101.5*  MCH 34.6* 35.2*  MCHC 34.9 34.6  RDW 15.4 15.6*  PLT 299 290    BNP Recent Labs  Lab 07/01/20 0043  BNP 964.7*     DDimer No results for input(s): DDIMER in the last 168 hours.   Radiology    ECHOCARDIOGRAM COMPLETE  Result Date: 07/01/2020    ECHOCARDIOGRAM REPORT   Patient Name:   Crystal [name] Date of Exam: 07/01/2020 Medical Rec #:  [id number]     Height:       60.0 in Accession #:    [id number]    Weight:       145.7 lb Date of Birth:  01/17/1951      BSA:          1.632 m Patient Age:    69 years      BP:           99/51 mmHg Patient Gender: F             HR:           80  bpm. Exam Location:  Inpatient Procedure: Cardiac Doppler, Color Doppler, 2D Echo and Intracardiac            Opacification Agent Indications:    Acute myocardial infarction  History:        Patient has prior history of Echocardiogram examinations, most                 recent 11/15/2017. Acute MI and CAD, Prior CABG, COPD; Risk                 Factors:Hypertension, Former Smoker and Dyslipidemia.  Sonographer:    Arthur Guy RDCS (AE) Referring Phys: [id number] ANTHONY PHILIP CARNICELLI IMPRESSIONS  1. Left ventricular ejection fraction, by estimation, is 40 to 45%. The left ventricle has mild to moderately decreased function. The left ventricle demonstrates regional wall motion abnormalities (see scoring diagram/findings for description). There is  mild left ventricular hypertrophy. Left ventricular diastolic parameters are indeterminate. Wall motion abnormalities are suggestive of LAD distribution infarct, although stress-induced cardiomyopathy is not excluded.  2. Right ventricular systolic function is normal. The right ventricular size is normal. Tricuspid regurgitation signal is inadequate for assessing PA pressure.  3. The mitral valve is grossly normal. Trivial mitral valve regurgitation.  4. The aortic valve is tricuspid. Aortic valve regurgitation is trivial.  5. The inferior vena cava is normal in size with greater than 50% respiratory variability, suggesting right atrial pressure of 3 mmHg. FINDINGS  Left Ventricle: Left ventricular ejection fraction, by estimation, is 40 to 45%. The left ventricle has mild to moderately decreased function. The left ventricle demonstrates regional wall motion abnormalities. Definity contrast agent was given IV to delineate the left ventricular endocardial borders. The left ventricular internal cavity size was normal in size. There is mild left ventricular hypertrophy. Left ventricular diastolic parameters are indeterminate.  LV Wall Scoring: The mid and distal anterior septum,  apical lateral segment, apical inferior segment, and apex are akinetic. The mid and distal anterior wall and mid anterolateral segment are hypokinetic. The inferior wall, posterior wall, basal anteroseptal segment, basal anterolateral segment, mid inferoseptal segment,   basal anterior segment, and basal inferoseptal segment are normal. Right Ventricle: The right ventricular size is normal. No increase in right ventricular wall thickness. Right ventricular systolic function is normal. Tricuspid regurgitation signal is inadequate for assessing PA pressure. Left Atrium: Left atrial size was normal in size. Right Atrium: Right atrial size was normal in size. Pericardium: There is no evidence of pericardial effusion. Presence of pericardial fat pad. Mitral Valve: The mitral valve is grossly normal. Mild to moderate mitral annular calcification. Trivial mitral valve regurgitation. Tricuspid Valve: The tricuspid valve is grossly normal. Tricuspid valve regurgitation is trivial. Aortic Valve: The aortic valve is tricuspid. There is mild aortic valve annular calcification. Aortic valve regurgitation is trivial. Aortic valve mean gradient measures 3.0 mmHg. Aortic valve peak gradient measures 4.9 mmHg. Aortic valve area, by VTI measures 2.18 cm. Pulmonic Valve: The pulmonic valve was grossly normal. Pulmonic valve regurgitation is trivial. Aorta: The aortic root is normal in size and structure. Venous: The inferior vena cava is normal in size with greater than 50% respiratory variability, suggesting right atrial pressure of 3 mmHg. IAS/Shunts: No atrial level shunt detected by color flow Doppler.  LEFT VENTRICLE PLAX 2D LVIDd:         4.50 cm  Diastology LVIDs:         3.80 cm  LV e' medial:    3.12 cm/s LV PW:         1.00 cm  LV E/e' medial:  15.1 LV IVS:        1.10 cm  LV e' lateral:   2.94 cm/s LVOT diam:     2.00 cm  LV E/e' lateral: 16.0 LV SV:         49 LV SV Index:   30 LVOT Area:     3.14 cm  RIGHT VENTRICLE           IVC RV Basal diam:  3.10 cm  IVC diam: 1.10 cm TAPSE (M-mode): 1.4 cm LEFT ATRIUM             Index       RIGHT ATRIUM           Index LA diam:        3.50 cm 2.15 cm/m  RA Area:     12.20 cm LA Vol (A2C):   49.0 ml 30.03 ml/m RA Volume:   26.20 ml  16.06 ml/m LA Vol (A4C):   35.9 ml 22.00 ml/m LA Biplane Vol: 43.0 ml 26.35 ml/m  AORTIC VALVE AV Area (Vmax):    2.22 cm AV Area (Vmean):   2.15 cm AV Area (VTI):     2.18 cm AV Vmax:           111.00 cm/s AV Vmean:          76.000 cm/s AV VTI:            0.223 m AV Peak Grad:      4.9 mmHg AV Mean Grad:      3.0 mmHg LVOT Vmax:         78.30 cm/s LVOT Vmean:        51.900 cm/s LVOT VTI:          0.155 m LVOT/AV VTI ratio: 0.70  AORTA Ao Root diam: 2.90 cm Ao Asc diam:  2.70 cm MITRAL VALVE MV Area (PHT): 2.50 cm    SHUNTS MV Decel Time: 303 msec    Systemic VTI:  0.16 m MV E velocity: 47.10 cm/s  Systemic Diam: 2.00 cm MV A velocity: 88.30 cm/s MV E/A ratio:  0.53 Nona DellSamuel Mcdowell MD Electronically signed by Nona DellSamuel Mcdowell MD Signature Date/Time: 07/01/2020/4:54:32 PM    Final     Cardiac Studies   Echocardiogram 07/01/2020: Impressions: 1. Left ventricular ejection fraction, by estimation, is 40 to 45%. The  left ventricle has mild to moderately decreased function. The left  ventricle demonstrates regional wall motion abnormalities (see scoring  diagram/findings for description). There is  mild left ventricular hypertrophy. Left ventricular diastolic parameters  are indeterminate. Wall motion abnormalities are suggestive of LAD  distribution infarct, although stress-induced cardiomyopathy is not  excluded.  2. Right ventricular systolic function is normal. The right ventricular  size is normal. Tricuspid regurgitation signal is inadequate for assessing  PA pressure.  3. The mitral valve is grossly normal. Trivial mitral valve  regurgitation.  4. The aortic valve is tricuspid. Aortic valve regurgitation is trivial.  5. The inferior  vena cava is normal in size with greater than 50%  respiratory variability, suggesting right atrial pressure of 3 mmHg.   Patient Profile     69 y.o. female with a history of CAD s/p single vessel CABG with RIMA to RCA in 2002 with subsequent occlusion requiring PCI x2, small ASD, PAD s/p patch angioplasty to right femoral artery, hypertension, hyperlipidemia, diabetes mellitus, COPD, and depression/anxiety who was admitted on 06/30/2020 with NSTEMI after presenting with sudden onset of chest pain with radiation to back and left shoulder.  Assessment & Plan    NSTEMI History of CAD s/p CABGx1 for severe RCA ostial ISR (RCA graft now occluded) - Troponin I elevated at 1.070 >> 1.380 at outside hospital.  - EKG showed deep T wave inversions in anterolateral leads.  - Echo showed LVEF of 40-45% with wall motion abnormalities are suggestive of LAD distribution infarct although stress-induced cardiomyopathy cannot be excluded. - Currently chest pain free. - Continue IV Heparin.  - Continue aspirin, beta-blocker, and high-intensity statin. - Continue home Imdur 60mg  daily if BP allows. - Plan is for cardiac catheterization today. Patient consent for procedure yesterday. Of note, patient's creatinine is up to 1.9 today from 1.5 yesterday. Will recheck BMET around 9am after patient as received IV fluids - may need to delay cath if not improved.   Hypertension - BP soft at times but stable. - Continue home Imdur 60mg  daily and Toprol-XL 100mg  daily as BP allows.  Hyperlipidemia - Lipid panel this admission: Total Cholesterol 176, Triglycerides 330, HDL 30, LDL 80.  - LDL goal <70 given CAD and PAD. - Continue home Lipitor 80mg  daily.  - May need to consider adding Zetia or PCSK9 inhibitor as outpatient.  Diabetes Mellitus - Hemoglobin A1c 6.7% this admission which is in the pre-diabetes range. - Not on any medications at home.  PAD - S/p patch angioplasty to right femoral artery.  - On  Pletal at home. Will discuss with MD to see if this needs to be discontinue given slightly reduced EF.  CKD Stage III - Creatinine 1.91 today, up from 1.52 yesterday. May be secondary to CT with contrast at outside hospital to rule out PE. - Patient currently receiving pre-hydration. Will repeat BMET today at 9am to see if OK to proceed with cath today.  Depression/Anxiety - Home Cymbalta, Wellbutrin, and Klonopin have been continued.    For questions or updates, please contact [facility] Please consult [email or web address] for contact info under        Signed, Corrin Parkerallie E Goodrich,  PA-C  07/02/2020, 7:07 AM

## 2020-07-02 NOTE — Progress Notes (Signed)
ANTICOAGULATION CONSULT NOTE - Follow Up Consult  Pharmacy Consult for heparin Indication: NSTEMI  Labs: Recent Labs    07/01/20 0043 07/01/20 0951 07/02/20 0042 07/02/20 0045  HGB 12.0  --  11.6*  --   HCT 34.4*  --  33.5*  --   PLT 299  --  290  --   HEPARINUNFRC 0.23* 0.47  --  0.28*  CREATININE 1.52*  --  1.91*  --     Assessment: 69yo female slightly subtherapeutic on heparin after one level at goal; no gtt issues or signs of bleeding per RN.  Goal of Therapy:  Heparin level 0.3-0.7 units/ml   Plan:  Will increase heparin gtt slightly to 900 units/hr and check level in 8 hours.    Vernard Gambles, PharmD, BCPS  07/02/2020,3:31 AM

## 2020-07-03 ENCOUNTER — Other Ambulatory Visit: Payer: Self-pay

## 2020-07-03 ENCOUNTER — Encounter (HOSPITAL_COMMUNITY): Payer: Self-pay | Admitting: Internal Medicine

## 2020-07-03 DIAGNOSIS — I5181 Takotsubo syndrome: Secondary | ICD-10-CM

## 2020-07-03 DIAGNOSIS — I248 Other forms of acute ischemic heart disease: Secondary | ICD-10-CM

## 2020-07-03 DIAGNOSIS — E782 Mixed hyperlipidemia: Secondary | ICD-10-CM

## 2020-07-03 DIAGNOSIS — E785 Hyperlipidemia, unspecified: Secondary | ICD-10-CM

## 2020-07-03 DIAGNOSIS — N179 Acute kidney failure, unspecified: Secondary | ICD-10-CM

## 2020-07-03 DIAGNOSIS — Z720 Tobacco use: Secondary | ICD-10-CM

## 2020-07-03 DIAGNOSIS — I25119 Atherosclerotic heart disease of native coronary artery with unspecified angina pectoris: Secondary | ICD-10-CM

## 2020-07-03 DIAGNOSIS — E118 Type 2 diabetes mellitus with unspecified complications: Secondary | ICD-10-CM

## 2020-07-03 DIAGNOSIS — E875 Hyperkalemia: Secondary | ICD-10-CM

## 2020-07-03 DIAGNOSIS — N189 Chronic kidney disease, unspecified: Secondary | ICD-10-CM

## 2020-07-03 DIAGNOSIS — J431 Panlobular emphysema: Secondary | ICD-10-CM

## 2020-07-03 DIAGNOSIS — T7491XA Unspecified adult maltreatment, confirmed, initial encounter: Secondary | ICD-10-CM

## 2020-07-03 LAB — CBC WITH DIFFERENTIAL/PLATELET
Abs Immature Granulocytes: 0.08 10*3/uL — ABNORMAL HIGH (ref 0.00–0.07)
Basophils Absolute: 0.1 10*3/uL (ref 0.0–0.1)
Basophils Relative: 1 %
Eosinophils Absolute: 0.3 10*3/uL (ref 0.0–0.5)
Eosinophils Relative: 4 %
HCT: 32.5 % — ABNORMAL LOW (ref 36.0–46.0)
Hemoglobin: 11.1 g/dL — ABNORMAL LOW (ref 12.0–15.0)
Immature Granulocytes: 1 %
Lymphocytes Relative: 32 %
Lymphs Abs: 2.5 10*3/uL (ref 0.7–4.0)
MCH: 34.2 pg — ABNORMAL HIGH (ref 26.0–34.0)
MCHC: 34.2 g/dL (ref 30.0–36.0)
MCV: 100 fL (ref 80.0–100.0)
Monocytes Absolute: 0.8 10*3/uL (ref 0.1–1.0)
Monocytes Relative: 10 %
Neutro Abs: 4 10*3/uL (ref 1.7–7.7)
Neutrophils Relative %: 52 %
Platelets: 253 10*3/uL (ref 150–400)
RBC: 3.25 MIL/uL — ABNORMAL LOW (ref 3.87–5.11)
RDW: 15.2 % (ref 11.5–15.5)
WBC: 7.8 10*3/uL (ref 4.0–10.5)
nRBC: 0 % (ref 0.0–0.2)

## 2020-07-03 LAB — BASIC METABOLIC PANEL
Anion gap: 8 (ref 5–15)
BUN: 28 mg/dL — ABNORMAL HIGH (ref 8–23)
CO2: 21 mmol/L — ABNORMAL LOW (ref 22–32)
Calcium: 9.1 mg/dL (ref 8.9–10.3)
Chloride: 109 mmol/L (ref 98–111)
Creatinine, Ser: 1.62 mg/dL — ABNORMAL HIGH (ref 0.44–1.00)
GFR calc Af Amer: 37 mL/min — ABNORMAL LOW (ref >60)
GFR calc non Af Amer: 32 mL/min — ABNORMAL LOW (ref >60)
Glucose, Bld: 122 mg/dL — ABNORMAL HIGH (ref 70–99)
Potassium: 5.2 mmol/L — ABNORMAL HIGH (ref 3.5–5.1)
Sodium: 138 mmol/L (ref 135–145)

## 2020-07-03 LAB — MAGNESIUM: Magnesium: 1.5 mg/dL — ABNORMAL LOW (ref 1.7–2.4)

## 2020-07-03 LAB — POTASSIUM: Potassium: 5.1 mmol/L (ref 3.5–5.1)

## 2020-07-03 MED ORDER — FUROSEMIDE 20 MG PO TABS: 20.0000 mg | ORAL_TABLET | Freq: Every day | ORAL | 2 refills | Status: DC

## 2020-07-03 MED ORDER — FUROSEMIDE 20 MG PO TABS
20.0000 mg | ORAL_TABLET | Freq: Every day | ORAL | Status: DC
  Administered 2020-07-03: 20 mg via ORAL
  Filled 2020-07-03: qty 1

## 2020-07-03 MED ORDER — FUROSEMIDE 20 MG PO TABS: 20.0000 mg | ORAL_TABLET | Freq: Every day | ORAL | Status: DC

## 2020-07-03 MED ORDER — INFLUENZA VAC A&B SA ADJ QUAD 0.5 ML IM PRSY: 0.5000 mL | PREFILLED_SYRINGE | INTRAMUSCULAR | Status: DC

## 2020-07-03 MED ORDER — MAGNESIUM SULFATE 2 GM/50ML IV SOLN
2.0000 g | Freq: Once | INTRAVENOUS | Status: AC
  Administered 2020-07-03: 2 g via INTRAVENOUS
  Filled 2020-07-03: qty 50

## 2020-07-03 NOTE — Plan of Care (Signed)

## 2020-07-03 NOTE — Care Management Important Message (Signed)
Important Message  Patient Details  Name: Crystal [name] MRN: [id number] Date of Birth: 1951/07/25   Medicare Important Message Given:  Yes     Takeila Thayne 07/03/2020, 4:11 PM

## 2020-07-03 NOTE — Progress Notes (Signed)
Pt d/c home with son. Discharge orders given. PIV removed. All questions answered.

## 2020-07-03 NOTE — Progress Notes (Signed)
CARDIAC REHAB PHASE I   PRE:  Rate/Rhythm: 62 SR  BP:  Supine: 106/61  Sitting:   Standing:    SaO2: 97%RA  MODE:  Ambulation: 340 ft   POST:  Rate/Rhythm: 74 SR  BP:  Supine:   Sitting: 142/65  Standing:    SaO2: 99%RA 1015-1140 Pt walked 340 ft on RA with hand held asst and steady gait. No CP. MI education completed with pt and son who voiced understanding. Discussed takotsubo and importance of watching sodium and recovering slowly. Gave low sodium and heart healthy diets . Encouraged her to watch carbs also with A1C 6.7.   Gave walking instructions and discussed CRP 2. Will send referral letter to [facility]. Pt stated she smokes every once in a while. Encouraged smoking cessation and gave handout. Discussed MI restrictions.   Luetta Nutting, RN BSN  07/03/2020 11:34 AM

## 2020-07-03 NOTE — Discharge Instructions (Signed)
Medication Changes: - START Lasix 20mg  daily. - FINISH 7 day course of Doxycyline as previously prescribed. - STOP Pletal.  - STOP Meloxicam (Mobic) and Nabumetone (Relafen). If you need anything as needed for pain, we prefer Tylenol.  - STOP Omeprazole (Nexium) since you are also on Protonix. - STOP Prednisone as it sounds like you have completed the 5 day course of this.  Please take all other medications as prescribed elsewhere on discharge paperwork.  Post Cardiac Catheterization: NO HEAVY LIFTING OR SEXUAL ACTIVITY X 7 DAYS. NO DRIVING X 2-3 DAYS. NO SOAKING BATHS, HOT TUBS, POOLS, ETC., X 7 DAYS.  Radial Site Care: Refer to this sheet in the next few weeks. These instructions provide you with information on caring for yourself after your procedure. Your caregiver may also give you more specific instructions. Your treatment has been planned according to current medical practices, but problems sometimes occur. Call your caregiver if you have any problems or questions after your procedure. HOME CARE INSTRUCTIONS  You may shower the day after the procedure.Remove the bandage (dressing) and gently wash the site with plain soap and water.Gently pat the site dry.   Do not apply powder or lotion to the site.   Do not submerge the affected site in water for 3 to 5 days.   Inspect the site at least twice daily.   Do not flex or bend the affected arm for 24 hours.   No lifting over 5 pounds (2.3 kg) for 5 days after your procedure.   Do not drive home if you are discharged the same day of the procedure. Have someone else drive you.  What to expect:  Any bruising will usually fade within 1 to 2 weeks.   Blood that collects in the tissue (hematoma) may be painful to the touch. It should usually decrease in size and tenderness within 1 to 2 weeks.  SEEK IMMEDIATE MEDICAL CARE IF:  You have unusual pain at the radial site.   You have redness, warmth, swelling, or pain at the radial  site.   You have drainage (other than a small amount of blood on the dressing).   You have chills.   You have a fever or persistent symptoms for more than 72 hours.   You have a fever and your symptoms suddenly get worse.   Your arm becomes pale, cool, tingly, or numb.   You have heavy bleeding from the site. Hold pressure on the site.

## 2020-07-03 NOTE — TOC Benefit Eligibility Note (Signed)
Transition of Care [facility]) Benefit Eligibility Note    Patient Details  Name: Crystal [name] MRN: [id number] Date of Birth: 09-01-1951   Medication/Dose: Vascepa 2gm ,bid  Covered?: Yes  Tier:  (tier 4)  Prescription Coverage Preferred Pharmacy: [facility]  Spoke with Person/Company/Phone Number:: Per Marchia Bond L. [employer] [facility] PH#[phone]  Co-Pay: [id number]  Prior Approval: No  Deductible: Met       Shelda Altes Phone Number: 07/03/2020, 2:11 PM

## 2020-07-03 NOTE — Care Management (Signed)
Benefit check sent for vascepa 2gm bid
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
## 2020-07-11 NOTE — Progress Notes (Deleted)
Cardiology Office Note  Date:  07/11/2020   ID:  LAILEE HOELZEL, DOB 06/11/1951, MRN [id number]  PCP:  Lonie Peak, PA-C  Cardiologist:  Dr. Anne Fu  _____________  Hospital follow-up for NSTEMI  _____________   History of Present Illness: Crystal [name] is a 69 y.o. female with pmh of CAD s/p single vessel CABG with RIMA to RCA in 2002 with subsequent occlusion requiring PCIx2, small ASD, PAD s/p patch angioplasty to right femoral artery, HTN, HLD, DM2, COPD, and depression/anxiety who was admitted 06/30/20 for NSTEMI from [facility] ED. EKG showed deep anterolateral T wave inversions. D-dimer was elevated but chest CTA was negative for PE. Cr 1.5. Troponin up to 1.380.  Echo showed LVEF 40-45% with wall motion abnormalities. Suggestive of LAD distrubution infarct although stress induced cardiomyopathy could not be excluded. Cath showed non-obstructive CAD with continued patency of stent to RCA. LVEDP was normal. She was continue on her home meds and started on lasix 20 mg daily (might be able to switch to PRN as OP).   Today, she denies symptoms of palpitations, chest pain, shortness of breath, orthopnea, PND, lower extremity edema, claudication, dizziness, presyncope, syncope, bleeding, or neurologic sequela. The patient is tolerating medications without difficulties and is otherwise without complaint today.   _____________   Past Medical History:  Diagnosis Date  . Angina pectoris (HCC)   . Anxiety   . ASCVD (arteriosclerotic cardiovascular disease)    single vessel  . ASD (atrial septal defect)    Small ASD with insignificant shunt  . Back pain, chronic   . Chronic ischemic heart disease   . Claudication (HCC)   . Coronary atherosclerosis of native coronary artery   . Depression   . Diabetes mellitus without complication (HCC)   . Edema   . GERD (gastroesophageal reflux disease)   . Hypercholesteremia   . Hypertension   . MRSA carrier    Chronic  . Occlusion and stenosis of  carotid artery without mention of cerebral infarction    Last carotid doppler was in 2006. Patch angioplasty right femoral artery 01/1998, patenet 09/2005 ([facility])  . Osteoarthritis   . Osteopenia   . Other and unspecified angina pectoris   . PAD (peripheral artery disease) (HCC)   . Postsurgical aortocoronary bypass status   . Postsurgical percutaneous transluminal coronary angioplasty status   . Pseudodementia   . Right carotid bruit    Negative doppler 09/2002  . SOB (shortness of breath)    ECHO 04/13/12 EF estimated at 55-60%   Past Surgical History:  Procedure Laterality Date  . CORONARY ANGIOPLASTY WITH STENT PLACEMENT  01/15/98   PTCA stent RCA, PTCA diagonal  . CORONARY ANGIOPLASTY WITH STENT PLACEMENT  08/17/00   Cutting balloon ostial RCA  . CORONARY ANGIOPLASTY WITH STENT PLACEMENT  01/19/01   Unsuccessful attempted PTCA ostial RCA, stent protruding into root  . CORONARY ANGIOPLASTY WITH STENT PLACEMENT  01/20/01   RIMA-RCA, occluded cath 10/2003  . CORONARY ANGIOPLASTY WITH STENT PLACEMENT  02/02/04   Successful PTCA/drug eluding stent ostial RCA, [facility]  . LEFT HEART CATH AND CORONARY ANGIOGRAPHY N/A 07/02/2020   Procedure: LEFT HEART CATH AND CORONARY ANGIOGRAPHY;  Surgeon: [place], Peter M, MD;  Location: [facility] INVASIVE CV LAB;  Service: Cardiovascular;  Laterality: N/A;  . TUBAL LIGATION     _____________  Current Outpatient Medications  Medication Sig Dispense Refill  . albuterol (PROVENTIL HFA;VENTOLIN HFA) 108 (90 BASE) MCG/ACT inhaler Inhale 2 puffs into the lungs every 6 (six)  hours as needed for wheezing.     Marland Kitchen allopurinol (ZYLOPRIM) 300 MG tablet Take 300 mg by mouth daily.    Marland Kitchen aspirin 81 MG tablet Take 81 mg by mouth daily.    Marland Kitchen atorvastatin (LIPITOR) 80 MG tablet Take 1 tablet (80 mg total) by mouth daily. 90 tablet 3  . buPROPion (WELLBUTRIN XL) 300 MG 24 hr tablet Take 300 mg by mouth daily.    . calcium-vitamin D (OSCAL WITH D) 500-200 MG-UNIT per tablet  Take 1 tablet by mouth daily. 500 mg by mouth daily    . cholecalciferol (VITAMIN D) 400 UNITS TABS tablet Take 1,000 Units by mouth daily.    . clonazePAM (KLONOPIN) 1 MG tablet Take 1 mg by mouth daily as needed for anxiety.     . diphenoxylate-atropine (LOMOTIL) 2.5-0.025 MG tablet Take 1 tablet by mouth 4 (four) times daily as needed.    . doxycycline (VIBRA-TABS) 100 MG tablet Take 100 mg by mouth 2 (two) times daily.    . DULoxetine (CYMBALTA) 60 MG capsule Take 1 capsule (60 mg total) by mouth daily.    . EUCRISA 2 % OINT Apply 1 application topically daily as needed.     . famotidine (PEPCID) 40 MG tablet Take 40 mg by mouth daily as needed for heartburn or indigestion.     . furosemide (LASIX) 20 MG tablet Take 1 tablet (20 mg total) by mouth daily. 30 tablet 2  . isosorbide mononitrate (IMDUR) 60 MG 24 hr tablet Take 1 tablet (60 mg total) by mouth daily. 90 tablet 3  . meclizine (ANTIVERT) 25 MG tablet Take 25 mg by mouth daily.    . metoprolol succinate (TOPROL-XL) 50 MG 24 hr tablet Take two (2) tablet (100 mg) by mouth daily. 180 tablet 3  . nitroGLYCERIN (NITROSTAT) 0.4 MG SL tablet PLACE 1 TABLET UNDER TONGUE EVERY 5 MINS, UP TO 3 DOSES AS NEEDED FOR CHEST PAIN (Patient taking differently: Place 0.4 mg under the tongue every 5 (five) minutes as needed for chest pain. ) 25 tablet 5  . pantoprazole (PROTONIX) 40 MG tablet Take 1 tablet (40 mg total) by mouth 2 (two) times daily. 60 tablet 1  . QUEtiapine (SEROQUEL) 50 MG tablet Take 50 mg by mouth at bedtime.    Marland Kitchen tiZANidine (ZANAFLEX) 4 MG tablet Take 4 mg by mouth 3 (three) times daily as needed for muscle spasms.     . vitamin B-12 (CYANOCOBALAMIN) 1000 MCG tablet Take 2,000 mcg by mouth daily.     No current facility-administered medications for this visit.   _____________   Allergies:   Bactrim [sulfamethoxazole-trimethoprim], Penicillins, and Sulfa antibiotics  _____________   Social History:  The patient  reports that  she quit smoking about 9 years ago. Her smoking use included cigarettes. She has a 80.00 pack-year smoking history. She has never used smokeless tobacco. She reports that she does not drink alcohol and does not use drugs.  _____________   Family History:  The patient's family history includes Heart attack in her mother.  _____________   ROS:  Please see the history of present illness.   Positive for ***,   All other systems are reviewed and negative.  _____________   PHYSICAL EXAM: VS:  There were no vitals taken for this visit. , BMI There is no height or weight on file to calculate BMI. GEN: Well nourished, well developed, in no acute distress  HEENT: normal  Neck: no JVD, carotid bruits, or masses  Cardiac: RRR; no murmurs, rubs, or gallops. No clubbing, cyanosis, edema.  Radials/DP/PT 2+ and equal bilaterally.  Respiratory:  clear to auscultation bilaterally, normal work of breathing GI: soft, nontender, nondistended, + BS MS: no deformity or atrophy  Skin: warm and dry, no rash Neuro:  Strength and sensation are intact Psych: euthymic mood, full affect _____________  EKG:   The ekg ordered today shows ***  Recent Labs: 07/01/2020: ALT 7; B Natriuretic Peptide 964.7; TSH 1.205 07/03/2020: BUN 28; Creatinine, Ser 1.62; Hemoglobin 11.1; Magnesium 1.5; Platelets 253; Potassium 5.1; Sodium 138  07/01/2020: Cholesterol 176; HDL 30; LDL Cholesterol 80; Total CHOL/HDL Ratio 5.9; Triglycerides 330; VLDL 66  Estimated Creatinine Clearance: 27.9 mL/min (A) (by C-G formula based on SCr of 1.62 mg/dL (H)).  Wt Readings from Last 3 Encounters:  09/01/19 156 lb 12.8 oz (71.1 kg)  03/11/18 156 lb 12.8 oz (71.1 kg)  12/02/17 159 lb (72.1 kg)    Cardiac cath 07/02/20  Mid LAD-1 lesion is 30% stenosed.  Mid LAD-2 lesion is 30% stenosed.  Prox Cx lesion is 40% stenosed.  Ost RCA to Prox RCA lesion is 40% stenosed.  LV end diastolic pressure is normal.   1. Nonobstructive CAD. Continued  patency of the stent in the RCA 2. Normal LVEDP  Plan: clinical presentation is more c/w stress induced cardiomyopathy/Takotsubo's disease. Medical management.  Coronary Diagrams  Diagnostic Dominance: Right     Echo 07/01/20  1. Left ventricular ejection fraction, by estimation, is 40 to 45%. The  left ventricle has mild to moderately decreased function. The left  ventricle demonstrates regional wall motion abnormalities (see scoring  diagram/findings for description). There is  mild left ventricular hypertrophy. Left ventricular diastolic parameters  are indeterminate. Wall motion abnormalities are suggestive of LAD  distribution infarct, although stress-induced cardiomyopathy is not  excluded.  2. Right ventricular systolic function is normal. The right ventricular  size is normal. Tricuspid regurgitation signal is inadequate for assessing  PA pressure.  3. The mitral valve is grossly normal. Trivial mitral valve  regurgitation.  4. The aortic valve is tricuspid. Aortic valve regurgitation is trivial.  5. The inferior vena cava is normal in size with greater than 50%  respiratory variability, suggesting right atrial pressure of 3 mmHg.  _____________   ASSESSMENT AND PLAN:  Stress induced CM - admitted for NSTEMI with elevated troponin and EKG changes - Echo showed LVEF 40-45% with WMA, possible LAD infarct vs stress-induced CM - Cath showed nonobstructive CAD and patent stent - Continue Toprol XL 100 - Soft pressures limiting addition of other meds - repeat echo  CAD s/p single vessel CABG with RIMA to RCA - Cath as above - continue aspirin, statin, BB, Imdur   HTN - continue Imdur 60 mg daily, Toprol XL 100mg  daily  HLD - LDL 80, goal<70. Continue lipitor - can consider Vascepa  PAD - s/p patch angioplasty to right femroal artery. Home pletal discontinued due to low EF and soft pressures  Electrolyte abdnormalities - abnormal mag and K. Will  recheck BMET  Tobacco use - cessation discussed  Volume overlaod - lasix 20 mg on d/c, check BMET  Elevated D-dimer - repeat?  DM2 - plans to follow with PCP  CKD stage III - creatinie 1.62 on discharge - repeat BMET as above   Disposition:   FU with ***   Signed, Vincenza Dail , PA-C 07/11/2020 8:54 PM    _____________ [facility] [address] [place]  [id number]  [phone] (office) [phone] (fax)

## 2020-07-12 ENCOUNTER — Ambulatory Visit: Payer: Medicare Other | Admitting: Cardiology

## 2020-07-12 DIAGNOSIS — N183 Chronic kidney disease, stage 3 unspecified: Secondary | ICD-10-CM

## 2020-07-12 DIAGNOSIS — I5181 Takotsubo syndrome: Secondary | ICD-10-CM

## 2020-07-12 DIAGNOSIS — Z720 Tobacco use: Secondary | ICD-10-CM

## 2020-07-12 DIAGNOSIS — E782 Mixed hyperlipidemia: Secondary | ICD-10-CM

## 2020-07-12 DIAGNOSIS — I25119 Atherosclerotic heart disease of native coronary artery with unspecified angina pectoris: Secondary | ICD-10-CM

## 2020-07-12 DIAGNOSIS — I10 Essential (primary) hypertension: Secondary | ICD-10-CM

## 2020-07-16 ENCOUNTER — Telehealth (HOSPITAL_COMMUNITY): Payer: Self-pay

## 2020-07-16 NOTE — Telephone Encounter (Signed)
Faxed cardiac rehab referral to [facility] cardiac rehab.

## 2020-07-18 DIAGNOSIS — I248 Other forms of acute ischemic heart disease: Secondary | ICD-10-CM | POA: Diagnosis not present

## 2020-07-18 DIAGNOSIS — Z79899 Other long term (current) drug therapy: Secondary | ICD-10-CM | POA: Diagnosis not present

## 2020-07-18 DIAGNOSIS — I10 Essential (primary) hypertension: Secondary | ICD-10-CM | POA: Diagnosis not present

## 2020-07-18 DIAGNOSIS — I259 Chronic ischemic heart disease, unspecified: Secondary | ICD-10-CM | POA: Diagnosis not present

## 2020-07-18 DIAGNOSIS — N289 Disorder of kidney and ureter, unspecified: Secondary | ICD-10-CM | POA: Diagnosis not present

## 2020-07-18 DIAGNOSIS — E119 Type 2 diabetes mellitus without complications: Secondary | ICD-10-CM | POA: Diagnosis not present

## 2020-07-18 DIAGNOSIS — Z23 Encounter for immunization: Secondary | ICD-10-CM | POA: Diagnosis not present

## 2020-08-01 DIAGNOSIS — N289 Disorder of kidney and ureter, unspecified: Secondary | ICD-10-CM | POA: Diagnosis not present

## 2020-08-09 DIAGNOSIS — R259 Unspecified abnormal involuntary movements: Secondary | ICD-10-CM | POA: Diagnosis not present

## 2020-08-09 DIAGNOSIS — F17210 Nicotine dependence, cigarettes, uncomplicated: Secondary | ICD-10-CM | POA: Diagnosis not present

## 2020-08-09 DIAGNOSIS — Z8673 Personal history of transient ischemic attack (TIA), and cerebral infarction without residual deficits: Secondary | ICD-10-CM | POA: Diagnosis not present

## 2020-08-09 DIAGNOSIS — I2510 Atherosclerotic heart disease of native coronary artery without angina pectoris: Secondary | ICD-10-CM | POA: Diagnosis not present

## 2020-08-09 DIAGNOSIS — E785 Hyperlipidemia, unspecified: Secondary | ICD-10-CM | POA: Diagnosis not present

## 2020-08-09 DIAGNOSIS — F32A Depression, unspecified: Secondary | ICD-10-CM | POA: Diagnosis not present

## 2020-08-09 DIAGNOSIS — K219 Gastro-esophageal reflux disease without esophagitis: Secondary | ICD-10-CM | POA: Diagnosis not present

## 2020-08-09 DIAGNOSIS — I10 Essential (primary) hypertension: Secondary | ICD-10-CM | POA: Diagnosis not present

## 2020-08-09 DIAGNOSIS — M1990 Unspecified osteoarthritis, unspecified site: Secondary | ICD-10-CM | POA: Diagnosis not present

## 2020-08-09 DIAGNOSIS — N289 Disorder of kidney and ureter, unspecified: Secondary | ICD-10-CM | POA: Diagnosis not present

## 2020-08-29 DIAGNOSIS — E7800 Pure hypercholesterolemia, unspecified: Secondary | ICD-10-CM | POA: Diagnosis not present

## 2020-08-29 DIAGNOSIS — E860 Dehydration: Secondary | ICD-10-CM | POA: Diagnosis not present

## 2020-08-29 DIAGNOSIS — I700 Atherosclerosis of aorta: Secondary | ICD-10-CM | POA: Diagnosis not present

## 2020-08-29 DIAGNOSIS — R059 Cough, unspecified: Secondary | ICD-10-CM | POA: Diagnosis not present

## 2020-08-29 DIAGNOSIS — M1990 Unspecified osteoarthritis, unspecified site: Secondary | ICD-10-CM | POA: Diagnosis not present

## 2020-08-29 DIAGNOSIS — I10 Essential (primary) hypertension: Secondary | ICD-10-CM | POA: Diagnosis not present

## 2020-08-29 DIAGNOSIS — I252 Old myocardial infarction: Secondary | ICD-10-CM | POA: Diagnosis not present

## 2020-08-29 DIAGNOSIS — T18108A Unspecified foreign body in esophagus causing other injury, initial encounter: Secondary | ICD-10-CM | POA: Diagnosis not present

## 2020-08-29 DIAGNOSIS — Z20822 Contact with and (suspected) exposure to covid-19: Secondary | ICD-10-CM | POA: Diagnosis not present

## 2020-08-29 DIAGNOSIS — J029 Acute pharyngitis, unspecified: Secondary | ICD-10-CM | POA: Diagnosis not present

## 2020-08-29 DIAGNOSIS — Z951 Presence of aortocoronary bypass graft: Secondary | ICD-10-CM | POA: Diagnosis not present

## 2020-08-29 DIAGNOSIS — R1310 Dysphagia, unspecified: Secondary | ICD-10-CM | POA: Diagnosis not present

## 2020-08-29 DIAGNOSIS — F17210 Nicotine dependence, cigarettes, uncomplicated: Secondary | ICD-10-CM | POA: Diagnosis not present

## 2020-08-29 DIAGNOSIS — Z79899 Other long term (current) drug therapy: Secondary | ICD-10-CM | POA: Diagnosis not present

## 2020-08-29 DIAGNOSIS — Z8673 Personal history of transient ischemic attack (TIA), and cerebral infarction without residual deficits: Secondary | ICD-10-CM | POA: Diagnosis not present

## 2020-08-29 DIAGNOSIS — Z86718 Personal history of other venous thrombosis and embolism: Secondary | ICD-10-CM | POA: Diagnosis not present

## 2020-08-29 DIAGNOSIS — Z7982 Long term (current) use of aspirin: Secondary | ICD-10-CM | POA: Diagnosis not present

## 2020-08-29 DIAGNOSIS — K222 Esophageal obstruction: Secondary | ICD-10-CM | POA: Diagnosis not present

## 2020-08-29 DIAGNOSIS — I2510 Atherosclerotic heart disease of native coronary artery without angina pectoris: Secondary | ICD-10-CM | POA: Diagnosis not present

## 2020-08-29 DIAGNOSIS — R1110 Vomiting, unspecified: Secondary | ICD-10-CM | POA: Diagnosis not present

## 2020-08-29 DIAGNOSIS — R112 Nausea with vomiting, unspecified: Secondary | ICD-10-CM | POA: Diagnosis not present

## 2020-08-29 DIAGNOSIS — R197 Diarrhea, unspecified: Secondary | ICD-10-CM | POA: Diagnosis not present

## 2020-08-29 DIAGNOSIS — K219 Gastro-esophageal reflux disease without esophagitis: Secondary | ICD-10-CM | POA: Diagnosis not present

## 2020-08-30 DIAGNOSIS — K222 Esophageal obstruction: Secondary | ICD-10-CM | POA: Diagnosis not present

## 2020-08-30 DIAGNOSIS — F17210 Nicotine dependence, cigarettes, uncomplicated: Secondary | ICD-10-CM | POA: Diagnosis not present

## 2020-08-30 DIAGNOSIS — R1110 Vomiting, unspecified: Secondary | ICD-10-CM | POA: Diagnosis not present

## 2020-08-30 DIAGNOSIS — K2289 Other specified disease of esophagus: Secondary | ICD-10-CM | POA: Diagnosis not present

## 2020-08-30 DIAGNOSIS — J449 Chronic obstructive pulmonary disease, unspecified: Secondary | ICD-10-CM | POA: Diagnosis not present

## 2020-08-30 DIAGNOSIS — K229 Disease of esophagus, unspecified: Secondary | ICD-10-CM | POA: Diagnosis not present

## 2020-08-30 DIAGNOSIS — R059 Cough, unspecified: Secondary | ICD-10-CM | POA: Diagnosis not present

## 2020-09-06 ENCOUNTER — Other Ambulatory Visit: Payer: Self-pay | Admitting: Cardiology

## 2020-10-04 DIAGNOSIS — R413 Other amnesia: Secondary | ICD-10-CM | POA: Diagnosis not present

## 2020-10-04 DIAGNOSIS — Z741 Need for assistance with personal care: Secondary | ICD-10-CM | POA: Diagnosis not present

## 2020-10-04 DIAGNOSIS — I6529 Occlusion and stenosis of unspecified carotid artery: Secondary | ICD-10-CM | POA: Diagnosis not present

## 2020-10-04 DIAGNOSIS — J449 Chronic obstructive pulmonary disease, unspecified: Secondary | ICD-10-CM | POA: Diagnosis not present

## 2020-10-04 DIAGNOSIS — I259 Chronic ischemic heart disease, unspecified: Secondary | ICD-10-CM | POA: Diagnosis not present

## 2020-10-04 DIAGNOSIS — E119 Type 2 diabetes mellitus without complications: Secondary | ICD-10-CM | POA: Diagnosis not present
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
## 2020-12-12 ENCOUNTER — Telehealth: Payer: Self-pay | Admitting: Neurology

## 2020-12-12 ENCOUNTER — Encounter: Payer: Self-pay | Admitting: Neurology

## 2020-12-12 ENCOUNTER — Other Ambulatory Visit: Payer: Self-pay

## 2020-12-12 ENCOUNTER — Ambulatory Visit (INDEPENDENT_AMBULATORY_CARE_PROVIDER_SITE_OTHER): Payer: Medicare Other | Admitting: Neurology

## 2020-12-12 VITALS — BP 114/54 | HR 58 | Ht 60.0 in | Wt 143.0 lb

## 2020-12-12 DIAGNOSIS — R2990 Unspecified symptoms and signs involving the nervous system: Secondary | ICD-10-CM

## 2020-12-12 DIAGNOSIS — F02818 Dementia in other diseases classified elsewhere, unspecified severity, with other behavioral disturbance: Secondary | ICD-10-CM

## 2020-12-12 DIAGNOSIS — R413 Other amnesia: Secondary | ICD-10-CM | POA: Diagnosis not present

## 2020-12-12 DIAGNOSIS — F29 Unspecified psychosis not due to a substance or known physiological condition: Secondary | ICD-10-CM

## 2020-12-12 DIAGNOSIS — G3183 Dementia with Lewy bodies: Secondary | ICD-10-CM

## 2020-12-12 DIAGNOSIS — R6889 Other general symptoms and signs: Secondary | ICD-10-CM | POA: Diagnosis not present

## 2020-12-12 DIAGNOSIS — Z139 Encounter for screening, unspecified: Secondary | ICD-10-CM | POA: Diagnosis not present

## 2020-12-12 DIAGNOSIS — N399 Disorder of urinary system, unspecified: Secondary | ICD-10-CM | POA: Diagnosis not present

## 2020-12-12 DIAGNOSIS — R270 Ataxia, unspecified: Secondary | ICD-10-CM

## 2020-12-12 DIAGNOSIS — R4182 Altered mental status, unspecified: Secondary | ICD-10-CM

## 2020-12-12 DIAGNOSIS — W19XXXA Unspecified fall, initial encounter: Secondary | ICD-10-CM

## 2020-12-12 DIAGNOSIS — F0281 Dementia in other diseases classified elsewhere with behavioral disturbance: Secondary | ICD-10-CM

## 2020-12-12 NOTE — Patient Instructions (Addendum)
lewy body dementia vs psychiatric disorder(schizophrenia? depression with psychosis) or medication effect  MRI of the brain to look for vascular dementia/strokes.  A DAT Scan if we want to look for lewy Body Dementia .  Formal memory testing   Lewy Body Dementia Lewy body dementia, also called dementia with Lewy bodies, is a condition that affects the way the brain functions. It is one form of dementia. In this condition, proteins called Lewy bodies build up in certain areas of the brain. This causes problems with:  Memory.  Decision making.  Behavior.  Speaking.  Thinking and problem solving.  Movements and balance. This condition is progressive, which means that it gets worse with time (is degenerative) and cannot be reversed. What are the causes? This condition is caused by the buildup of Lewy bodies in brain cells in areas of the brain that control memory, thinking, and movement. It is not known what causes the Lewy bodies to build up. What increases the risk? You are more likely to develop this condition if you:  Have a family history of Lewy body dementia or Parkinson's disease.  Are 25 years old or older.  Are female. What are the signs or symptoms? Symptoms of this condition may include:  Seeing things that are not there (hallucinating).  Sleep problems, such as acting out dreams while you are asleep.  Changes in memory, attention, and concentration that come and go (fluctuate).  Symptoms of dementia, such as: ? Trouble with memory. ? Trouble paying attention. ? Problems with planning and organizing. ? Problems with judgment. ? Behavioral problems.  Symptoms of Parkinson's disease, such as: ? Shaking movements that you cannot control (tremor). Tremors usually start in a hand or foot when you are resting (resting tremor). ? Stooped posture. ? Slowing of movement. ? Stiff muscles (rigidity). ? Loss of balance and stability when standing. How is this  diagnosed? This condition is diagnosed by a specialist who diagnoses and treats this condition (neurologist). Your health care provider will talk with you and your family, friends, or caregivers about your history and symptoms. A thorough medical history will be taken, and you will have a physical exam and tests. Tests may include:  Lab tests, such as blood or urine tests.  Imaging tests, such as a CT scan, a PET scan, or an MRI.  A test that involves removing and testing a small amount of the fluid that surrounds the brain and spinal cord (lumbar puncture).  Tests that evaluate brain function, such as memory tests, cognitive tests, and neuropsychological tests. How is this treated? There is no cure for this condition. Treatment focuses on managing your symptoms. Treatment may include:  Medicines to help with hallucinations, sleep, and behavioral problems.  Speech, occupational, and physical therapy. Your health care provider can help direct you to support groups, organizations, and other health care providers who can help with decisions about your care. Follow these instructions at home: Medicines  Take over-the-counter and prescription medicines only as told by your health care provider.  To help you manage your medicines, use a pill organizer or pill reminder.  Avoid taking medicines that can affect thinking, such as pain medicines or certain sleeping medicines. Always check with your health care provider before taking any new medicines. Lifestyle  Make healthy lifestyle choices: ? Be physically active as told by your health care provider. ? Do not use any products that contain nicotine or tobacco, such as cigarettes, e-cigarettes, and chewing tobacco. If you need help  quitting, ask your health care provider. ? Try to practice stress-management techniques when you experience stress, such as mindfulness, yoga, or deep breathing. ? Stay socially connected. Talk regularly with other  people, such as family, friends, and neighbors.  Make sure you sleep well. These tips can help you get a good night's rest: ? Avoid napping during the day. ? Keep your sleeping area dark and cool. ? Avoid exercising a few hours before you go to bed. ? Avoid caffeine products in the evening. Eating and drinking  Do not drink alcohol.  Drink enough fluid to keep your urine pale yellow.  Eat a healthy diet. Safety  Work with your health care provider to determine what you need help with and what your safety needs are.  If you have trouble moving around, use a cane or walker as told by your health care provider.  Make sure your home environment is safe. To do this: ? Remove things that can be a tripping hazard, such as throw rugs or clutter. ? Install grab bars and railings in your home to prevent falls.  Talk with your health care provider about whether it is safe for you to drive.  If you were given a bracelet that identifies you as a person with memory loss or tracks your location, make sure to wear it at all times.   General instructions  Work with your family to make important decisions, such as advance directives, medical power of attorney, or a living will.  Keep all follow-up visits. This is important.   Where to find more information  [employer]: [employer].ninds.nih.gov Contact a health care provider if you have:  New or worsening confusion.  New or worsening trouble with sleeping or increased daytime sleepiness.  Problems with choking or swallowing. Get help right away if:  You feel depressed or sad, or feel that you want to harm yourself.  Your family members become concerned for your safety. If you ever feel like you may hurt yourself or others, or have thoughts about taking your own life, get help right away. Go to your nearest emergency department or:  Call your local emergency services (911 in the [place]  Call a  suicide crisis helpline, such as the National Suicide Prevention Lifeline at 41226857761-805-324-7586. This is open 24 hours a day in the [place]  Text the Crisis Text Line at 202-357-2439741741 (in the [place] Summary  Lewy body dementia is a condition that affects the way the brain functions. It causes problems with functions such as memory and thinking.  Lewy body dementia gets worse with time.  This condition is caused by the buildup of proteins called Lewy bodies in brain cells. It is not known what causes the Lewy bodies to build up.  Work with your health care provider to determine what you need help with and what your safety needs are.  Your health care provider can help direct you to support groups, organizations, and other health care providers who can help with decisions about your care. This information is not intended to replace advice given to you by your health care provider. Make sure you discuss any questions you have with your health care provider. Document Revised: 01/30/2020 Document Reviewed: 01/30/2020 Elsevier Patient Education  2021 [employer]  Brain DaTscan How to prepare and what to expect ?????????????????????????????????????What is a brain DaTscan? A brain DaTscan is a nuclear medicine scan. It uses radioactive material to diagnose some diseases of the brain, especially  those that cause tremor (shakiness). DaTscan is a brand name for a drug called ioflupane I-123. A brain DaTscan is a form of radiology, because radiation is used to take pictures of the body. This radioactive drug is ordered especially for you. Because of this, we need at least 72 hours' notice if you must cancel or reschedule your scan.   How does the scan work? You will be given a small dose of tracer (radioactive material) through an intravenous (IV) line. This tracer will collect in part of your brain and give off gamma rays. A special camera called a gamma camera will use these rays to produce pictures and  measurements of your brain. How do I prepare? Some drugs will affect the results of your brain DaTscan. You will need to stop taking these drugs before your scan. The table on page 2 lists the drugs that need to be stopped, and for how many days before your scan. This list is in alphabetical order by the generic name of the drug. The common brand names are listed beneath the generic name. Please confirm these instructions with your doctor who prescribed the drug. Drugs to Stop Taking Before your scan, stop taking these medicines for the length of time shown: Name of Drug Stop Taking  Amoxapine 4 days before  Benztropine  Cogentin 3 days before  Bupropion (Aplenzin, Budeprion, Voxra, Wellbutrin, Zyban) 48 hours before  Buspirone 15 hours before  Citalopram 24 hours before  Cocaine 6 hours before  Escitalopram 24 hours before  Methamphetamine 24 hours before  Methylphenidate (Concerta, Metadate, Methylin, Ritalin) 20 hours before  Paroxetine 24 hours before  Selegilene 48 hours before  Sertraline 3 days before  If you are breastfeeding, or if there is any chance you are pregnant, please tell the scheduler or technologist (the person who will help you prepare for your scan). How is the scan done? When you first arrive, we will ask you to drink a small cup of water with potassium iodine in it. This water may have a metallic taste.  An hour after you drink the potassium iodine water, the technologist will inject a small amount of tracer into a vein in your arm or hand through your IV.  You must stay in the department for 30 minutes after the injection.  You will then have a break for 3 hours. It is OK to eat and drink during this break.  You must return to the clinic after this 3-hour break to have images of your brain taken.  Then, 4 hours after you receive your tracer injection, the technologist will take images of your brain with the gamma camera. You will lie flat on the exam table while  these images are being taken.  You must not move while the camera is taking pictures. If you move, the pictures will be blurry and may have to be taken again.  Taking the images will take 40 to 45 minutes. Your total time in the imaging room will be about 1 hour.  You may also have a low-dose CT scan of your brain to help confirm any results. A CT scan is another way to take images inside your body.  It will take about 5 hours from the time you drink the potassium iodine water until the scans are complete. What will I feel during the scan? The technologist will help make you as comfortable as possible on the exam table for the scan.  You may feel some minor discomfort from  the IV.  Lying still on the exam table may be hard for some patients.  The camera will be close to your head. This may make you feel confined or uneasy (claustrophobic). Please tell the doctor who referred you for this scan if you know you are claustrophobic. Are there any side effects from the scan? Most of the radioactivity from the tracer will pass out of your body in your urine or stool. The rest simply goes away over time.  Bad reactions to this scan are very rare. Fewer than 1% of patients (fewer than 1 out of 100) have a bad reaction. Reactions may include headache, nausea, vertigo (dizziness), or dry mouth. How do I get the results? When the test is over, the nuclear medicine doctor will review your images, prepare a written report, and talk with your doctor about the results. Your doctor will then talk with you about the results and your treatment options. If you needed to stop taking any medicines on the day of your scan, ask your doctor when to start taking them again.  The potentially interfering drugs consist of: amoxapine, amphetamine, benztropine, bupropion, buspirone, citalopram, cocaine, mazindol, methamphetamine, methylphenidate, norephedrine, phentermine, escitalopram, phenylpropanolamine, selegiline,  paroxetine, and sertraline

## 2020-12-12 NOTE — Telephone Encounter (Signed)
[employer] medicare/medicaid order sent to [facility]. No auth they will reach out to the patient to schedule.  °

## 2020-12-15 LAB — URINE CULTURE

## 2020-12-17 ENCOUNTER — Other Ambulatory Visit: Payer: Self-pay | Admitting: Neurology

## 2020-12-17 MED ORDER — NITROFURANTOIN MONOHYD MACRO 100 MG PO CAPS: 100.0000 mg | ORAL_CAPSULE | Freq: Two times a day (BID) | ORAL | 0 refills | Status: DC

## 2020-12-21 LAB — TOXASSURE SELECT 13 (MW), URINE

## 2020-12-23 LAB — TSH: TSH: 1.22 u[IU]/mL (ref 0.450–4.500)

## 2020-12-23 LAB — CBC WITH DIFFERENTIAL/PLATELET
Basophils Absolute: 0.1 10*3/uL (ref 0.0–0.2)
Basos: 1 %
EOS (ABSOLUTE): 0.4 10*3/uL (ref 0.0–0.4)
Eos: 6 %
Hematocrit: 34.8 % (ref 34.0–46.6)
Hemoglobin: 11.7 g/dL (ref 11.1–15.9)
Immature Grans (Abs): 0 10*3/uL (ref 0.0–0.1)
Immature Granulocytes: 1 %
Lymphocytes Absolute: 1.6 10*3/uL (ref 0.7–3.1)
Lymphs: 25 %
MCH: 33.4 pg — ABNORMAL HIGH (ref 26.6–33.0)
MCHC: 33.6 g/dL (ref 31.5–35.7)
MCV: 99 fL — ABNORMAL HIGH (ref 79–97)
Monocytes Absolute: 0.7 10*3/uL (ref 0.1–0.9)
Monocytes: 12 %
Neutrophils Absolute: 3.6 10*3/uL (ref 1.4–7.0)
Neutrophils: 55 %
Platelets: 226 10*3/uL (ref 150–450)
RBC: 3.5 x10E6/uL — ABNORMAL LOW (ref 3.77–5.28)
RDW: 14.2 % (ref 11.7–15.4)
WBC: 6.4 10*3/uL (ref 3.4–10.8)

## 2020-12-23 LAB — COMPREHENSIVE METABOLIC PANEL
ALT: 6 IU/L (ref 0–32)
AST: 13 IU/L (ref 0–40)
Albumin/Globulin Ratio: 2.5 — ABNORMAL HIGH (ref 1.2–2.2)
Albumin: 4 g/dL (ref 3.8–4.8)
Alkaline Phosphatase: 111 IU/L (ref 44–121)
BUN/Creatinine Ratio: 14 (ref 12–28)
BUN: 24 mg/dL (ref 8–27)
Bilirubin Total: 0.4 mg/dL (ref 0.0–1.2)
CO2: 19 mmol/L — ABNORMAL LOW (ref 20–29)
Calcium: 9.1 mg/dL (ref 8.7–10.3)
Chloride: 103 mmol/L (ref 96–106)
Creatinine, Ser: 1.73 mg/dL — ABNORMAL HIGH (ref 0.57–1.00)
Globulin, Total: 1.6 g/dL (ref 1.5–4.5)
Glucose: 160 mg/dL — ABNORMAL HIGH (ref 65–99)
Potassium: 4.3 mmol/L (ref 3.5–5.2)
Sodium: 138 mmol/L (ref 134–144)
Total Protein: 5.6 g/dL — ABNORMAL LOW (ref 6.0–8.5)
eGFR: 31 mL/min/{1.73_m2} — ABNORMAL LOW (ref >59)

## 2020-12-23 LAB — MICROSCOPIC EXAMINATION
Casts: NONE SEEN /lpf
RBC, Urine: NONE SEEN /hpf (ref 0–2)

## 2020-12-23 LAB — URINALYSIS, ROUTINE W REFLEX MICROSCOPIC
Bilirubin, UA: NEGATIVE
Glucose, UA: NEGATIVE
Ketones, UA: NEGATIVE
Nitrite, UA: NEGATIVE
RBC, UA: NEGATIVE
Specific Gravity, UA: 1.022 (ref 1.005–1.030)
Urobilinogen, Ur: 0.2 mg/dL (ref 0.2–1.0)
pH, UA: 5.5 (ref 5.0–7.5)

## 2020-12-23 LAB — B12 AND FOLATE PANEL
Folate: 4 ng/mL (ref >3.0)
Vitamin B-12: 1094 pg/mL (ref 232–1245)

## 2020-12-23 LAB — METHYLMALONIC ACID, SERUM: Methylmalonic Acid: 210 nmol/L (ref 0–378)

## 2020-12-23 LAB — HOMOCYSTEINE: Homocysteine: 19.3 umol/L — ABNORMAL HIGH (ref 0.0–17.2)

## 2020-12-23 LAB — RPR: RPR Ser Ql: NONREACTIVE

## 2020-12-23 LAB — VITAMIN B1: Thiamine: 128.9 nmol/L (ref 66.5–200.0)

## 2020-12-23 LAB — AMMONIA: Ammonia: 26 ug/dL — ABNORMAL LOW (ref 34–178)

## 2020-12-25 ENCOUNTER — Ambulatory Visit
Admission: RE | Admit: 2020-12-25 | Discharge: 2020-12-25 | Disposition: A | Discharge status: Home / Self Care | Payer: Medicare Other | Source: Ambulatory Visit | Attending: Neurology | Admitting: Neurology

## 2020-12-25 ENCOUNTER — Other Ambulatory Visit: Payer: Self-pay

## 2020-12-25 DIAGNOSIS — W19XXXA Unspecified fall, initial encounter: Secondary | ICD-10-CM

## 2020-12-25 DIAGNOSIS — R270 Ataxia, unspecified: Secondary | ICD-10-CM

## 2020-12-25 DIAGNOSIS — R413 Other amnesia: Secondary | ICD-10-CM

## 2020-12-25 DIAGNOSIS — R2990 Unspecified symptoms and signs involving the nervous system: Secondary | ICD-10-CM | POA: Diagnosis not present

## 2020-12-25 DIAGNOSIS — F29 Unspecified psychosis not due to a substance or known physiological condition: Secondary | ICD-10-CM

## 2020-12-25 DIAGNOSIS — R4182 Altered mental status, unspecified: Secondary | ICD-10-CM

## 2020-12-25 MED ORDER — GADOBENATE DIMEGLUMINE 529 MG/ML IV SOLN
13.0000 mL | Freq: Once | INTRAVENOUS | Status: AC | PRN
  Administered 2020-12-25: 13 mL via INTRAVENOUS

## 2020-12-26 ENCOUNTER — Telehealth: Payer: Self-pay | Admitting: *Deleted

## 2020-12-26 ENCOUNTER — Other Ambulatory Visit: Payer: Self-pay | Admitting: Neurology

## 2020-12-26 DIAGNOSIS — M4802 Spinal stenosis, cervical region: Secondary | ICD-10-CM

## 2020-12-26 DIAGNOSIS — M5000 Cervical disc disorder with myelopathy, unspecified cervical region: Secondary | ICD-10-CM

## 2020-12-26 NOTE — Addendum Note (Signed)
Addended by: Naomie Dean B on: 12/26/2020 04:35 PM   Modules accepted: Orders

## 2020-12-26 NOTE — Telephone Encounter (Signed)
-----   Message from Anson Fret, MD sent at 12/25/2020 10:11 PM EDT ----- Severe spinal stenosis and cord compression, she needs to be evaluated by neurosurgery. I will place referral after you speak with patient(let me know so I can place the order if they agree), first available physician at [facility] recommended.

## 2020-12-26 NOTE — Telephone Encounter (Signed)
Spoke with pt's son Jarvis Newcomer and daughter Cyndia Skeeters (both on [place]) and discussed the results of the MRI cervical spine which indicate severe stenosis and cord compression. They are in agreement to refer patient to neurosurgery to discuss options. Pt's son also mentioned they were very interested in patient having a DAT scan to rule out lewy body dementia as patient suffers from hallucinations, aggressions and difficulty sleeping. The son verbalized appreciation for the call and their questions were answered. They understand [facility] will call to schedule the consult and a message will be sent to Dr Lucia Gaskins about the DAT scan.

## 2020-12-31 DIAGNOSIS — Z881 Allergy status to other antibiotic agents status: Secondary | ICD-10-CM | POA: Diagnosis not present

## 2020-12-31 DIAGNOSIS — Z880 Allergy status to penicillin: Secondary | ICD-10-CM | POA: Diagnosis not present

## 2020-12-31 DIAGNOSIS — Z794 Long term (current) use of insulin: Secondary | ICD-10-CM | POA: Diagnosis not present

## 2020-12-31 DIAGNOSIS — M4312 Spondylolisthesis, cervical region: Secondary | ICD-10-CM | POA: Diagnosis not present

## 2020-12-31 DIAGNOSIS — R079 Chest pain, unspecified: Secondary | ICD-10-CM | POA: Diagnosis not present

## 2020-12-31 DIAGNOSIS — Z9151 Personal history of suicidal behavior: Secondary | ICD-10-CM | POA: Diagnosis not present

## 2020-12-31 DIAGNOSIS — E785 Hyperlipidemia, unspecified: Secondary | ICD-10-CM | POA: Diagnosis not present

## 2020-12-31 DIAGNOSIS — M47812 Spondylosis without myelopathy or radiculopathy, cervical region: Secondary | ICD-10-CM | POA: Diagnosis not present

## 2020-12-31 DIAGNOSIS — E119 Type 2 diabetes mellitus without complications: Secondary | ICD-10-CM | POA: Diagnosis not present

## 2020-12-31 DIAGNOSIS — R413 Other amnesia: Secondary | ICD-10-CM | POA: Diagnosis not present

## 2020-12-31 DIAGNOSIS — R309 Painful micturition, unspecified: Secondary | ICD-10-CM | POA: Diagnosis not present

## 2020-12-31 DIAGNOSIS — I252 Old myocardial infarction: Secondary | ICD-10-CM | POA: Diagnosis not present

## 2020-12-31 DIAGNOSIS — I2510 Atherosclerotic heart disease of native coronary artery without angina pectoris: Secondary | ICD-10-CM | POA: Diagnosis not present

## 2020-12-31 DIAGNOSIS — J449 Chronic obstructive pulmonary disease, unspecified: Secondary | ICD-10-CM | POA: Diagnosis not present

## 2020-12-31 DIAGNOSIS — Z955 Presence of coronary angioplasty implant and graft: Secondary | ICD-10-CM | POA: Diagnosis not present

## 2020-12-31 DIAGNOSIS — Z20822 Contact with and (suspected) exposure to covid-19: Secondary | ICD-10-CM | POA: Diagnosis not present

## 2020-12-31 DIAGNOSIS — N183 Chronic kidney disease, stage 3 unspecified: Secondary | ICD-10-CM | POA: Diagnosis not present

## 2020-12-31 DIAGNOSIS — F32A Depression, unspecified: Secondary | ICD-10-CM | POA: Diagnosis not present

## 2020-12-31 DIAGNOSIS — I259 Chronic ischemic heart disease, unspecified: Secondary | ICD-10-CM | POA: Diagnosis not present

## 2020-12-31 DIAGNOSIS — N390 Urinary tract infection, site not specified: Secondary | ICD-10-CM | POA: Diagnosis not present

## 2020-12-31 DIAGNOSIS — I959 Hypotension, unspecified: Secondary | ICD-10-CM | POA: Diagnosis not present

## 2020-12-31 DIAGNOSIS — Z951 Presence of aortocoronary bypass graft: Secondary | ICD-10-CM | POA: Diagnosis not present

## 2020-12-31 DIAGNOSIS — R001 Bradycardia, unspecified: Secondary | ICD-10-CM | POA: Diagnosis not present

## 2020-12-31 DIAGNOSIS — E860 Dehydration: Secondary | ICD-10-CM | POA: Diagnosis not present

## 2020-12-31 DIAGNOSIS — I129 Hypertensive chronic kidney disease with stage 1 through stage 4 chronic kidney disease, or unspecified chronic kidney disease: Secondary | ICD-10-CM | POA: Diagnosis not present

## 2020-12-31 DIAGNOSIS — M4802 Spinal stenosis, cervical region: Secondary | ICD-10-CM | POA: Diagnosis not present

## 2020-12-31 DIAGNOSIS — M109 Gout, unspecified: Secondary | ICD-10-CM | POA: Diagnosis not present

## 2020-12-31 DIAGNOSIS — N179 Acute kidney failure, unspecified: Secondary | ICD-10-CM | POA: Diagnosis not present

## 2021-01-01 DIAGNOSIS — R300 Dysuria: Secondary | ICD-10-CM | POA: Diagnosis not present

## 2021-01-01 DIAGNOSIS — R079 Chest pain, unspecified: Secondary | ICD-10-CM | POA: Diagnosis not present

## 2021-01-01 DIAGNOSIS — I5181 Takotsubo syndrome: Secondary | ICD-10-CM | POA: Diagnosis not present

## 2021-01-02 DIAGNOSIS — R079 Chest pain, unspecified: Secondary | ICD-10-CM | POA: Diagnosis not present

## 2021-01-02 DIAGNOSIS — R300 Dysuria: Secondary | ICD-10-CM | POA: Diagnosis not present

## 2021-01-06 DIAGNOSIS — R251 Tremor, unspecified: Secondary | ICD-10-CM | POA: Diagnosis not present

## 2021-01-06 DIAGNOSIS — R404 Transient alteration of awareness: Secondary | ICD-10-CM | POA: Diagnosis not present

## 2021-01-06 DIAGNOSIS — R52 Pain, unspecified: Secondary | ICD-10-CM | POA: Diagnosis not present

## 2021-01-06 DIAGNOSIS — Z7984 Long term (current) use of oral hypoglycemic drugs: Secondary | ICD-10-CM | POA: Diagnosis not present

## 2021-01-06 DIAGNOSIS — Z743 Need for continuous supervision: Secondary | ICD-10-CM | POA: Diagnosis not present

## 2021-01-06 DIAGNOSIS — R569 Unspecified convulsions: Secondary | ICD-10-CM | POA: Diagnosis not present

## 2021-01-06 DIAGNOSIS — Z79899 Other long term (current) drug therapy: Secondary | ICD-10-CM | POA: Diagnosis not present

## 2021-01-06 DIAGNOSIS — E1151 Type 2 diabetes mellitus with diabetic peripheral angiopathy without gangrene: Secondary | ICD-10-CM | POA: Diagnosis not present

## 2021-01-06 DIAGNOSIS — J449 Chronic obstructive pulmonary disease, unspecified: Secondary | ICD-10-CM | POA: Diagnosis not present

## 2021-01-06 DIAGNOSIS — E785 Hyperlipidemia, unspecified: Secondary | ICD-10-CM | POA: Diagnosis not present

## 2021-01-06 DIAGNOSIS — I2510 Atherosclerotic heart disease of native coronary artery without angina pectoris: Secondary | ICD-10-CM | POA: Diagnosis not present

## 2021-01-06 DIAGNOSIS — Z880 Allergy status to penicillin: Secondary | ICD-10-CM | POA: Diagnosis not present

## 2021-01-06 DIAGNOSIS — Z87891 Personal history of nicotine dependence: Secondary | ICD-10-CM | POA: Diagnosis not present

## 2021-01-06 DIAGNOSIS — K219 Gastro-esophageal reflux disease without esophagitis: Secondary | ICD-10-CM | POA: Diagnosis not present

## 2021-01-06 DIAGNOSIS — I252 Old myocardial infarction: Secondary | ICD-10-CM | POA: Diagnosis not present

## 2021-01-06 DIAGNOSIS — R6889 Other general symptoms and signs: Secondary | ICD-10-CM | POA: Diagnosis not present

## 2021-01-06 DIAGNOSIS — I10 Essential (primary) hypertension: Secondary | ICD-10-CM | POA: Diagnosis not present

## 2021-01-07 ENCOUNTER — Observation Stay
Admission: EM | Admit: 2021-01-07 | Discharge: 2021-01-10 | Disposition: A | Discharge status: Home / Self Care | Payer: Medicare Other | Attending: Family Medicine | Admitting: Family Medicine

## 2021-01-07 ENCOUNTER — Other Ambulatory Visit: Payer: Self-pay

## 2021-01-07 ENCOUNTER — Emergency Department: Payer: Medicare Other

## 2021-01-07 ENCOUNTER — Encounter: Payer: Self-pay | Admitting: Emergency Medicine

## 2021-01-07 DIAGNOSIS — I499 Cardiac arrhythmia, unspecified: Secondary | ICD-10-CM | POA: Diagnosis not present

## 2021-01-07 DIAGNOSIS — Z7984 Long term (current) use of oral hypoglycemic drugs: Secondary | ICD-10-CM | POA: Diagnosis not present

## 2021-01-07 DIAGNOSIS — I509 Heart failure, unspecified: Secondary | ICD-10-CM | POA: Insufficient documentation

## 2021-01-07 DIAGNOSIS — S2243XD Multiple fractures of ribs, bilateral, subsequent encounter for fracture with routine healing: Secondary | ICD-10-CM | POA: Diagnosis not present

## 2021-01-07 DIAGNOSIS — Y900 Blood alcohol level of less than 20 mg/100 ml: Secondary | ICD-10-CM | POA: Diagnosis not present

## 2021-01-07 DIAGNOSIS — Z743 Need for continuous supervision: Secondary | ICD-10-CM | POA: Diagnosis not present

## 2021-01-07 DIAGNOSIS — I25111 Atherosclerotic heart disease of native coronary artery with angina pectoris with documented spasm: Secondary | ICD-10-CM | POA: Diagnosis not present

## 2021-01-07 DIAGNOSIS — G959 Disease of spinal cord, unspecified: Secondary | ICD-10-CM | POA: Diagnosis not present

## 2021-01-07 DIAGNOSIS — Z7982 Long term (current) use of aspirin: Secondary | ICD-10-CM | POA: Insufficient documentation

## 2021-01-07 DIAGNOSIS — J449 Chronic obstructive pulmonary disease, unspecified: Secondary | ICD-10-CM | POA: Diagnosis not present

## 2021-01-07 DIAGNOSIS — N179 Acute kidney failure, unspecified: Secondary | ICD-10-CM

## 2021-01-07 DIAGNOSIS — R6889 Other general symptoms and signs: Secondary | ICD-10-CM | POA: Diagnosis not present

## 2021-01-07 DIAGNOSIS — Z87891 Personal history of nicotine dependence: Secondary | ICD-10-CM | POA: Insufficient documentation

## 2021-01-07 DIAGNOSIS — Z20822 Contact with and (suspected) exposure to covid-19: Secondary | ICD-10-CM | POA: Diagnosis not present

## 2021-01-07 DIAGNOSIS — R569 Unspecified convulsions: Principal | ICD-10-CM

## 2021-01-07 DIAGNOSIS — R251 Tremor, unspecified: Secondary | ICD-10-CM | POA: Diagnosis not present

## 2021-01-07 DIAGNOSIS — Z9889 Other specified postprocedural states: Secondary | ICD-10-CM | POA: Diagnosis not present

## 2021-01-07 DIAGNOSIS — I10 Essential (primary) hypertension: Secondary | ICD-10-CM | POA: Diagnosis not present

## 2021-01-07 DIAGNOSIS — E119 Type 2 diabetes mellitus without complications: Secondary | ICD-10-CM | POA: Insufficient documentation

## 2021-01-07 DIAGNOSIS — I110 Hypertensive heart disease with heart failure: Secondary | ICD-10-CM | POA: Diagnosis not present

## 2021-01-07 DIAGNOSIS — Z79899 Other long term (current) drug therapy: Secondary | ICD-10-CM | POA: Diagnosis not present

## 2021-01-07 DIAGNOSIS — R404 Transient alteration of awareness: Secondary | ICD-10-CM | POA: Diagnosis not present

## 2021-01-07 LAB — COMPREHENSIVE METABOLIC PANEL
ALT: 9 U/L (ref 0–44)
AST: 20 U/L (ref 15–41)
Albumin: 3.9 g/dL (ref 3.5–5.0)
Alkaline Phosphatase: 79 U/L (ref 38–126)
Anion gap: 7 (ref 5–15)
BUN: 36 mg/dL — ABNORMAL HIGH (ref 8–23)
CO2: 22 mmol/L (ref 22–32)
Calcium: 9.5 mg/dL (ref 8.9–10.3)
Chloride: 109 mmol/L (ref 98–111)
Creatinine, Ser: 1.48 mg/dL — ABNORMAL HIGH (ref 0.44–1.00)
GFR, Estimated: 38 mL/min — ABNORMAL LOW (ref >60)
Glucose, Bld: 98 mg/dL (ref 70–99)
Potassium: 4.6 mmol/L (ref 3.5–5.1)
Sodium: 138 mmol/L (ref 135–145)
Total Bilirubin: 0.7 mg/dL (ref 0.3–1.2)
Total Protein: 6.5 g/dL (ref 6.5–8.1)

## 2021-01-07 LAB — CBC WITH DIFFERENTIAL/PLATELET
Abs Immature Granulocytes: 0.03 10*3/uL (ref 0.00–0.07)
Basophils Absolute: 0.1 10*3/uL (ref 0.0–0.1)
Basophils Relative: 1 %
Eosinophils Absolute: 0.4 10*3/uL (ref 0.0–0.5)
Eosinophils Relative: 7 %
HCT: 34.2 % — ABNORMAL LOW (ref 36.0–46.0)
Hemoglobin: 11.8 g/dL — ABNORMAL LOW (ref 12.0–15.0)
Immature Granulocytes: 1 %
Lymphocytes Relative: 38 %
Lymphs Abs: 2.2 10*3/uL (ref 0.7–4.0)
MCH: 34.2 pg — ABNORMAL HIGH (ref 26.0–34.0)
MCHC: 34.5 g/dL (ref 30.0–36.0)
MCV: 99.1 fL (ref 80.0–100.0)
Monocytes Absolute: 0.7 10*3/uL (ref 0.1–1.0)
Monocytes Relative: 13 %
Neutro Abs: 2.3 10*3/uL (ref 1.7–7.7)
Neutrophils Relative %: 40 %
Platelets: 218 10*3/uL (ref 150–400)
RBC: 3.45 MIL/uL — ABNORMAL LOW (ref 3.87–5.11)
RDW: 14.1 % (ref 11.5–15.5)
WBC: 5.7 10*3/uL (ref 4.0–10.5)
nRBC: 0 % (ref 0.0–0.2)

## 2021-01-07 LAB — SALICYLATE LEVEL: Salicylate Lvl: <7 mg/dL — ABNORMAL LOW (ref 7.0–30.0)

## 2021-01-07 LAB — TROPONIN I (HIGH SENSITIVITY)
Troponin I (High Sensitivity): 25 ng/L — ABNORMAL HIGH (ref <18)
Troponin I (High Sensitivity): 26 ng/L — ABNORMAL HIGH (ref <18)
Troponin I (High Sensitivity): 22 ng/L — ABNORMAL HIGH (ref <18)

## 2021-01-07 LAB — ETHANOL: Alcohol, Ethyl (B): <10 mg/dL (ref <10)

## 2021-01-07 LAB — ACETAMINOPHEN LEVEL: Acetaminophen (Tylenol), Serum: <10 ug/mL — ABNORMAL LOW (ref 10–30)

## 2021-01-07 MED ORDER — ONDANSETRON HCL 4 MG/2ML IJ SOLN: 4.0000 mg | Freq: Four times a day (QID) | INTRAMUSCULAR | Status: DC | PRN

## 2021-01-07 MED ORDER — SODIUM CHLORIDE 0.9% FLUSH
3.0000 mL | Freq: Two times a day (BID) | INTRAVENOUS | Status: DC
  Administered 2021-01-07 – 2021-01-10 (×5): 3 mL via INTRAVENOUS

## 2021-01-07 MED ORDER — ACETAMINOPHEN 325 MG PO TABS
650.0000 mg | ORAL_TABLET | Freq: Four times a day (QID) | ORAL | Status: DC | PRN
  Administered 2021-01-10 (×2): 650 mg via ORAL
  Filled 2021-01-07 (×2): qty 2

## 2021-01-07 MED ORDER — ENOXAPARIN SODIUM 40 MG/0.4ML ~~LOC~~ SOLN
40.0000 mg | SUBCUTANEOUS | Status: DC
  Administered 2021-01-07 – 2021-01-09 (×3): 40 mg via SUBCUTANEOUS
  Filled 2021-01-07 (×3): qty 0.4

## 2021-01-07 MED ORDER — LORAZEPAM 2 MG/ML IJ SOLN
1.0000 mg | INTRAMUSCULAR | Status: DC | PRN
  Administered 2021-01-07 – 2021-01-09 (×3): 1 mg via INTRAVENOUS
  Filled 2021-01-07 (×5): qty 1

## 2021-01-07 MED ORDER — ONDANSETRON HCL 4 MG PO TABS: 4.0000 mg | ORAL_TABLET | Freq: Four times a day (QID) | ORAL | Status: DC | PRN

## 2021-01-07 MED ORDER — PANTOPRAZOLE SODIUM 40 MG PO TBEC
40.0000 mg | DELAYED_RELEASE_TABLET | Freq: Every day | ORAL | Status: DC
  Administered 2021-01-08: 10:00:00 40 mg via ORAL
  Filled 2021-01-07: qty 1

## 2021-01-07 MED ORDER — ATORVASTATIN CALCIUM 20 MG PO TABS
80.0000 mg | ORAL_TABLET | Freq: Every day | ORAL | Status: DC
  Administered 2021-01-08 – 2021-01-10 (×3): 80 mg via ORAL
  Filled 2021-01-07 (×3): qty 4

## 2021-01-07 MED ORDER — SODIUM CHLORIDE 0.9 % IV SOLN: INTRAVENOUS | Status: DC

## 2021-01-07 MED ORDER — SODIUM CHLORIDE 0.9 % IV BOLUS
1000.0000 mL | Freq: Once | INTRAVENOUS | Status: AC
  Administered 2021-01-07: 1000 mL via INTRAVENOUS

## 2021-01-07 MED ORDER — ASPIRIN EC 81 MG PO TBEC
81.0000 mg | DELAYED_RELEASE_TABLET | Freq: Every day | ORAL | Status: DC
  Administered 2021-01-08 – 2021-01-10 (×3): 81 mg via ORAL
  Filled 2021-01-07 (×3): qty 1

## 2021-01-07 MED ORDER — ACETAMINOPHEN 650 MG RE SUPP: 650.0000 mg | Freq: Four times a day (QID) | RECTAL | Status: DC | PRN

## 2021-01-07 MED ORDER — LORAZEPAM 2 MG/ML IJ SOLN
INTRAMUSCULAR | Status: AC
  Administered 2021-01-07: 1 mg via INTRAVENOUS
  Filled 2021-01-07: qty 1

## 2021-01-07 MED ORDER — LORAZEPAM 2 MG/ML IJ SOLN: 1.0000 mg | Freq: Once | INTRAMUSCULAR | Status: AC

## 2021-01-07 MED ORDER — IPRATROPIUM-ALBUTEROL 0.5-2.5 (3) MG/3ML IN SOLN: 3.0000 mL | Freq: Four times a day (QID) | RESPIRATORY_TRACT | Status: DC | PRN

## 2021-01-07 MED ORDER — BISACODYL 5 MG PO TBEC: 5.0000 mg | DELAYED_RELEASE_TABLET | Freq: Every day | ORAL | Status: DC | PRN

## 2021-01-07 MED ORDER — LORAZEPAM 2 MG/ML IJ SOLN
1.0000 mg | Freq: Once | INTRAMUSCULAR | Status: AC
  Administered 2021-01-07: 1 mg via INTRAVENOUS

## 2021-01-07 MED ORDER — MOMETASONE FURO-FORMOTEROL FUM 100-5 MCG/ACT IN AERO
2.0000 | INHALATION_SPRAY | Freq: Two times a day (BID) | RESPIRATORY_TRACT | Status: DC
  Administered 2021-01-07 – 2021-01-09 (×5): 2 via RESPIRATORY_TRACT
  Filled 2021-01-07: qty 8.8

## 2021-01-07 NOTE — ED Notes (Signed)
Informed RN bed assigned [id number]
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
## 2021-01-07 NOTE — H&P (Addendum)
History and Physical    Crystal [name] EVO:[id number] DOB: 1950/12/25 DOA: 01/07/2021  PCP: Lonie Peak, PA-C  Patient coming from: home    Chief Complaint: seizure like activity   HPI: 70 y/o F w/ PMH of HTN, HLD, GERD, anxiety, bipolar, schizophrenia who presented w/ seizure like activity x morning of admission. Hx was obtained via pt and pt's son. Pt is very poor historian. Pt evidently had 2 episodes of shaking over her entire body that lasted about 25-30 mins. Pt was awake during these episodes but was unable to talk. There was no loss of stool or urine during these episodes.  Pt does not have hx of seizures but was previously taking klonopin for anxiety. Pt has not taken klonopin in approx 1.5 weeks as it was d/c while at [facility]. Pt was d/c from [facility] on 01/04/21 and was admitted for some psych issues. Pt denies any fever, chills, sweating, cough, chest pain, shortness of breath, nausea, vomiting, abd pain, dysuria, urinary urgency, urinary frequency, diarrhea, or constipation.    Review of Systems: As per HPI otherwise 14 point review of systems negative.    Past Medical History:  Diagnosis Date  . Angina pectoris (HCC)   . Anxiety   . ASCVD (arteriosclerotic cardiovascular disease)    single vessel  . ASD (atrial septal defect)    Small ASD with insignificant shunt  . Back pain, chronic   . Chronic diarrhea   . Chronic ischemic heart disease   . Claudication (HCC)   . COPD (chronic obstructive pulmonary disease) (HCC)   . Coronary atherosclerosis of native coronary artery   . Depression   . Diabetes mellitus without complication (HCC)   . DJD (degenerative joint disease)    ankle and foot  . Eczema   . Edema   . GERD (gastroesophageal reflux disease)   . Gout   . Hypercholesteremia   . Hypertension   . Low back pain   . MRSA carrier    Chronic  . Occlusion and stenosis of carotid artery without mention of cerebral infarction    Last carotid doppler was in  2006. Patch angioplasty right femoral artery 01/1998, patenet 09/2005 ([facility])  . Osteoarthritis   . Osteopenia   . Other and unspecified angina pectoris   . PAD (peripheral artery disease) (HCC)   . Postsurgical aortocoronary bypass status   . Postsurgical percutaneous transluminal coronary angioplasty status   . Pseudodementia   . Right carotid bruit    Negative doppler 09/2002  . SOB (shortness of breath)    ECHO 04/13/12 EF estimated at 55-60%    Past Surgical History:  Procedure Laterality Date  . CORONARY ANGIOPLASTY WITH STENT PLACEMENT  01/15/98   PTCA stent RCA, PTCA diagonal  . CORONARY ANGIOPLASTY WITH STENT PLACEMENT  08/17/00   Cutting balloon ostial RCA  . CORONARY ANGIOPLASTY WITH STENT PLACEMENT  01/19/01   Unsuccessful attempted PTCA ostial RCA, stent protruding into root  . CORONARY ANGIOPLASTY WITH STENT PLACEMENT  01/20/01   RIMA-RCA, occluded cath 10/2003  . CORONARY ANGIOPLASTY WITH STENT PLACEMENT  02/02/04   Successful PTCA/drug eluding stent ostial RCA, [facility]  . hysterectomy     vaginal, for heavy periods, with BSO  . LEFT HEART CATH AND CORONARY ANGIOGRAPHY N/A 07/02/2020   Procedure: LEFT HEART CATH AND CORONARY ANGIOGRAPHY;  Surgeon: [place], Peter M, MD;  Location: [facility] INVASIVE CV LAB;  Service: Cardiovascular;  Laterality: N/A;  . right leg vein surgery for DVT    .  TUBAL LIGATION       reports that she quit smoking about 10 years ago. Her smoking use included cigarettes. She has a 80.00 pack-year smoking history. She has never used smokeless tobacco. She reports that she does not drink alcohol and does not use drugs.  Allergies  Allergen Reactions  . Bactrim [Sulfamethoxazole-Trimethoprim] Nausea And Vomiting  . Penicillins Other (See Comments)    Unknown   . Sulfa Antibiotics Other (See Comments)    unknown    Family History  Problem Relation Age of Onset  . Heart attack Mother   . Hypercholesterolemia Mother   . Hypertension Mother   .  Dementia Neg Hx   . Alzheimer's disease Neg Hx      Prior to Admission medications   Medication Sig Start Date End Date Taking? Authorizing Provider  albuterol (PROVENTIL HFA;VENTOLIN HFA) 108 (90 BASE) MCG/ACT inhaler Inhale 2 puffs into the lungs every 6 (six) hours as needed for wheezing.   Yes [provider]  diphenoxylate-atropine (LOMOTIL) 2.5-0.025 MG tablet Take 1 tablet by mouth 4 (four) times daily as needed. 06/02/20  Yes [provider]  indomethacin (INDOCIN) 50 MG capsule Take 50 mg by mouth 3 (three) times daily as needed.   Yes [provider]  meclizine (ANTIVERT) 25 MG tablet Take 25 mg by mouth 3 (three) times daily as needed for dizziness. 01/08/16  Yes [provider]  nitroGLYCERIN (NITROSTAT) 0.4 MG SL tablet PLACE 1 TABLET UNDER TONGUE EVERY 5 MINS, UP TO 3 DOSES AS NEEDED FOR CHEST PAIN 09/06/20  Yes Jake Bathe, MD  allopurinol (ZYLOPRIM) 300 MG tablet Take 300 mg by mouth daily. 08/09/19   [provider]  aspirin 81 MG tablet Take 81 mg by mouth daily.    [provider]  atorvastatin (LIPITOR) 80 MG tablet Take 1 tablet (80 mg total) by mouth daily. 03/11/18   Jake Bathe, MD  budesonide-formoterol (SYMBICORT) 160-4.5 MCG/ACT inhaler Inhale 2 puffs into the lungs 2 (two) times daily.    [provider]  buPROPion (WELLBUTRIN XL) 300 MG 24 hr tablet Take 300 mg by mouth daily. Patient not taking: No sig reported 01/02/16   [provider]  calcium elemental as carbonate (BARIATRIC TUMS ULTRA) 400 MG chewable tablet Chew by mouth.    [provider]  CALCIUM PO Take 1,000 mg by mouth daily.    [provider]  calcium-vitamin D (OSCAL WITH D) 500-200 MG-UNIT per tablet Take 1 tablet by mouth daily. 500 mg by mouth daily Patient not taking: No sig reported    [provider]  cholecalciferol (VITAMIN D) 400 UNITS TABS tablet Take 1,000 Units by mouth daily.    [provider]  Cholecalciferol (VITAMIN D3) 10 MCG (400 UNIT) tablet Take by mouth.    [provider]  clonazePAM (KLONOPIN) 1 MG tablet Take 1 mg by mouth daily as needed for anxiety.  Patient not taking: No sig reported    [provider]  doxycycline (VIBRA-TABS) 100 MG tablet Take 100 mg by mouth 2 (two) times daily. Patient not taking: No sig reported 06/26/20   [provider]  DULoxetine (CYMBALTA) 60 MG capsule Take 1 capsule (60 mg total) by mouth daily. Patient not taking: No sig reported 12/12/12   Lorane Gell, MD  esomeprazole (NEXIUM) 40 MG capsule Take 40 mg by mouth in the morning and at bedtime.    [provider]  EUCRISA 2 % OINT  Apply 1 application topically daily as needed.  10/28/17   [provider]  famotidine (PEPCID) 40 MG tablet Take 40 mg by mouth daily. 06/20/19   [provider]  furosemide (LASIX) 20 MG tablet Take by mouth.    [provider]  isosorbide mononitrate (IMDUR) 60 MG 24 hr tablet Take 1 tablet (60 mg total) by mouth daily. 03/11/18   Jake BatheSkains, Mark C, MD  metoprolol succinate (TOPROL-XL) 50 MG 24 hr tablet Take two (2) tablet (100 mg) by mouth daily. Patient not taking: No sig reported 03/11/18   Jake BatheSkains, Mark C, MD  nitrofurantoin, macrocrystal-monohydrate, (MACROBID) 100 MG capsule Take 1 capsule (100 mg total) by mouth 2 (two) times daily. Patient not taking: No sig reported 12/17/20   Anson FretAhern, Antonia B, MD  OLANZapine (ZYPREXA) 2.5 MG tablet Take 2.5 mg by mouth at bedtime. 01/04/21   [provider]  pantoprazole (PROTONIX) 40 MG tablet Take 1 tablet (40 mg total) by mouth 2 (two) times daily. Patient not taking: No sig reported 11/26/17   Robbie LisSimmons, Brittainy M, PA-C  QUEtiapine (SEROQUEL) 50 MG tablet Take 50 mg by mouth at bedtime. Patient not taking: No sig reported 06/02/20   [provider]  Tiotropium Bromide Monohydrate (SPIRIVA RESPIMAT) 2.5 MCG/ACT AERS Inhale 2 puffs  into the lungs daily.    [provider]  tiZANidine (ZANAFLEX) 4 MG tablet Take 4 mg by mouth 3 (three) times daily as needed for muscle spasms.  Patient not taking: No sig reported 06/02/20   [provider]  vitamin B-12 (CYANOCOBALAMIN) 1000 MCG tablet Take 1,000 mcg by mouth in the morning and at bedtime.    [provider]    Physical Exam: Vitals:   01/07/21 1203 01/07/21 1209 01/07/21 1230 01/07/21 1330  BP:  (!) 153/122 (!) 172/69 (!) 106/44  Pulse:  71 69 85  Resp:  18 10 19   Temp:  98.5 F (36.9 C)    TempSrc:  Oral    SpO2: 98% 97% 100% 100%  Weight:  17.5 kg    Height:  5' (1.524 m)      Constitutional: NAD, comfortable but agitated  Vitals:   01/07/21 1203 01/07/21 1209 01/07/21 1230 01/07/21 1330  BP:  (!) 153/122 (!) 172/69 (!) 106/44  Pulse:  71 69 85  Resp:  18 10 19   Temp:  98.5 F (36.9 C)    TempSrc:  Oral    SpO2: 98% 97% 100% 100%  Weight:  17.5 kg    Height:  5' (1.524 m)     Eyes: PERRL, lids and conjunctivae normal ENMT: Mucous membranes are moist.  Neck: normal, supple Respiratory: clear to auscultation bilaterally, no wheezing, no crackles. Normal respiratory effort. No accessory muscle use.  Cardiovascular: S1/S2+, no rubs / gallops. No extremity edema.  Abdomen: soft, no tenderness, ND & hypoactive bowel sounds  Musculoskeletal: no clubbing / cyanosis. No joint deformity upper and lower extremities. Normal muscle tone.  Skin: no rashes, lesions Neurologic: CN 2-12 grossly intact. Moves all extremities  Psychiatric: Normal judgment and insight. Alert and oriented x 3. Agitated    Labs on Admission: I have personally reviewed following labs and imaging studies  CBC: Recent Labs  Lab 01/07/21 1213  WBC 5.7  NEUTROABS 2.3  HGB 11.8*  HCT 34.2*  MCV 99.1  PLT 218   Basic Metabolic Panel: Recent Labs  Lab 01/07/21 1249  NA 138  K 4.6  CL 109  CO2 22  GLUCOSE  98  BUN 36*  CREATININE 1.48*  CALCIUM 9.5    GFR: Estimated Creatinine Clearance: 9.8 mL/min (A) (by C-G formula based on SCr of 1.48 mg/dL (H)). Liver Function Tests: Recent Labs  Lab 01/07/21 1249  AST 20  ALT 9  ALKPHOS 79  BILITOT 0.7  PROT 6.5  ALBUMIN 3.9   No results for input(s): LIPASE, AMYLASE in the last 168 hours. No results for input(s): AMMONIA in the last 168 hours. Coagulation Profile: No results for input(s): INR, PROTIME in the last 168 hours. Cardiac Enzymes: No results for input(s): CKTOTAL, CKMB, CKMBINDEX, TROPONINI in the last 168 hours. BNP (last 3 results) No results for input(s): PROBNP in the last 8760 hours. HbA1C: No results for input(s): HGBA1C in the last 72 hours. CBG: No results for input(s): GLUCAP in the last 168 hours. Lipid Profile: No results for input(s): CHOL, HDL, LDLCALC, TRIG, CHOLHDL, LDLDIRECT in the last 72 hours. Thyroid Function Tests: No results for input(s): TSH, T4TOTAL, FREET4, T3FREE, THYROIDAB in the last 72 hours. Anemia Panel: No results for input(s): VITAMINB12, FOLATE, FERRITIN, TIBC, IRON, RETICCTPCT in the last 72 hours. Urine analysis:    Component Value Date/Time   COLORURINE AMBER (A) 12/09/2012 1621   APPEARANCEUR Clear 12/12/2020 1020   LABSPEC 1.028 12/09/2012 1621   PHURINE 5.5 12/09/2012 1621   GLUCOSEU Negative 12/12/2020 1020   HGBUR SMALL (A) 12/09/2012 1621   BILIRUBINUR Negative 12/12/2020 1020   KETONESUR 15 (A) 12/09/2012 1621   PROTEINUR Trace 12/12/2020 1020   PROTEINUR NEGATIVE 12/09/2012 1621   UROBILINOGEN 1.0 12/09/2012 1621   NITRITE Negative 12/12/2020 1020   NITRITE NEGATIVE 12/09/2012 1621   LEUKOCYTESUR Trace (A) 12/12/2020 1020    Radiological Exams on Admission: CT Head Wo Contrast  Result Date: 01/07/2021 CLINICAL DATA:  Tremors EXAM: CT HEAD WITHOUT CONTRAST TECHNIQUE: Contiguous axial images were obtained from the base of the skull through the vertex without intravenous contrast. COMPARISON:  08/10/2020 FINDINGS:  Brain: No evidence of acute infarction, hemorrhage, hydrocephalus, extra-axial collection or mass lesion/mass effect. Vascular: No hyperdense vessel or unexpected calcification. Skull: Normal. Negative for fracture or focal lesion. Sinuses/Orbits: No acute finding. Other: None. IMPRESSION: No acute intracranial abnormality is noted. Electronically Signed   By: Alcide Clever M.D.   On: 01/07/2021 14:52   DG Chest Port 1 View  Result Date: 01/07/2021 CLINICAL DATA:  Recent tremors EXAM: PORTABLE CHEST 1 VIEW COMPARISON:  08/30/2020 FINDINGS: Postsurgical changes are again noted and stable. Cardiac shadow is within normal limits. The lungs are clear. Old rib fractures are noted bilaterally with healing. No acute bony abnormality is seen. IMPRESSION: No active disease. Electronically Signed   By: Alcide Clever M.D.   On: 01/07/2021 14:54    EKG: Independently reviewed.   Assessment/Plan Active Problems:   * No active hospital problems. *   Possible seizure vs pseudoseizure:  pt was awake during these episodes but unable to talk. Will consult neuro in AM as neuro stops seeing pt at 4pm. IV ativan prn. Seizure precautions. CT head neg for any acute intracranial findings   Bipolar disorder: unknown severity or type. Was recently at [facility] for psych issues. Will restart home dose of olanzapine once verified by med rec  Schizophrenia: unknown type or severity. Will restart home dose of olanzapine once verified by med rec   HTN: will hold all home anti-HTN as BP is low end of normal  HLD: will continue on statin   GERD: will continue on  PPI   Hx of CHF: unknown systolic vs diastolic vs combined. Will hold home dose of lasix secondary to AKI  AKI: baseline Cr/GFR is unknown. Started on IVFs. Avoid nephrotoxic meds       DVT prophylaxis: lovenox  Code Status: full  Family Communication:  Disposition Plan: depends on PT/OT recs  Consults called: needs neuro consult in AM  Admission status:  observation    Charise Killian MD [facility]   If 7PM-7AM, please contact night-coverage   01/07/2021, 3:59 PM

## 2021-01-07 NOTE — ED Notes (Signed)
Seizure pads placed at pt bedside.

## 2021-01-07 NOTE — ED Triage Notes (Signed)
Patient arrives via EMS from home for tremors. Patient was seen at a different hospital last night for same and previously seen 1 week ago and was IVC'd at [facility]. Patient have a history of dementia and schizophrenia. No tremors noted at this time.

## 2021-01-08 DIAGNOSIS — N179 Acute kidney failure, unspecified: Secondary | ICD-10-CM | POA: Diagnosis not present

## 2021-01-08 DIAGNOSIS — R569 Unspecified convulsions: Secondary | ICD-10-CM | POA: Diagnosis not present

## 2021-01-08 LAB — URINALYSIS, COMPLETE (UACMP) WITH MICROSCOPIC
Bacteria, UA: NONE SEEN
Bilirubin Urine: NEGATIVE
Glucose, UA: NEGATIVE mg/dL
Hgb urine dipstick: NEGATIVE
Ketones, ur: NEGATIVE mg/dL
Leukocytes,Ua: NEGATIVE
Nitrite: NEGATIVE
Protein, ur: NEGATIVE mg/dL
Specific Gravity, Urine: 1.012 (ref 1.005–1.030)
pH: 5 (ref 5.0–8.0)

## 2021-01-08 LAB — URINE DRUG SCREEN, QUALITATIVE (ARMC ONLY)
Amphetamines, Ur Screen: NOT DETECTED
Barbiturates, Ur Screen: NOT DETECTED
Benzodiazepine, Ur Scrn: POSITIVE — AB
Cannabinoid 50 Ng, Ur ~~LOC~~: NOT DETECTED
Cocaine Metabolite,Ur ~~LOC~~: NOT DETECTED
MDMA (Ecstasy)Ur Screen: NOT DETECTED
Methadone Scn, Ur: NOT DETECTED
Opiate, Ur Screen: NOT DETECTED
Phencyclidine (PCP) Ur S: NOT DETECTED
Tricyclic, Ur Screen: NOT DETECTED

## 2021-01-08 LAB — BASIC METABOLIC PANEL
Anion gap: 7 (ref 5–15)
BUN: 30 mg/dL — ABNORMAL HIGH (ref 8–23)
CO2: 21 mmol/L — ABNORMAL LOW (ref 22–32)
Calcium: 8.9 mg/dL (ref 8.9–10.3)
Chloride: 113 mmol/L — ABNORMAL HIGH (ref 98–111)
Creatinine, Ser: 1.28 mg/dL — ABNORMAL HIGH (ref 0.44–1.00)
GFR, Estimated: 45 mL/min — ABNORMAL LOW (ref >60)
Glucose, Bld: 109 mg/dL — ABNORMAL HIGH (ref 70–99)
Potassium: 5 mmol/L (ref 3.5–5.1)
Sodium: 141 mmol/L (ref 135–145)

## 2021-01-08 LAB — CBC
HCT: 30.7 % — ABNORMAL LOW (ref 36.0–46.0)
Hemoglobin: 10.8 g/dL — ABNORMAL LOW (ref 12.0–15.0)
MCH: 35 pg — ABNORMAL HIGH (ref 26.0–34.0)
MCHC: 35.2 g/dL (ref 30.0–36.0)
MCV: 99.4 fL (ref 80.0–100.0)
Platelets: 195 10*3/uL (ref 150–400)
RBC: 3.09 MIL/uL — ABNORMAL LOW (ref 3.87–5.11)
RDW: 14.1 % (ref 11.5–15.5)
WBC: 5.1 10*3/uL (ref 4.0–10.5)
nRBC: 0 % (ref 0.0–0.2)

## 2021-01-08 LAB — GLUCOSE, CAPILLARY: Glucose-Capillary: 101 mg/dL — ABNORMAL HIGH (ref 70–99)

## 2021-01-08 LAB — RESP PANEL BY RT-PCR (FLU A&B, COVID) ARPGX2
Influenza A by PCR: NEGATIVE
Influenza B by PCR: NEGATIVE
SARS Coronavirus 2 by RT PCR: NEGATIVE

## 2021-01-08 MED ORDER — LORAZEPAM 2 MG/ML IJ SOLN
2.0000 mg | Freq: Once | INTRAMUSCULAR | Status: AC
  Administered 2021-01-08: 12:00:00 2 mg via INTRAVENOUS

## 2021-01-08 MED ORDER — ISOSORBIDE MONONITRATE ER 30 MG PO TB24
60.0000 mg | ORAL_TABLET | Freq: Every day | ORAL | Status: DC
  Administered 2021-01-09 – 2021-01-10 (×2): 60 mg via ORAL
  Filled 2021-01-08 (×2): qty 2

## 2021-01-08 MED ORDER — ENSURE ENLIVE PO LIQD
237.0000 mL | Freq: Three times a day (TID) | ORAL | Status: DC
  Administered 2021-01-08 – 2021-01-10 (×4): 237 mL via ORAL

## 2021-01-08 MED ORDER — ADULT MULTIVITAMIN W/MINERALS CH: 1.0000 | ORAL_TABLET | Freq: Every day | ORAL | Status: DC

## 2021-01-08 MED ORDER — LEVETIRACETAM IN NACL 500 MG/100ML IV SOLN
500.0000 mg | Freq: Two times a day (BID) | INTRAVENOUS | Status: DC
  Administered 2021-01-08: 12:00:00 500 mg via INTRAVENOUS
  Filled 2021-01-08 (×2): qty 100

## 2021-01-08 MED ORDER — OLANZAPINE 2.5 MG PO TABS
2.5000 mg | ORAL_TABLET | Freq: Every day | ORAL | Status: DC
  Administered 2021-01-08 – 2021-01-09 (×2): 2.5 mg via ORAL
  Filled 2021-01-08 (×3): qty 1

## 2021-01-08 MED ORDER — LORAZEPAM 2 MG/ML IJ SOLN
1.0000 mg | Freq: Once | INTRAMUSCULAR | Status: AC
  Administered 2021-01-08: 12:00:00 1 mg via INTRAVENOUS

## 2021-01-08 MED ORDER — DULOXETINE HCL 30 MG PO CPEP: 60.0000 mg | ORAL_CAPSULE | Freq: Every day | ORAL | Status: DC

## 2021-01-08 NOTE — Progress Notes (Signed)
Family notified nursing staff that patient was having another seizure.  Acticity started 1151 and ended 1157.  MD notified and gave one time order for 1mg  ativan IV and ordered IV keppra.  Will continue to monitor and carry out plan of care.

## 2021-01-08 NOTE — Progress Notes (Addendum)
OT called me to room, patient was having seizure like activity, mainly in arms and legs involuntary movement. Patient unresponsive during episode. Was able to answer questions appropriately post episode. Episode lasted approximately 4 minutes. Ativan 1 mg IV was given, patient responded to medication given. VS were WDL. MD was paged. Neurology is to see her today. Tele-sitter ordered. Will continue to monitor.   Addendum-Patient continued to have 3 more episodes of seizure activity within 45 minutes. RRT was called,Ativan administered, attending came to bedside, Neurology also consulted. EEG ordered. Tele-sitter monitoring in place. Patient is currently resting at bedside.

## 2021-01-08 NOTE — Progress Notes (Signed)
PT Cancellation Note  Patient Details Name: Crystal [name] MRN: [id number] DOB: 21-Oct-1950   Cancelled Treatment:    Reason Eval/Treat Not Completed: Patient not medically ready. Patient has had multiple seizure-like episodes documented today. PT will hold off today and follow up tomorrow as appropriate.   Donna Bernard, PT, MPT  Ina Homes 01/08/2021, 1:13 PM

## 2021-01-08 NOTE — Progress Notes (Signed)
PROGRESS NOTE    Crystal [name]  QQI:[id number] DOB: 1951-05-09 DOA: 01/07/2021 PCP: Lonie Peak, PA-C   70 y/o F w/ PMH of HTN, HLD, GERD, anxiety, bipolar, schizophrenia who presented w/ seizure like activity x morning of admission. Hx was obtained via pt and pt's son. Pt is very poor historian. Pt evidently had 2 episodes of shaking over her entire body that lasted about 25-30 mins. Pt was awake during these episodes but was unable to talk. There was no loss of stool or urine during these episodes.  Pt does not have hx of seizures but was previously taking klonopin for anxiety. Pt has not taken klonopin in approx 1.5 weeks as it was d/c while at [facility]. Pt was d/c from [facility] on 01/04/21 and was admitted for some psych issues. Pt denies any fever, chills, sweating, cough, chest pain, shortness of breath, nausea, vomiting, abd pain, dysuria, urinary urgency, urinary frequency, diarrhea, or constipation.   Assessment & Plan:   Active Problems:   Seizure (HCC)   Possible seizure vs pseudoseizure:  pt was awake during these episodes but unable to talk as per pt's son. No post-ictal state. IV ativan prn. Started on IV keppra. Neuro consulted. Seizure precautions. CT head neg for any acute intracranial findings   Bipolar disorder: unknown severity or type. Was recently at [facility] for psych issues. Will restart home dose of olanzapine once verified by med rec  Schizophrenia: unknown type or severity. Restarted home dose of olanzapine   HTN: restarted on home dose of imdur   HLD: will continue on statin    Hx of CHF: unknown systolic vs diastolic vs combined. Will hold home dose of lasix secondary to AKI  AKI: baseline Cr/GFR is unknown. Continue on IVFs. Cr is trending down today. Avoid nephrotoxic meds    DVT prophylaxis: lovenox  Code Status: full  Family Communication: discussed pt's care w/ pt's son at bedside and answered his questions  Disposition Plan: depends on PT/OT  recs  Level of care: Med-Surg  Consultants:   Neuro    Procedures:    Antimicrobials:   Subjective: Pt c/o fatigue  Objective: Vitals:   01/07/21 1743 01/07/21 1942 01/08/21 0000 01/08/21 0442  BP: (!) 125/52 (!) 134/43 (!) 102/56 133/63  Pulse: 64 72 70 75  Resp:  16 16 18   Temp:  (!) 97.3 F (36.3 C) 97.7 F (36.5 C) 98.6 F (37 C)  TempSrc:  Oral  Oral  SpO2: 100% 100% 98% 99%  Weight:      Height:        Intake/Output Summary (Last 24 hours) at 01/08/2021 0719 Last data filed at 01/08/2021 0644 Gross per 24 hour  Intake 1000 ml  Output 800 ml  Net 200 ml   Filed Weights   01/07/21 1209 01/07/21 1700  Weight: 17.5 kg 66.2 kg    Examination:  General exam: Appears calm and comfortable  Respiratory system: Clear to auscultation. Respiratory effort normal. Cardiovascular system: S1 & S2 +. No  rubs, gallops or clicks.  Gastrointestinal system: Abdomen is nondistended, soft and nontender.  Normal bowel sounds heard. Central nervous system: Alert and oriented. Moves all 4 extremities  Psychiatry: Judgement and insight appear normal. Flat mood and affect     Data Reviewed: I have personally reviewed following labs and imaging studies  CBC: Recent Labs  Lab 01/07/21 1213 01/08/21 0527  WBC 5.7 5.1  NEUTROABS 2.3  --   HGB 11.8* 10.8*  HCT  34.2* 30.7*  MCV 99.1 99.4  PLT 218 195   Basic Metabolic Panel: Recent Labs  Lab 01/07/21 1249 01/08/21 0527  NA 138 141  K 4.6 5.0  CL 109 113*  CO2 22 21*  GLUCOSE 98 109*  BUN 36* 30*  CREATININE 1.48* 1.28*  CALCIUM 9.5 8.9   GFR: Estimated Creatinine Clearance: 34.7 mL/min (A) (by C-G formula based on SCr of 1.28 mg/dL (H)). Liver Function Tests: Recent Labs  Lab 01/07/21 1249  AST 20  ALT 9  ALKPHOS 79  BILITOT 0.7  PROT 6.5  ALBUMIN 3.9   No results for input(s): LIPASE, AMYLASE in the last 168 hours. No results for input(s): AMMONIA in the last 168 hours. Coagulation Profile: No  results for input(s): INR, PROTIME in the last 168 hours. Cardiac Enzymes: No results for input(s): CKTOTAL, CKMB, CKMBINDEX, TROPONINI in the last 168 hours. BNP (last 3 results) No results for input(s): PROBNP in the last 8760 hours. HbA1C: No results for input(s): HGBA1C in the last 72 hours. CBG: No results for input(s): GLUCAP in the last 168 hours. Lipid Profile: No results for input(s): CHOL, HDL, LDLCALC, TRIG, CHOLHDL, LDLDIRECT in the last 72 hours. Thyroid Function Tests: No results for input(s): TSH, T4TOTAL, FREET4, T3FREE, THYROIDAB in the last 72 hours. Anemia Panel: No results for input(s): VITAMINB12, FOLATE, FERRITIN, TIBC, IRON, RETICCTPCT in the last 72 hours. Sepsis Labs: No results for input(s): PROCALCITON, LATICACIDVEN in the last 168 hours.  Recent Results (from the past 240 hour(s))  Resp Panel by RT-PCR (Flu A&B, Covid) Nasopharyngeal Swab     Status: None   Collection Time: 01/07/21  1:08 PM   Specimen: Nasopharyngeal Swab; Nasopharyngeal(NP) swabs in vial transport medium  Result Value Ref Range Status   SARS Coronavirus 2 by RT PCR NEGATIVE NEGATIVE Final    Comment: (NOTE) SARS-CoV-2 target nucleic acids are NOT DETECTED.  The SARS-CoV-2 RNA is generally detectable in upper respiratory specimens during the acute phase of infection. The lowest concentration of SARS-CoV-2 viral copies this assay can detect is 138 copies/mL. A negative result does not preclude SARS-Cov-2 infection and should not be used as the sole basis for treatment or other patient management decisions. A negative result may occur with  improper specimen collection/handling, submission of specimen other than nasopharyngeal swab, presence of viral mutation(s) within the areas targeted by this assay, and inadequate number of viral copies(<138 copies/mL). A negative result must be combined with clinical observations, patient history, and epidemiological information. The expected  result is Negative.  Fact Sheet for Patients:  BloggerCourse.com  Fact Sheet for Healthcare Providers:  SeriousBroker.it  This test is no t yet approved or cleared by the [place] FDA and  has been authorized for detection and/or diagnosis of SARS-CoV-2 by FDA under an Emergency Use Authorization (EUA). This EUA will remain  in effect (meaning this test can be used) for the duration of the COVID-19 declaration under Section 564(b)(1) of the Act, 21 U.S.C.section 360bbb-3(b)(1), unless the authorization is terminated  or revoked sooner.       Influenza A by PCR NEGATIVE NEGATIVE Final   Influenza B by PCR NEGATIVE NEGATIVE Final    Comment: (NOTE) The Xpert Xpress SARS-CoV-2/FLU/RSV plus assay is intended as an aid in the diagnosis of influenza from Nasopharyngeal swab specimens and should not be used as a sole basis for treatment. Nasal washings and aspirates are unacceptable for Xpert Xpress SARS-CoV-2/FLU/RSV testing.  Fact Sheet for Patients: BloggerCourse.com  Fact Sheet  for Healthcare Providers: SeriousBroker.it  This test is not yet approved or cleared by the [place] and has been authorized for detection and/or diagnosis of SARS-CoV-2 by FDA under an Emergency Use Authorization (EUA). This EUA will remain in effect (meaning this test can be used) for the duration of the COVID-19 declaration under Section 564(b)(1) of the Act, 21 U.S.C. section 360bbb-3(b)(1), unless the authorization is terminated or revoked.  Performed at [facility], [address], [place], [place] [address]          Radiology Studies: CT Head Wo Contrast  Result Date: 01/07/2021 CLINICAL DATA:  Tremors EXAM: CT HEAD WITHOUT CONTRAST TECHNIQUE: Contiguous axial images were obtained from the base of the skull through the vertex without intravenous contrast.  COMPARISON:  08/10/2020 FINDINGS: Brain: No evidence of acute infarction, hemorrhage, hydrocephalus, extra-axial collection or mass lesion/mass effect. Vascular: No hyperdense vessel or unexpected calcification. Skull: Normal. Negative for fracture or focal lesion. Sinuses/Orbits: No acute finding. Other: None. IMPRESSION: No acute intracranial abnormality is noted. Electronically Signed   By: Alcide Clever M.D.   On: 01/07/2021 14:52   DG Chest Port 1 View  Result Date: 01/07/2021 CLINICAL DATA:  Recent tremors EXAM: PORTABLE CHEST 1 VIEW COMPARISON:  08/30/2020 FINDINGS: Postsurgical changes are again noted and stable. Cardiac shadow is within normal limits. The lungs are clear. Old rib fractures are noted bilaterally with healing. No acute bony abnormality is seen. IMPRESSION: No active disease. Electronically Signed   By: Alcide Clever M.D.   On: 01/07/2021 14:54        Scheduled Meds: . aspirin EC  81 mg Oral Daily  . atorvastatin  80 mg Oral Daily  . enoxaparin (LOVENOX) injection  40 mg Subcutaneous Q24H  . mometasone-formoterol  2 puff Inhalation BID  . pantoprazole  40 mg Oral Daily  . sodium chloride flush  3 mL Intravenous Q12H   Continuous Infusions: . sodium chloride 75 mL/hr at 01/07/21 1811     LOS: 0 days    Time spent: 35 mins     Charise Killian, MD [facility]   If 7PM-7AM, please contact night-coverage 01/08/2021, 7:19 AM

## 2021-01-08 NOTE — Progress Notes (Signed)
eeg done °

## 2021-01-08 NOTE — Progress Notes (Signed)
Pt. A/Ox4 can verbally respond with clear speech and able to feed self. Witness pt having a seizure at beginning of shift, symptoms noted were staring spell, confusion, and jerking of arms lasting around 5 minutes. Medicated x1 with prn IV Ativan, patient tolerated well, and was asleep most of shift til this morning. Reassessed patient A/Ox4 in pleasant mood, talking with staff, and no s/s of confusion noted.

## 2021-01-08 NOTE — Consult Note (Addendum)
NEUROLOGY CONSULTATION NOTE   Date of service: January 08, 2021 Patient Name: Crystal [name] MRN:  [id number] DOB:  01/05/1951 Reason for consult: "Seizure" _ _ _   _ __   _ __ _ _  __ __   _ __   __ _  History of Present Illness  Crystal [name] is a 70 y.o. female with PMH significant for anxiety, bipolar, ischemic heart disease, COPD, CAD, depression, diabetes, GERD, gout, hypercholesterolemia, hypertension peripheral arterial disease who presents with an episode concerning for seizure.  She was recently at [facility] where she was treated for a UTI, was seen by Psychiatry at that time for SI and Psychosis with bouts of vulgarity and aggression. She was taken off of Klonopin, switched to Zyprexa from Seroquel. Wellbutrin and Cymbalata were also held for concerns that polypharmacy were also contributing to these. Psychiatry thought that most of her symptoms were due to UTI and she was discharged.  After discharge, patient was seen in [facility] at [facility] for a seizure like activity. This was thought to either be related to Klonopin withdrawal or a Non epileptic event.  I spoke to patient's son at the bedside. They report that yesterday, patient had a 20-25 mins [place] episode concerning for a seizure. They were able to capture it on the camera that they have at home. The video demonstrates BL arms jerking that is arhythmic with arhythmic leg jerking. She had another similar episode at home and then a couple in the ED and then 4 episodes in a 45 mins period today. Patient reports that she can feel on her seizure coming when they happen. She is aware throughout the period. It starts off as her R hand shaking, then quickly spreads to the rest of the body. She is aware that her entire body is shaking. She is post ictal for 10 mins and then is back to her baseline.  She was given Ativan and Keppra here.CTH w/o contrast here was negative for any abnormality. MRI Brain and MRI C spine recently completed on 12/25/20 with a  normal MRI Brain with and without contrast and MR C spine with C2-3 Cervical Myelopathy. She has a referral to see Neurosurgery but yet to see them. Patient and son will get her scheduled soon they report.  She denies any prior hx of seizures, no hx of strokes, no ICH, no hx of CNS infections, no personal or family hx of seizures. She does not drink alcohol. She did fall of the trailer about a month and and hit her head and her neck but did not lose consciousness.  On further questioning, she does endorse significant stressors including losing her sister, her son and her mom 2 years ago. She reports that she doe sthink about it. Endorses that Klonopin was helping with her anxiety and flash backs and she understands that she should not be on it for [place] term. Sons do express some concerns regarding her using the Klonopin a bit too much. They report that she does have a bottle and she would take as much of it as needed. Sometimes that would be just 1 pill and other times, would take as much as 5 pills. They report that when she is hospitalized, she is not using her home Klonopin so she did have quite a lot of it accumulated and would use that to take it as needed.   ROS   Constitutional Denies weight loss, fever and chills.   HEENT Denies changes  in vision and hearing.   Respiratory Denies SOB and cough.   CV Denies palpitations and CP   GI Denies abdominal pain, nausea, vomiting and diarrhea.   GU Denies dysuria and urinary frequency.   MSK Denies myalgia and joint pain.   Skin Denies rash and pruritus.   Neurological Denies headache and syncope.   Psychiatric Denies recent changes in mood. Denies anxiety and depression.    Past History   Past Medical History:  Diagnosis Date  . Angina pectoris (HCC)   . Anxiety   . ASCVD (arteriosclerotic cardiovascular disease)    single vessel  . ASD (atrial septal defect)    Small ASD with insignificant shunt  . Back pain, chronic   . Chronic  diarrhea   . Chronic ischemic heart disease   . Claudication (HCC)   . COPD (chronic obstructive pulmonary disease) (HCC)   . Coronary atherosclerosis of native coronary artery   . Depression   . Diabetes mellitus without complication (HCC)   . DJD (degenerative joint disease)    ankle and foot  . Eczema   . Edema   . GERD (gastroesophageal reflux disease)   . Gout   . Hypercholesteremia   . Hypertension   . Low back pain   . MRSA carrier    Chronic  . Occlusion and stenosis of carotid artery without mention of cerebral infarction    Last carotid doppler was in 2006. Patch angioplasty right femoral artery 01/1998, patenet 09/2005 ([facility])  . Osteoarthritis   . Osteopenia   . Other and unspecified angina pectoris   . PAD (peripheral artery disease) (HCC)   . Postsurgical aortocoronary bypass status   . Postsurgical percutaneous transluminal coronary angioplasty status   . Pseudodementia   . Right carotid bruit    Negative doppler 09/2002  . SOB (shortness of breath)    ECHO 04/13/12 EF estimated at 55-60%   Past Surgical History:  Procedure Laterality Date  . CORONARY ANGIOPLASTY WITH STENT PLACEMENT  01/15/98   PTCA stent RCA, PTCA diagonal  . CORONARY ANGIOPLASTY WITH STENT PLACEMENT  08/17/00   Cutting balloon ostial RCA  . CORONARY ANGIOPLASTY WITH STENT PLACEMENT  01/19/01   Unsuccessful attempted PTCA ostial RCA, stent protruding into root  . CORONARY ANGIOPLASTY WITH STENT PLACEMENT  01/20/01   RIMA-RCA, occluded cath 10/2003  . CORONARY ANGIOPLASTY WITH STENT PLACEMENT  02/02/04   Successful PTCA/drug eluding stent ostial RCA, [facility]  . hysterectomy     vaginal, for heavy periods, with BSO  . LEFT HEART CATH AND CORONARY ANGIOGRAPHY N/A 07/02/2020   Procedure: LEFT HEART CATH AND CORONARY ANGIOGRAPHY;  Surgeon: [place], Peter M, MD;  Location: [facility] INVASIVE CV LAB;  Service: Cardiovascular;  Laterality: N/A;  . right leg vein surgery for DVT    . TUBAL LIGATION      Family History  Problem Relation Age of Onset  . Heart attack Mother   . Hypercholesterolemia Mother   . Hypertension Mother   . Dementia Neg Hx   . Alzheimer's disease Neg Hx    Social History   Socioeconomic History  . Marital status: Divorced    Spouse name: Not on file  . Number of children: Not on file  . Years of education: Not on file  . Highest education level: High school graduate  Occupational History  . Not on file  Tobacco Use  . Smoking status: Former Smoker    Packs/day: 2.00    Years: 40.00  Pack years: 80.00    Types: Cigarettes    Quit date: 09/29/2010    Years since quitting: 10.2  . Smokeless tobacco: Never Used  Substance and Sexual Activity  . Alcohol use: No  . Drug use: No  . Sexual activity: Not on file  Other Topics Concern  . Not on file  Social History Narrative   Lives alone   Right handed   Caffeine: several colas or pepsi per day   Social Determinants of Health   Financial Resource Strain: Not on file  Food Insecurity: Not on file  Transportation Needs: Not on file  Physical Activity: Not on file  Stress: Not on file  Social Connections: Not on file   Allergies  Allergen Reactions  . Bactrim [Sulfamethoxazole-Trimethoprim] Nausea And Vomiting  . Penicillins Other (See Comments)    Unknown   . Sulfa Antibiotics Other (See Comments)    unknown    Medications   Medications Prior to Admission  Medication Sig Dispense Refill Last Dose  . albuterol (PROVENTIL HFA;VENTOLIN HFA) 108 (90 BASE) MCG/ACT inhaler Inhale 2 puffs into the lungs every 6 (six) hours as needed for wheezing.   Past Month at unknown  . diphenoxylate-atropine (LOMOTIL) 2.5-0.025 MG tablet Take 1 tablet by mouth 4 (four) times daily as needed.     . indomethacin (INDOCIN) 50 MG capsule Take 50 mg by mouth 3 (three) times daily as needed.     . meclizine (ANTIVERT) 25 MG tablet Take 25 mg by mouth 3 (three) times daily as needed for dizziness.     .  nitroGLYCERIN (NITROSTAT) 0.4 MG SL tablet PLACE 1 TABLET UNDER TONGUE EVERY 5 MINS, UP TO 3 DOSES AS NEEDED FOR CHEST PAIN 25 tablet 5   . allopurinol (ZYLOPRIM) 300 MG tablet Take 300 mg by mouth daily.   01/04/2021 at 1020  . aspirin 81 MG tablet Take 81 mg by mouth daily.   01/04/2021 at 1020  . atorvastatin (LIPITOR) 80 MG tablet Take 1 tablet (80 mg total) by mouth daily. 90 tablet 3 01/04/2021 at 1020  . budesonide-formoterol (SYMBICORT) 160-4.5 MCG/ACT inhaler Inhale 2 puffs into the lungs 2 (two) times daily.   01/04/2021 at 1020  . buPROPion (WELLBUTRIN XL) 300 MG 24 hr tablet Take 300 mg by mouth daily. (Patient not taking: No sig reported)   Not Taking at Unknown time  . calcium elemental as carbonate (BARIATRIC TUMS ULTRA) 400 MG chewable tablet Chew by mouth.   01/04/2021  . CALCIUM PO Take 1,000 mg by mouth daily.     . calcium-vitamin D (OSCAL WITH D) 500-200 MG-UNIT per tablet Take 1 tablet by mouth daily. 500 mg by mouth daily (Patient not taking: No sig reported)   Not Taking at Unknown time  . cholecalciferol (VITAMIN D) 400 UNITS TABS tablet Take 1,000 Units by mouth daily.   01/04/2021  . Cholecalciferol (VITAMIN D3) 10 MCG (400 UNIT) tablet Take by mouth.   01/04/2021  . clonazePAM (KLONOPIN) 1 MG tablet Take 1 mg by mouth daily as needed for anxiety.  (Patient not taking: No sig reported)   Not Taking at Unknown time  . doxycycline (VIBRA-TABS) 100 MG tablet Take 100 mg by mouth 2 (two) times daily. (Patient not taking: No sig reported)   Completed Course at Unknown time  . DULoxetine (CYMBALTA) 60 MG capsule Take 1 capsule (60 mg total) by mouth daily. (Patient not taking: No sig reported)   Not Taking at Unknown time  .  esomeprazole (NEXIUM) 40 MG capsule Take 40 mg by mouth in the morning and at bedtime.   01/04/2021  . EUCRISA 2 % OINT Apply 1 application topically daily as needed.    01/04/2021  . famotidine (PEPCID) 40 MG tablet Take 40 mg by mouth daily.   01/04/2021  . furosemide (LASIX)  20 MG tablet Take by mouth.   01/04/2021  . isosorbide mononitrate (IMDUR) 60 MG 24 hr tablet Take 1 tablet (60 mg total) by mouth daily. 90 tablet 3 01/04/2021 at 1020  . metoprolol succinate (TOPROL-XL) 50 MG 24 hr tablet Take two (2) tablet (100 mg) by mouth daily. (Patient not taking: No sig reported) 180 tablet 3 Not Taking at Unknown time  . nitrofurantoin, macrocrystal-monohydrate, (MACROBID) 100 MG capsule Take 1 capsule (100 mg total) by mouth 2 (two) times daily. (Patient not taking: No sig reported) 10 capsule 0 Completed Course at Unknown time  . OLANZapine (ZYPREXA) 2.5 MG tablet Take 2.5 mg by mouth at bedtime.   01/04/2021  . pantoprazole (PROTONIX) 40 MG tablet Take 1 tablet (40 mg total) by mouth 2 (two) times daily. (Patient not taking: No sig reported) 60 tablet 1 Not Taking at Unknown time  . QUEtiapine (SEROQUEL) 50 MG tablet Take 50 mg by mouth at bedtime. (Patient not taking: No sig reported)   Not Taking at Unknown time  . Tiotropium Bromide Monohydrate (SPIRIVA RESPIMAT) 2.5 MCG/ACT AERS Inhale 2 puffs into the lungs daily.   01/04/2021  . tiZANidine (ZANAFLEX) 4 MG tablet Take 4 mg by mouth 3 (three) times daily as needed for muscle spasms.  (Patient not taking: No sig reported)   Not Taking at Unknown time  . vitamin B-12 (CYANOCOBALAMIN) 1000 MCG tablet Take 1,000 mcg by mouth in the morning and at bedtime.   01/04/2021     Vitals   Vitals:   01/08/21 0826 01/08/21 1035 01/08/21 1159 01/08/21 1225  BP: (!) 151/71 (!) 134/53 (!) 143/53 (!) 129/40  Pulse: 71 98 (!) 102 79  Resp: 18 18 (!) 30 12  Temp: 98.5 F (36.9 C)  98.4 F (36.9 C) 97.9 F (36.6 C)  TempSrc:      SpO2: 100% 100% 100% 98%  Weight:      Height:         Body mass index is 28.5 kg/m.  Physical Exam   General: Laying comfortably in bed; in no acute distress.  HENT: Normal oropharynx and mucosa. Normal external appearance of ears and nose.  Neck: Supple, no pain or tenderness  CV: No JVD. No  peripheral edema.  Pulmonary: Symmetric Chest rise. Normal respiratory effort.  Abdomen: Soft to touch, non-tender.  Ext: No cyanosis, edema, or deformity  Skin: No rash. Normal palpation of skin.   Musculoskeletal: Normal digits and nails by inspection. No clubbing.  Neurologic Examination  Mental status/Cognition: Alert, oriented to self, place, month and year, good attention.  Speech/language: Fluent, comprehension intact, object naming intact, repetition intact.  Cranial nerves:   CN II Pupils equal and reactive to light, no VF deficits    CN III,IV,VI EOM intact, no gaze preference or deviation, no nystagmus    CN V normal sensation in V1, V2, and V3 segments bilaterally    CN VII no asymmetry, no nasolabial fold flattening    CN VIII normal hearing to speech    CN IX & X normal palatal elevation, no uvular deviation    CN XI 5/5 head turn and 5/5 shoulder shrug  bilaterally    CN XII midline tongue protrusion    Motor:  Muscle bulk: normal , tone normal, pronator drift none Mvmt Root Nerve  Muscle Right Left Comments  SA C5/6 Ax Deltoid 5 5   EF C5/6 Mc Biceps 5 5   EE C6/7/8 Rad Triceps 5 5   WF C6/7 Med FCR     WE C7/8 PIN ECU     F Ab C8/T1 U ADM/FDI 5 5   HF L1/2/3 Fem Illopsoas 5 5   KE L2/3/4 Fem Quad 5 5   DF L4/5 D Peron Tib Ant 5 5   PF S1/2 Tibial Grc/Sol 5 5    Reflexes:  Right Left Comments  Pectoralis      Biceps (C5/6) 3 3   Brachioradialis (C5/6) 3 3    Triceps (C6/7) 3 3    Patellar (L3/4) 4 4 Cross adductors + with spread.   Achilles (S1) 4 4 2-3 beats of clonus   Hoffman      Plantar     Jaw jerk    Sensation:  Light touch    Pin prick    Temperature    Vibration   Proprioception    Coordination/Complex Motor:  - Finger to Nose intact BL - Heel to shin intact BL - Rapid alternating movement are slowed in BL uppers. - Gait: Deferred.  Labs   CBC:  Recent Labs  Lab 01/07/21 1213 01/08/21 0527  WBC 5.7 5.1  NEUTROABS 2.3  --    HGB 11.8* 10.8*  HCT 34.2* 30.7*  MCV 99.1 99.4  PLT 218 195    Basic Metabolic Panel:  Lab Results  Component Value Date   NA 141 01/08/2021   K 5.0 01/08/2021   CO2 21 (L) 01/08/2021   GLUCOSE 109 (H) 01/08/2021   BUN 30 (H) 01/08/2021   CREATININE 1.28 (H) 01/08/2021   CALCIUM 8.9 01/08/2021   GFRNONAA 45 (L) 01/08/2021   GFRAA 37 (L) 07/03/2020   Lipid Panel:  Lab Results  Component Value Date   LDLCALC 80 07/01/2020   HgbA1c:  Lab Results  Component Value Date   HGBA1C 6.7 (H) 07/01/2020   Urine Drug Screen:     Component Value Date/Time   LABOPIA NONE DETECTED 01/07/2021 0920   LABOPIA POSITIVE (A) 12/09/2012 1621   COCAINSCRNUR NONE DETECTED 01/07/2021 0920   LABBENZ POSITIVE (A) 01/07/2021 0920   LABBENZ POSITIVE (A) 12/09/2012 1621   AMPHETMU NONE DETECTED 01/07/2021 0920   AMPHETMU NONE DETECTED 12/09/2012 1621   THCU NONE DETECTED 01/07/2021 0920   THCU NONE DETECTED 12/09/2012 1621   LABBARB NONE DETECTED 01/07/2021 0920   LABBARB NONE DETECTED 12/09/2012 1621    Alcohol Level     Component Value Date/Time   ETH <10 01/07/2021 1213    CT Head without contrast: CTH was negative for a large hypodensity concerning for a large territory infarct or hyperdensity concerning for an ICH  MRI Brain  1.   Minimal generalized cortical atrophy, typical for age. 2.   Some scattered T2/FLAIR hyperintense foci consistent with mild chronic microvascular ischemic change, typical for age. 3.   No acute findings.  Normal enhancement pattern.  MRI C spine: 1.   Subtle T2 hyperintense signal within the spinal cord adjacent to the point of maximum stenosis at C3-C4.  This is consistent with cervical myelopathy. 2.   At C2-C3, there is mild spinal stenosis due to degenerative changes but no nerve root compression. 3.  At C3-C4, there is severe spinal stenosis with an AP diameter of 5.9 mm due to retrolisthesis and other degenerative changes.  There is moderately  severe left left foraminal narrowing with some potential for left C4 nerve root compression. 4.   At C4-C5, there is moderate spinal stenosis due to degenerative changes.  There did not appear to be any nerve root compression. 5.   At C5-C6, there is moderately severe spinal stenosis with an AP diameter of 6.0 mm due to retrolisthesis and other degenerative changes.  There is moderate foraminal narrowing but there does not appear to be nerve root compression. 6.   At C6-C7, there is moderate spinal stenosis but no nerve root compression. 7.   At C7-T1, there is mild spinal stenosis due to anterolisthesis and other degenerative changes but no nerve root compression.  rEEG: pending.  Impression   Crystal [name] is a 70 y.o. female with PMH significant for anxiety, bipolar, ischemic heart disease, COPD, CAD, depression, diabetes, GERD, gout, hypercholesterolemia, hypertension peripheral arterial disease who presents with an episode concerning for seizure. The video of the event that I saw with arhythmic arm and leg jerking is less concerning for a clinical seizure. Klonopin withdrawal can potnetially cause seizures but she has been off Klonopin for 7 days and well outside the withdrawal window.  She has had several of these in the last couple of days, we do not have the ability to do a cEEG but will get a routine EEG to evaluate for any epileptiform abnormality. My suspicion for these events being epileptic are low based on the video of the event, minimal post ictal period.  Recommendations  - Hold off on AEDs - Hold off on Klonopin. This was stopped by Psych at [facility] and thought to be contributing to her confusion. - Okay to resume other home medications - will get a rEEG to evaluate for any epileptogenic abnormalities. - Recommend outpatient Psychotherapy for PNES. ______________________________________________________________________   Thank you for the opportunity to take part in the care  of this patient. If you have any further questions, please contact the neurology consultation attending.  Signed,  Erick Blinks [facility] Pager Number [phone] _ _ _   _ __   _ __ _ _  __ __   _ __   __ _

## 2021-01-08 NOTE — Progress Notes (Signed)
Initial Nutrition Assessment  DOCUMENTATION CODES:   Not applicable  INTERVENTION:   -Ensure Enlive po BID, each supplement provides 350 kcal and 20 grams of protein -MVI with minerals daily  NUTRITION DIAGNOSIS:   Increased nutrient needs related to chronic illness (HLD) as evidenced by estimated needs.  GOAL:   Patient will meet greater than or equal to 90% of their needs  MONITOR:   PO intake,Supplement acceptance,Labs,Weight trends,Skin,I & O's  REASON FOR ASSESSMENT:   Malnutrition Screening Tool    ASSESSMENT:   70 y/o F w/ PMH of HTN, HLD, GERD, anxiety, bipolar, schizophrenia who presented w/ seizure like activity x morning of admission.  Pt admitted the possible seizure vs pseudoseizure.   Reviewed I/O's: +200 ml x 24 hours   UOP: 800 ml x 24 hours  Pt unavailable at time of visit. Unable to obtain further nutriton-related history or complete nutrition-focused physical exam at this time.   No meal completion records available to assess at this time.   Reviewed wt hx; no wt loss over the past month, however, pt with a distant history of mild wt loss.   Medications reviewed and include keppra and 0.9% sodium chloride infusion @ 75 ml/hr.  Labs reviewed: CBGS: 101.  Diet Order:   Diet Order            Diet 2 gram sodium Room service appropriate? Yes; Fluid consistency: Thin  Diet effective now                 EDUCATION NEEDS:   No education needs have been identified at this time  Skin:  Skin Assessment: Reviewed RN Assessment  Last BM:  01/06/21  Height:   Ht Readings from Last 1 Encounters:  01/07/21 5' (1.524 m)    Weight:   Wt Readings from Last 1 Encounters:  01/07/21 66.2 kg    Ideal Body Weight:  45.5 kg  BMI:  Body mass index is 28.5 kg/m.  Estimated Nutritional Needs:   Kcal:  1800-2000  Protein:  100-115 grams  Fluid:  > 1.8 L    Levada Schilling, RD, LDN, CDCES Registered Dietitian II Certified Diabetes Care and  Education Specialist Please refer to [facility] for RD and/or RD on-call/weekend/after hours pager

## 2021-01-08 NOTE — Significant Event (Signed)
Rapid Response Event Note   Reason for Call :   Seizure Initial Focused Assessment:   Per bedside RN- patient having her 3rd seizure since the beginning of the shift this am.      Interventions:  Dr. Mayford Knife ordered ativan and patient resting comfortably at this time. Vitals stable.  Plan of Care:  Dr. Mayford Knife stated that she has reached out to neurology and the plan is for an EEG.   Event Summary:   MD Notified: Dr. Mayford Knife Call Time:12:19 Arrival Time:12:21 End Time:12:26  Williemae Natter, RN

## 2021-01-08 NOTE — Procedures (Signed)
ROUTINE EEG REPORT REFERRING PHYSICIAN:  Erick Blinks MD DATE OF STUDY:  01/08/2021  HISTORY: 70yo woman with episodes of prolonged asymmetric jerking, evaluate for epileptiform abnormalities.  DESCRIPTION: During maximal wakefulness, the background was well organized and continuous, consisting of an admixture of fast frequencies (alpha, beta and theta) ranging between 10-50 uV.  There was a posterior dominant alpha rhythm at 8-9 Hz, which was symmetric and reactive to eye opening and closure. Low amplitude, 13-18 Hz beta rhythms were seen symmetrically over the fronto-central head regions. Spontaneous variability and reactivity to stimulation were present.  Stage I (N1) sleep was recorded, with alpha dropout, slow roving eye movements, high-voltage centrally predominant vertex waves, and positive occipital sharp transients of sleep (POSTS). Stage II (N2) sleep was recorded, with bilateral and symmetric K complexes, and abundant sleep spindles at 12-16 Hz. REM sleep was seen on the recording.  The vast majority of the recording consisted of stage II sleep. No persistent focal asymmetries or abnormalities were seen. No epileptiform discharges were recorded. No clinical or electrographic seizures were recorded.      CLASSIFICATION (EEG IMPRESSION): Normal (awake, asleep)  CLINICAL INTERPRETATION: The majority of the recording consisted of normal sleep.  There was no evidence to support a diagnosis of seizures on this recording.  A normal EEG does not exclude a diagnosis of epilepsy and should concern persist for convulsive events elective Espin term video EEG monitoring would be recommended.  Lonisha Bobby A. Senaida Ores, MD Neurology and Clinical Neurophysiology

## 2021-01-08 NOTE — Evaluation (Signed)
Occupational Therapy Evaluation Patient Details Name: Crystal [name] MRN: [id number] DOB: 1951/06/04 Today's Date: 01/08/2021    History of Present Illness Crystal [name] is a 70 y/o F w/ PMH of HTN, HLD, GERD, anxiety, bipolar, schizophrenia who presented w/ seizure like activity yhe morning of admission. Pt evidently had 2 episodes of shaking over her entire body that lasted about 25-30 mins. Pt was awake during these episodes but was unable to talk.   Clinical Impression   Crystal [name] presents today with generalized weakness and reduced endurance. She lives alone in a 1-story home, with her children (son, daughter, daughter-in-law) visiting frequently. She reports she has difficulty maneuvering up and down the 4 steps leading into her home (she stands sideways, facing the handrail, and moves one step at a time), feels unsteady in the shower and would like to get a shower chair, and has had "at least" 10 falls in the past year. She has been walking with a SPC but would like to switch to a RW. She is alert and generally oriented to time & place, but does appear slightly confused and has occasional word-finding difficulty. She denies pain. Pt reports that both her vision and hearing feel "off," and she has an eye appt and a hearing test scheduled for next month. Pt is IND in ADL and most IADL: she drives, does her own shopping, cooking, and cleaning. She admits, however, that she is unable to manage her medications -- that she cannot keep track of what to take or when to take them. Her children have recently gotten involved in pt's medication management: picking up her Rxs, putting pills into boxes labeled by time and date, and checking in with her each day to make sure that she has taken them correctly. Crystal [name] is able to perform functional mobility tasks without physical assistance, but requires SUPV/CGA for safety, given unsteadiness in dynamic sitting and standing. During OT session, pt's left hand, then  entire L UE, began to shake. Within 1 minute, pt experienced a full seizure, lasting approximately 5 minutes. Therapist called RN as soon as seizure activity began, and RN immediately came to room. Pt reassured, therapist arranged pillows in bed to provide safety barriers for pt. Pt unable to communicate during seizure activity, but as it subsided she was able to speak with nurse and therapist.    Follow Up Recommendations  Home health OT    Equipment Recommendations  Tub/shower seat;Other (comment) (RW)    Recommendations for Other Services       Precautions / Restrictions Precautions Precautions: Fall Precaution Comments: seizure precautions Restrictions Weight Bearing Restrictions: No      Mobility Bed Mobility Overal bed mobility: Needs Assistance Bed Mobility: Supine to Sit;Sit to Supine     Supine to sit: Supervision Sit to supine: Supervision        Transfers Overall transfer level: Needs assistance   Transfers: Sit to/from Stand Sit to Stand: Min guard              Balance Overall balance assessment: Needs assistance Sitting-balance support: No upper extremity supported;Feet supported Sitting balance-Leahy Scale: Good     Standing balance support: No upper extremity supported Standing balance-Leahy Scale: Fair                 High Level Balance Comments: reports "at least" 10 falls in the past year           ADL either performed or assessed with clinical judgement  ADL Overall ADL's : Needs assistance/impaired Eating/Feeding: Independent   Grooming: Sitting;Wash/dry hands;Wash/dry face;Supervision/safety                                       Vision Baseline Vision/History: Wears glasses Wears Glasses: At all times Patient Visual Report: No change from baseline Additional Comments: reports she is due for an eye exam, she feels that her vision has deteriorated within the past year     Perception     Praxis       Pertinent Vitals/Pain Pain Assessment: No/denies pain     Hand Dominance Right   Extremity/Trunk Assessment Upper Extremity Assessment Upper Extremity Assessment: Generalized weakness   Lower Extremity Assessment Lower Extremity Assessment: Generalized weakness       Communication Communication Communication: No difficulties   Cognition Arousal/Alertness: Awake/alert Behavior During Therapy: Anxious Overall Cognitive Status: No family/caregiver present to determine baseline cognitive functioning                                     General Comments  Session terminated when pt had a seizure, RN notified immediately    Exercises Other Exercises Other Exercises: Educ re: medication mgmt; DME; falls prevention   Shoulder Instructions      Home Living Family/patient expects to be discharged to:: Private residence Living Arrangements: Alone Available Help at Discharge: Family;Available PRN/intermittently Type of Home: House Home Access: Stairs to enter [employer] of Steps: 4 Entrance Stairs-Rails: Right;Left Home Layout: One level     Bathroom Shower/Tub: Chief Strategy Officer: Handicapped height     Home Equipment: Cane - single point   Additional Comments: Pt reports she needs a shower chair and a RW      Prior Functioning/Environment Level of Independence: Needs assistance  Gait / Transfers Assistance Needed: Pt has difficulty getting up/down 4 stairs into her house ADL's / Homemaking Assistance Needed: pt's children assist with medication management Communication / Swallowing Assistance Needed: some word-finding difficulty          OT Problem List: Decreased strength;Decreased activity tolerance;Impaired balance (sitting and/or standing);Decreased cognition;Decreased coordination;Decreased knowledge of use of DME or AE      OT Treatment/Interventions: Self-care/ADL training;Patient/family education;Therapeutic  exercise;Balance training;Therapeutic activities;DME and/or AE instruction    OT Goals(Current goals can be found in the care plan section) Acute Rehab OT Goals Patient Stated Goal: to go home OT Goal Formulation: With patient Time For Goal Achievement: 01/22/21 Potential to Achieve Goals: Good ADL Goals Pt Will Perform Lower Body Dressing: Independently;sit to/from stand Pt Will Transfer to Toilet: Independently (using LRAD, with no LOB) Additional ADL Goal #1: Pt will identify medication management system  OT Frequency: Min 1X/week   Barriers to D/C:            Co-evaluation              AM-PAC OT "6 Clicks" Daily Activity     Outcome Measure Help from another person eating meals?: None Help from another person taking care of personal grooming?: A Little Help from another person toileting, which includes using toliet, bedpan, or urinal?: A Little Help from another person bathing (including washing, rinsing, drying)?: A Little Help from another person to put on and taking off regular upper body clothing?: A Little Help from another person to put on  and taking off regular lower body clothing?: A Little 6 Click Score: 19   End of Session    Activity Tolerance: Patient tolerated treatment well;Treatment limited secondary to medical complications (Comment) (Seizure) Patient left: in bed;with nursing/sitter in room  OT Visit Diagnosis: Unsteadiness on feet (R26.81);History of falling (Z91.81);Muscle weakness (generalized) (M62.81)                Time: 1015-1040 OT Time Calculation (min): 25 min Charges:  OT General Charges $OT Visit: 1 Visit OT Evaluation $OT Eval Moderate Complexity: 1 Mod OT Treatments $Self Care/Home Management : 23-37 mins  Latina Craver, PhD, MS, OTR/L 01/08/21, 1:37 PM

## 2021-01-09 DIAGNOSIS — N179 Acute kidney failure, unspecified: Secondary | ICD-10-CM | POA: Diagnosis not present

## 2021-01-09 DIAGNOSIS — R569 Unspecified convulsions: Secondary | ICD-10-CM | POA: Diagnosis not present

## 2021-01-09 DIAGNOSIS — F445 Conversion disorder with seizures or convulsions: Secondary | ICD-10-CM | POA: Diagnosis not present

## 2021-01-09 NOTE — TOC Transition Note (Addendum)
Transition of Care [facility]) - CM/SW Discharge Note   Patient Details  Name: Crystal [name] MRN: [id number] Date of Birth: [birth year]  [facility] CM/SW Contact:  Margarito Liner, LCSW Phone Number: 01/09/2021, 1:44 PM   Clinical Narrative: Patient has orders to discharge home today. This CSW working remote today. Called patient in room, introduced role, and explained that PT recommendations would be discussed. Patient stated she is supposed to be going to [facility] in [facility] where she will get her PCP services, outpatient PT, etc. She reports no DME needs. Has a walker at home. No further concerns. Her granddaughter is at bedside and will take her home. CSW signing off.    5:47 pm: Son wanted to speak with CSW regarding concerns about her going home. Discussed that 24/7 caregivers are a private pay service. DIscussed potential for LTC placement in SNF vs. ALF using her Medicaid. He's not sure he's ready for LTC placement yet. Discussed that PT is only recommending HH, not SNF at this time. He is agreeable to home health. [facility], and [facility] are unable to accept referral. [facility] and [facility] reviewing. [facility]ore will review tomorrow. Left messages for [facility] and [facility].  Final next level of care: Home/Self Care Barriers to Discharge: No Barriers Identified   Patient Goals and CMS Choice        Discharge Placement                Patient to be transferred to facility by: Granddaughter will take her home   Patient and family notified of of transfer: 01/09/21  Discharge Plan and Services                                     Social Determinants of Health (SDOH) Interventions     Readmission Risk Interventions No flowsheet data found.

## 2021-01-09 NOTE — Plan of Care (Signed)
End of Shift Summary:  Alert and oriented x3, reoriented as needed. Remained on room air, sats >96%. Denies pain or n/v. No seizure activity noted during this shift. IVF maintained. Remained free from fall or injury. Urine output adequate. Bed low and in locked position. Call bell within reach and able to use.   Problem: Education: Goal: Expressions of having a comfortable level of knowledge regarding the disease process will increase Outcome: Progressing   Problem: Coping: Goal: Ability to adjust to condition or change in health will improve Outcome: Progressing Goal: Ability to identify appropriate support needs will improve Outcome: Progressing   Problem: Health Behavior/Discharge Planning: Goal: Compliance with prescribed medication regimen will improve Outcome: Progressing   Problem: Medication: Goal: Risk for medication side effects will decrease Outcome: Progressing   Problem: Safety: Goal: Verbalization of understanding the information provided will improve Outcome: Progressing   Problem: Education: Goal: Knowledge of General Education information will improve Description: Including pain rating scale, medication(s)/side effects and non-pharmacologic comfort measures Outcome: Progressing   Problem: Health Behavior/Discharge Planning: Goal: Ability to manage health-related needs will improve Outcome: Progressing   Problem: Clinical Measurements: Goal: Ability to maintain clinical measurements within normal limits will improve Outcome: Progressing Goal: Will remain free from infection Outcome: Progressing Goal: Diagnostic test results will improve Outcome: Progressing Goal: Respiratory complications will improve Outcome: Progressing Goal: Cardiovascular complication will be avoided Outcome: Progressing   Problem: Activity: Goal: Risk for activity intolerance will decrease Outcome: Progressing   Problem: Nutrition: Goal: Adequate nutrition will be  maintained Outcome: Progressing   Problem: Coping: Goal: Level of anxiety will decrease Outcome: Progressing   Problem: Pain Managment: Goal: General experience of comfort will improve Outcome: Progressing   Problem: Safety: Goal: Ability to remain free from injury will improve Outcome: Progressing   Problem: Skin Integrity: Goal: Risk for impaired skin integrity will decrease Outcome: Progressing

## 2021-01-09 NOTE — Progress Notes (Signed)
Physical Therapy Treatment Patient Details Name: ASHIA DEHNER MRN: [id number] DOB: [birth year] Today's Date: 01/09/2021    History of Present Illness Crystal [name] is a 70 y/o F w/ PMH of HTN, HLD, GERD, anxiety, bipolar, schizophrenia who presented w/ seizure like activity yhe morning of admission. Pt evidently had 2 episodes of shaking over her entire body that lasted about 25-30 mins. Pt was awake during these episodes but was unable to talk.    PT Comments    Pt was able to get up to standing and circumambulate the nurses' station without direct physical assist.  She reports she has a walker and a cane at home and as she did have a few stagger steps during ambulation PT suggested that she use an AD at least initially until she has had some time to work with PT at home.     Follow Up Recommendations  Home health PT     Equipment Recommendations  None recommended by PT    Recommendations for Other Services       Precautions / Restrictions Precautions Precautions: Fall Precaution Comments: seizure precautions Restrictions Weight Bearing Restrictions: No    Mobility  Bed Mobility Overal bed mobility: Modified Independent                  Transfers Overall transfer level: Modified independent Equipment used: Rolling walker (2 wheeled) Transfers: Sit to/from Stand Sit to Stand: Supervision         General transfer comment: able to rise to standing safely and w/o assist  Ambulation/Gait Ambulation/Gait assistance: Min guard Gait Distance (Feet): 200 Feet Assistive device: Rolling walker (2 wheeled);None       General Gait Details: ~100 ft with walker ~ 100 ft with faded HHA.  She did have a few stagger steps that she was able to self arrest but no overt LOBs.  Pt's O2 and HR remained stable and appropriate t/o the effort   Stairs             Wheelchair Mobility    Modified Rankin (Stroke Patients Only)       Balance Overall balance assessment:  Modified Independent                                          Cognition Arousal/Alertness: Awake/alert Behavior During Therapy: WFL for tasks assessed/performed Overall Cognitive Status: No family/caregiver present to determine baseline cognitive functioning                                        Exercises      General Comments General comments (skin integrity, edema, etc.): Pt did well with mobility, family present and reports that ambulation did not look too far off from her normal      Pertinent Vitals/Pain Pain Assessment: No/denies pain    Home Living                      Prior Function            PT Goals (current goals can now be found in the care plan section) Progress towards PT goals: Progressing toward goals    Frequency    Min 2X/week      PT Plan Current plan remains appropriate    Co-evaluation  AM-PAC PT "6 Clicks" Mobility   Outcome Measure  Help needed turning from your back to your side while in a flat bed without using bedrails?: None Help needed moving from lying on your back to sitting on the side of a flat bed without using bedrails?: None Help needed moving to and from a bed to a chair (including a wheelchair)?: None Help needed standing up from a chair using your arms (e.g., wheelchair or bedside chair)?: None Help needed to walk in hospital room?: None Help needed climbing 3-5 steps with a railing? : None 6 Click Score: 24    End of Session Equipment Utilized During Treatment: Gait belt Activity Tolerance: Patient tolerated treatment well Patient left: in bed;with call bell/phone within reach;with nursing/sitter in room Nurse Communication: Mobility status PT Visit Diagnosis: Difficulty in walking, not elsewhere classified (R26.2);Muscle weakness (generalized) (M62.81);Unsteadiness on feet (R26.81);Other symptoms and signs involving the nervous system (Z99.357)     Time:  0177-9390 PT Time Calculation (min) (ACUTE ONLY): 16 min  Charges:  $Gait Training: 8-22 mins                     Malachi Pro, DPT 01/09/2021, 5:28 PM

## 2021-01-09 NOTE — Discharge Summary (Signed)
Physician Discharge Summary  Crystal [name] VWU:[id number] DOB: 28-Nov-1950 DOA: 01/07/2021  PCP: Lonie Peak, PA-C  Admit date: 01/07/2021 Discharge date: 01/09/2021  Time spent: 45 minutes  Recommendations for Outpatient Follow-up:  1. Needs outpatient neurosurgery eval within 2 weeks with regards to C4-C5 and C2-C3 myelopathy (Dr. Marcell Barlow of neurosurgery aware) 2. Will need outpatient psychotherapy and psychiatric management 3. Recommend labs in about 1 week  Discharge Diagnoses:  MAIN problem for hospitalization   Pseudoseizures and myelopathy  Please see below for itemized issues addressed in HOpsital- refer to other progress notes for clarity if needed  Discharge Condition: Improved  Diet recommendation: Regular  Filed Weights   01/07/21 1209 01/07/21 1700  Weight: 17.5 kg 66.2 kg    History of present illness:  70 year old white female known history of bipolar with predominant anxiety CAD HTN COPD DM TY 2 Reflux with hypercholesterolemia  Recent Rx [facility]-seen by psychiatry for SI psychosis with aggression-switched from Zyprexa to Seroquel, Klonopin discontinued-Wellbutrin and Cymbalta also discontinued She was seen at the [facility] ED subsequently for seizure-like activity?  2/2 Klonopin withdrawal  Family history reports on 4/11 had 20 to 25-minute Merlino episode bilateral arm jerking arrhythmic arrhythmic leg jerking-patient can feel the seizure coming on and is aware throughout the period starts with right hand shaking and quickly spreads to the rest of the body  Rx Ativan Keppra on arrival to ED CT head without contrast - abnormality, MRI brain recently 3/29 normal MRI neck showed C2-C3 cervical myelopathy  Neurology consulted felt based on presentation and review of the video less concerning for clinical seizure Routine EEG i 4/12 = no evidence to support seizures  Neurology signed off and felt the patient could follow-up with their psychotherapist as sees  episodes were clearly pseudoseizures in origin-I had a Barse discussion at the bedside with the patient's niece and neurologist had spoken in detail with both sons on 4/12 explaining the origin and etiology of her shaking spells  I did reach out to Dr. Marcell Barlow of neurosurgery who will evaluate the patient before the patient leaves to go home  PT evaluated the patient briefly but felt home health PT may be appropriate   Consultations:  Neurology Dr. Terrilee Files  Neurosurgery Dr. Marcell Barlow  Discharge Exam: Vitals:   01/09/21 0455 01/09/21 0900  BP: (!) 141/51 132/63  Pulse: 72 78  Resp: 16 18  Temp: 98.7 F (37.1 C) 98.7 F (37.1 C)  SpO2: 98% 98%    Subj on day of d/c   Had an episode of shaking spells lasting about 2 minutes but patient was oriented throughout and was nonpostictal and coherent immediately after She is eating and drinking when I went back into the room to talk to her subsequently did not appear to have any issues  General Exam on discharge  EOMI NCAT no focal deficit set Finger-nose-finger intact Chest clear no added sound no rales no rhonchi Abdomen soft no rebound no guarding Power 5/5 Reflexes somewhat brisk No lower extremity edema  Discharge Instructions   Discharge Instructions    Diet - low sodium heart healthy   Complete by: As directed    Discharge instructions   Complete by: As directed    You have episodes that are not epileptic-it has been recommended by the neurologist who saw you and did various tests including the MRI CT and electroencephalogram of your brain that you need rarely to follow-up with your psychotherapist  sometimes the connection between the mind and body gets  cross wired in a funny way and you have episodes that we cannot completely explain-these are called pseudoseizures-we think that with good psychotherapy and with stress management etc. you should be able to control these we will ask for therapy services to come out to  your home to help manage you-as I discussed with your relative at the bedside, please make sure that you contact your psychologist/psychiatrist at the earliest to ensure that we have a good plan in place for you I would recommend also that you follow-up with your primary care physician within the next 2 weeks or so   Increase activity slowly   Complete by: As directed      Allergies as of 01/09/2021      Reactions   Bactrim [sulfamethoxazole-trimethoprim] Nausea And Vomiting   Penicillins Other (See Comments)   Unknown   Sulfa Antibiotics Other (See Comments)   unknown      Medication List    STOP taking these medications   buPROPion 300 MG 24 hr tablet Commonly known as: WELLBUTRIN XL   calcium elemental as carbonate 400 MG chewable tablet Commonly known as: BARIATRIC TUMS ULTRA   CALCIUM PO   calcium-vitamin D 500-200 MG-UNIT tablet Commonly known as: OSCAL WITH D   cholecalciferol 10 MCG (400 UNIT) Tabs tablet Commonly known as: VITAMIN D3   clonazePAM 1 MG tablet Commonly known as: KLONOPIN   diphenoxylate-atropine 2.5-0.025 MG tablet Commonly known as: LOMOTIL   doxycycline 100 MG tablet Commonly known as: VIBRA-TABS   DULoxetine 60 MG capsule Commonly known as: CYMBALTA   esomeprazole 40 MG capsule Commonly known as: NEXIUM   famotidine 40 MG tablet Commonly known as: PEPCID   furosemide 20 MG tablet Commonly known as: LASIX   metoprolol succinate 50 MG 24 hr tablet Commonly known as: TOPROL-XL   nitrofurantoin (macrocrystal-monohydrate) 100 MG capsule Commonly known as: Macrobid   nitroGLYCERIN 0.4 MG SL tablet Commonly known as: NITROSTAT   pantoprazole 40 MG tablet Commonly known as: PROTONIX   QUEtiapine 50 MG tablet Commonly known as: SEROQUEL   tiZANidine 4 MG tablet Commonly known as: ZANAFLEX   Vitamin D3 10 MCG (400 UNIT) tablet     TAKE these medications   albuterol 108 (90 Base) MCG/ACT inhaler Commonly known as: VENTOLIN  HFA Inhale 2 puffs into the lungs every 6 (six) hours as needed for wheezing.   allopurinol 300 MG tablet Commonly known as: ZYLOPRIM Take 300 mg by mouth daily.   aspirin 81 MG tablet Take 81 mg by mouth daily.   atorvastatin 80 MG tablet Commonly known as: LIPITOR Take 1 tablet (80 mg total) by mouth daily.   budesonide-formoterol 160-4.5 MCG/ACT inhaler Commonly known as: SYMBICORT Inhale 2 puffs into the lungs 2 (two) times daily.   Eucrisa 2 % Oint Generic drug: Crisaborole Apply 1 application topically daily as needed.   indomethacin 50 MG capsule Commonly known as: INDOCIN Take 50 mg by mouth 3 (three) times daily as needed.   isosorbide mononitrate 60 MG 24 hr tablet Commonly known as: IMDUR Take 1 tablet (60 mg total) by mouth daily.   meclizine 25 MG tablet Commonly known as: ANTIVERT Take 25 mg by mouth 3 (three) times daily as needed for dizziness.   OLANZapine 2.5 MG tablet Commonly known as: ZYPREXA Take 2.5 mg by mouth at bedtime.   Spiriva Respimat 2.5 MCG/ACT Aers Generic drug: Tiotropium Bromide Monohydrate Inhale 2 puffs into the lungs daily.   vitamin B-12 1000 MCG tablet Commonly  known as: CYANOCOBALAMIN Take 1,000 mcg by mouth in the morning and at bedtime.      Allergies  Allergen Reactions  . Bactrim [Sulfamethoxazole-Trimethoprim] Nausea And Vomiting  . Penicillins Other (See Comments)    Unknown   . Sulfa Antibiotics Other (See Comments)    unknown      The results of significant diagnostics from this hospitalization (including imaging, microbiology, ancillary and laboratory) are listed below for reference.    Significant Diagnostic Studies: CT Head Wo Contrast  Result Date: 01/07/2021 CLINICAL DATA:  Tremors EXAM: CT HEAD WITHOUT CONTRAST TECHNIQUE: Contiguous axial images were obtained from the base of the skull through the vertex without intravenous contrast. COMPARISON:  08/10/2020 FINDINGS: Brain: No evidence of acute  infarction, hemorrhage, hydrocephalus, extra-axial collection or mass lesion/mass effect. Vascular: No hyperdense vessel or unexpected calcification. Skull: Normal. Negative for fracture or focal lesion. Sinuses/Orbits: No acute finding. Other: None. IMPRESSION: No acute intracranial abnormality is noted. Electronically Signed   By: Alcide Clever M.D.   On: 01/07/2021 14:52   MR BRAIN W WO CONTRAST  Result Date: 12/25/2020  [facility] [address], [address] [place], [place] [address] [phone] NEUROIMAGING REPORT STUDY DATE: 12/25/2020 PATIENT NAME: Eulah Walkup Doria DOB: [birth year] MRN: [id number] EXAM: MRI Brain with and without contrast ORDERING CLINICIAN: Naomie Dean, MD CLINICAL HISTORY: 70 year old woman with ataxia, hyperreflexia, falls and memory loss COMPARISON FILMS: None TECHNIQUE:MRI of the brain with and without contrast was obtained utilizing 5 mm axial slices with T1, T2, T2 flair, SWI and diffusion weighted views.  T1 sagittal, T2 coronal and postcontrast views in the axial and coronal plane were obtained. CONTRAST: 13 ml [facility] IMAGING SITE: [facility], [address] [place]. FINDINGS: Movement artifact is noted on some of the images.  On sagittal images, the spinal cord is imaged caudally to C4 and is normal in caliber.   The contents of the posterior fossa are of normal size and position.   The pituitary gland and optic chiasm appear normal.    There is minimal generalized cortical atrophy, typical for age.  There are no abnormal extra-axial collections of fluid.  In the hemispheres, there are also scattered T2/flair hyperintense foci.  None of these appear to be acute.  They do not enhance.  The cerebellum and brainstem appears normal.   The deep gray matter appears normal.   Diffusion weighted images are normal.  Susceptibility weighted images are normal.   There have been bilateral lens replacements.  Otherwise, the the orbits appear normal.   The VIIth/VIIIth  nerve complex appears normal.  The mastoid air cells appear normal.  The paranasal sinuses appear normal.  Flow voids are identified within the major intracerebral arteries.  After the infusion of contrast material, a normal enhancement pattern is noted.   This MRI of the brain with and without contrast shows the following: 1.   Minimal generalized cortical atrophy, typical for age. 2.   Some scattered T2/FLAIR hyperintense foci consistent with mild chronic microvascular ischemic change, typical for age. 3.   No acute findings.  Normal enhancement pattern. INTERPRETING PHYSICIAN: Richard A. Epimenio Foot, MD, PhD, FAAN Certified in  Neuroimaging by American Society of Neuroimaging   MR CERVICAL SPINE WO CONTRAST  Result Date: 12/25/2020  [facility] [address], [address] [place], [place] [address] [phone] NEUROIMAGING REPORT STUDY DATE: 12/25/2020 PATIENT NAME: Glendine Swetz Matteucci DOB: Sep 19, 1951 MRN: [id number] EXAM: MRI of the cervical spine ORDERING CLINICIAN: Naomie Dean, MD CLINICAL  HISTORY: 70 year old woman with gait ataxia, falls and hyperreflexia COMPARISON FILMS: None TECHNIQUE: MRI of the cervical spine was obtained utilizing 3 mm sagittal slices from the posterior fossa down to the T3-4 level with T1, T2 and inversion recovery views. In addition 4 mm axial slices from C2-3 down to T1-2 level were included with T2 and gradient echo views. CONTRAST: None IMAGING SITE: [facility], [address], [place], [place] FINDINGS: :  On sagittal images, the spine is imaged from above the cervicomedullary junction to T2.   There is subtle increase T2 hyperintense signal within the spinal cord adjacent to the point of maximum stenosis at C3-C4 best seen on the sagittal STIR and axial gradient echo medic images.  There is 2 mm retrolisthesis of C3 upon C4 and 1 mm retrolisthesis of C5 upon C6 and 2 mm anterolisthesis of C7 upon T1.  Disc height is reduced at C3-C4 C5-C6, C6-C7.  Endplate  degenerative changes are noted at C3-C4 and C5-C6. The discs and interspaces were further evaluated on axial views from C2 to T1 as follows: C2-C3: There is mild spinal stenosis due to minimal disc bulging and mild ligamenta flava hypertrophy.  There is no significant foraminal narrowing.  No nerve root compression. C3-C4: There is moderately severe spinal stenosis (AP diameter 5.9 mm) due to retrolisthesis, disc protrusion, uncovertebral spurring, ligamenta flava hypertrophy.  There is subtle increased signal within the spinal cord.  There is moderately severe left and moderate right foraminal narrowing.  There is possible left C4 nerve root compression.Marland Kitchen. C4-C5: There is moderate spinal stenosis (AP diameter 7.8 mm) due to disc protrusion, mild uncovertebral spurring left facet hypertrophy and ligamenta flava hypertrophy.  There is mild right and moderate left foraminal narrowing.  There does not appear to be any nerve root compression. C5-C6: There is moderately severe spinal stenosis (AP diameter 6.0 mm) due to minimal retrolisthesis, disc protrusion, uncovertebral spurring, facet hypertrophy and ligamenta flava hypertrophy.  Additionally, there is moderate right greater than left foraminal narrowing.  There does not appear to be nerve root compression. C6-C7: There is moderate spinal stenosis (AP diameter 7.3 mm) due to disc protrusion, uncovertebral spurring, and facet hypertrophy.  There is mild left foraminal narrowing but no nerve root compression. C7-T1: There is mild spinal stenosis due to anterolisthesis, disc protrusion, ligamenta flava hypertrophy.  There is mild foraminal narrowing but no nerve root compression.   This MRI of the cervical spine without contrast shows the following: 1.   Subtle T2 hyperintense signal within the spinal cord adjacent to the point of maximum stenosis at C3-C4.  This is consistent with cervical myelopathy. 2.   At C2-C3, there is mild spinal stenosis due to degenerative  changes but no nerve root compression. 3.   At C3-C4, there is severe spinal stenosis with an AP diameter of 5.9 mm due to retrolisthesis and other degenerative changes.  There is moderately severe left left foraminal narrowing with some potential for left C4 nerve root compression. 4.   At C4-C5, there is moderate spinal stenosis due to degenerative changes.  There did not appear to be any nerve root compression. 5.   At C5-C6, there is moderately severe spinal stenosis with an AP diameter of 6.0 mm due to retrolisthesis and other degenerative changes.  There is moderate foraminal narrowing but there does not appear to be nerve root compression. 6.   At C6-C7, there is moderate spinal stenosis but no nerve root compression. 7.   At C7-T1, there  is mild spinal stenosis due to anterolisthesis and other degenerative changes but no nerve root compression. INTERPRETING PHYSICIAN: Richard A. Epimenio Foot, MD, PhD, FAAN Certified in  Neuroimaging by [employer]   DG Chest [place] 1 View  Result Date: 01/07/2021 CLINICAL DATA:  Recent tremors EXAM: PORTABLE CHEST 1 VIEW COMPARISON:  08/30/2020 FINDINGS: Postsurgical changes are again noted and stable. Cardiac shadow is within normal limits. The lungs are clear. Old rib fractures are noted bilaterally with healing. No acute bony abnormality is seen. IMPRESSION: No active disease. Electronically Signed   By: Alcide Clever M.D.   On: 01/07/2021 14:54    Microbiology: Recent Results (from the past 240 hour(s))  Resp Panel by RT-PCR (Flu A&B, Covid) Nasopharyngeal Swab     Status: None   Collection Time: 01/07/21  1:08 PM   Specimen: Nasopharyngeal Swab; Nasopharyngeal(NP) swabs in vial transport medium  Result Value Ref Range Status   SARS Coronavirus 2 by RT PCR NEGATIVE NEGATIVE Final    Comment: (NOTE) SARS-CoV-2 target nucleic acids are NOT DETECTED.  The SARS-CoV-2 RNA is generally detectable in upper respiratory specimens during the acute  phase of infection. The lowest concentration of SARS-CoV-2 viral copies this assay can detect is 138 copies/mL. A negative result does not preclude SARS-Cov-2 infection and should not be used as the sole basis for treatment or other patient management decisions. A negative result may occur with  improper specimen collection/handling, submission of specimen other than nasopharyngeal swab, presence of viral mutation(s) within the areas targeted by this assay, and inadequate number of viral copies(<138 copies/mL). A negative result must be combined with clinical observations, patient history, and epidemiological information. The expected result is Negative.  Fact Sheet for Patients:  BloggerCourse.com  Fact Sheet for Healthcare Providers:  SeriousBroker.it  This test is no t yet approved or cleared by the [place] FDA and  has been authorized for detection and/or diagnosis of SARS-CoV-2 by FDA under an Emergency Use Authorization (EUA). This EUA will remain  in effect (meaning this test can be used) for the duration of the COVID-19 declaration under Section 564(b)(1) of the Act, 21 U.S.C.section 360bbb-3(b)(1), unless the authorization is terminated  or revoked sooner.       Influenza A by PCR NEGATIVE NEGATIVE Final   Influenza B by PCR NEGATIVE NEGATIVE Final    Comment: (NOTE) The Xpert Xpress SARS-CoV-2/FLU/RSV plus assay is intended as an aid in the diagnosis of influenza from Nasopharyngeal swab specimens and should not be used as a sole basis for treatment. Nasal washings and aspirates are unacceptable for Xpert Xpress SARS-CoV-2/FLU/RSV testing.  Fact Sheet for Patients: BloggerCourse.com  Fact Sheet for Healthcare Providers: SeriousBroker.it  This test is not yet approved or cleared by the [place] FDA and has been authorized for detection and/or diagnosis of  SARS-CoV-2 by FDA under an Emergency Use Authorization (EUA). This EUA will remain in effect (meaning this test can be used) for the duration of the COVID-19 declaration under Section 564(b)(1) of the Act, 21 U.S.C. section 360bbb-3(b)(1), unless the authorization is terminated or revoked.  Performed at [facility], [address] [place], [place] [address]      Labs: Basic Metabolic Panel: Recent Labs  Lab 01/07/21 1249 01/08/21 0527  NA 138 141  K 4.6 5.0  CL 109 113*  CO2 22 21*  GLUCOSE 98 109*  BUN 36* 30*  CREATININE 1.48* 1.28*  CALCIUM 9.5 8.9   Liver Function Tests: Recent Labs  Lab 01/07/21 1249  AST 20  ALT 9  ALKPHOS 79  BILITOT 0.7  PROT 6.5  ALBUMIN 3.9   No results for input(s): LIPASE, AMYLASE in the last 168 hours. No results for input(s): AMMONIA in the last 168 hours. CBC: Recent Labs  Lab 01/07/21 1213 01/08/21 0527  WBC 5.7 5.1  NEUTROABS 2.3  --   HGB 11.8* 10.8*  HCT 34.2* 30.7*  MCV 99.1 99.4  PLT 218 195   Cardiac Enzymes: No results for input(s): CKTOTAL, CKMB, CKMBINDEX, TROPONINI in the last 168 hours. BNP: BNP (last 3 results) Recent Labs    07/01/20 0043  BNP 964.7*    ProBNP (last 3 results) No results for input(s): PROBNP in the last 8760 hours.  CBG: Recent Labs  Lab 01/08/21 1221  GLUCAP 101*       Signed:  Rhetta Mura MD   [facility] 01/09/2021, 3:52 PM

## 2021-01-09 NOTE — Evaluation (Signed)
Physical Therapy Evaluation Patient Details Name: Crystal [name] MRN: [id number] DOB: 05/30/1951 Today's Date: 01/09/2021   History of Present Illness  Crystal [name] is a 70 y/o F w/ PMH of HTN, HLD, GERD, anxiety, bipolar, schizophrenia who presented w/ seizure like activity yhe morning of admission. Pt evidently had 2 episodes of shaking over her entire body that lasted about 25-30 mins. Pt was awake during these episodes but was unable to talk.  Clinical Impression  Pt was eager to try and work with PT and did well with bed mobility and showed generally functional strength.  However in sitting at EOB PT was attempting to do strength testing and with lightly resisted hip flexion it started clonic tremoring that quickly spread to all 4 limbs and PT had to assist pt back to sidelying, nursing arrived and further PT was deferred.  Per medical situation it is likely that pt should be able to manage at home, however unable to do complete eval today 2/2 seizure activity.    Follow Up Recommendations  (limited eval 2/2 seizure, likely could go home once these issues have been addressed), HHPT per further assessment    Equipment Recommendations   (TBD)    Recommendations for Other Services       Precautions / Restrictions Precautions Precautions: Fall (seizure) Restrictions Weight Bearing Restrictions: No      Mobility  Bed Mobility Overal bed mobility: Needs Assistance Bed Mobility: Supine to Sit;Sit to Supine     Supine to sit: Supervision Sit to supine: Max assist (pt having seizure, assisted back to bed)        Transfers                 General transfer comment: deferred 2/2 having a seizure  Ambulation/Gait                Stairs            Wheelchair Mobility    Modified Rankin (Stroke Patients Only)       Balance Overall balance assessment: Needs assistance                                           Pertinent Vitals/Pain  Pain Assessment: No/denies pain    Home Living Family/patient expects to be discharged to:: Private residence Living Arrangements: Alone Available Help at Discharge: Family;Available PRN/intermittently Type of Home: House Home Access: Stairs to enter Entrance Stairs-Rails: Doctor, general practice of Steps: 4 Home Layout: One level Home Equipment: Cane - single point      Prior Function Level of Independence: Needs assistance   Gait / Transfers Assistance Needed: reports she is rarely out of the home but able to manage in the home as needed  ADL's / Homemaking Assistance Needed: pt's children assist with medication management, errands, groceries        Hand Dominance   Dominant Hand: Right    Extremity/Trunk Assessment   Upper Extremity Assessment Upper Extremity Assessment: Generalized weakness    Lower Extremity Assessment Lower Extremity Assessment:  (Sitting at EOB at start of LE strength testing (R hip flexion, specifically) she started having R LE clonus that quickly evolved into 4 limb tremoring and what appeared to be a seizure, further testing deferred)       Communication   Communication: No difficulties  Cognition Arousal/Alertness: Awake/alert Behavior During Therapy: [facility] for tasks  assessed/performed Overall Cognitive Status: No family/caregiver present to determine baseline cognitive functioning                                        General Comments General comments (skin integrity, edema, etc.): While sitting at EOB for strength testing (R hip flexion, specifically) she started having R LE clonus that quickly evolved into 4 limb tremoring and what appeared to be a seizure, nursing came to room administering meds - symptoms seem to last ~4 minutes and were high amplitude at times    Exercises     Assessment/Plan    PT Assessment Patient needs continued PT services  PT Problem List Decreased strength;Decreased range of  motion;Decreased activity tolerance;Decreased balance;Decreased mobility;Decreased coordination;Decreased knowledge of use of DME;Decreased safety awareness       PT Treatment Interventions DME instruction;Gait training;Stair training;Functional mobility training;Therapeutic exercise;Balance training;Therapeutic activities;Neuromuscular re-education;Patient/family education    PT Goals (Current goals can be found in the Care Plan section)  Acute Rehab PT Goals PT Goal Formulation: Patient unable to participate in goal setting Time For Goal Achievement: 01/23/21 Potential to Achieve Goals: Good    Frequency Min 2X/week   Barriers to discharge        Co-evaluation               AM-PAC PT "6 Clicks" Mobility  Outcome Measure Help needed turning from your back to your side while in a flat bed without using bedrails?: A Little Help needed moving from lying on your back to sitting on the side of a flat bed without using bedrails?: A Little Help needed moving to and from a bed to a chair (including a wheelchair)?: A Lot Help needed standing up from a chair using your arms (e.g., wheelchair or bedside chair)?: A Lot Help needed to walk in hospital room?: Total Help needed climbing 3-5 steps with a railing? : Total 6 Click Score: 12    End of Session   Activity Tolerance: Treatment limited secondary to medical complications (Comment) Patient left: in bed;with call bell/phone within reach;with nursing/sitter in room Nurse Communication:  (had a seizure) PT Visit Diagnosis: Difficulty in walking, not elsewhere classified (R26.2);Muscle weakness (generalized) (M62.81);Unsteadiness on feet (R26.81);Other symptoms and signs involving the nervous system (Z60.109)    Time: 3235-5732 PT Time Calculation (min) (ACUTE ONLY): 14 min   Charges:   PT Evaluation $PT Eval Low Complexity: 1 Low          Malachi Pro, DPT 01/09/2021, 1:18 PM

## 2021-01-09 NOTE — Care Management Obs Status (Signed)
MEDICARE OBSERVATION STATUS NOTIFICATION   Patient Details  Name: Crystal [name] MRN: [id number] Date of Birth: 15-Aug-1951   Medicare Observation Status Notification Given:  Yes (RN will give to patient)    Margarito Liner, LCSW 01/09/2021, 1:43 PM

## 2021-01-09 NOTE — Progress Notes (Signed)
Messaged MD to call family for update and plan. They have concerns.

## 2021-01-09 NOTE — Progress Notes (Signed)
NEUROLOGY CONSULTATION PROGRESS NOTE   Date of service: January 09, 2021 Patient Name: Crystal [name] MRN:  [id number] DOB:  December 02, 1950  Brief HPI   Crystal [name] is a 70 y.o. female with PMH significant for anxiety, bipolar, ischemic heart disease, COPD, CAD, depression, diabetes, GERD, gout, hypercholesterolemia, hypertension peripheral arterial disease admitted with recurring episodes of seizure-like activity. I reviewed the video of the event that I saw with arhythmic arm and leg jerking is less concerning for a clinical seizure. Klonopin withdrawal can potnetially cause seizures but she has been off Klonopin for 7 days and well outside the withdrawal window.  She has had several of these in the last couple of days, rEEG was completed yesterday and did not capture the event but was negative for any epileptogenic abnormalities. My suspicion is that these events are non epileptic based on review of the video of the event and the rEEG findings. She had recent MRI Brain, 2 weeks ago which was negative for any structural abnormalities. Repeat CTH here with no acute abnormalities.   Interval Hx   Discussed in detail with patient and sons at bedside that these events are non epileptic. Patient has not had any further episodes since. She has good insight and is eager to go home and work on getting herself better.  Vitals   Vitals:   01/08/21 2039 01/09/21 0109 01/09/21 0455 01/09/21 0900  BP: (!) 119/46 126/60 (!) 141/51 132/63  Pulse: 91 88 72 78  Resp: 18 17 16 18   Temp: 98.2 F (36.8 C)  98.7 F (37.1 C) 98.7 F (37.1 C)  TempSrc: Oral  Oral Oral  SpO2: 100% 100% 98% 98%  Weight:      Height:         Body mass index is 28.5 kg/m.  Physical Exam   General: Laying comfortably in bed; in no acute distress.  HENT: Normal oropharynx and mucosa. Normal external appearance of ears and nose.  Neck: Supple, no pain or tenderness  CV: No JVD. No peripheral edema.  Pulmonary: Symmetric  Chest rise. Normal respiratory effort.  Abdomen: Soft to touch, non-tender.  Ext: No cyanosis, edema, or deformity  Skin: No rash. Normal palpation of skin.   Musculoskeletal: Normal digits and nails by inspection. No clubbing.   Neurologic Examination  Mental status/Cognition: Alert, oriented to self, place, month and year, good attention.  Speech/language: Fluent, comprehension intact, object naming intact, repetition intact.  Cranial nerves:   CN II Pupils equal and reactive to light, no VF deficits    CN III,IV,VI EOM intact, no gaze preference or deviation, no nystagmus    CN V normal sensation in V1, V2, and V3 segments bilaterally    CN VII no asymmetry, no nasolabial fold flattening    CN VIII normal hearing to speech    CN IX & X normal palatal elevation, no uvular deviation    CN XI 5/5 head turn and 5/5 shoulder shrug bilaterally    CN XII midline tongue protrusion    Motor:  Muscle bulk: normal, tone normal Mvmt Root Nerve  Muscle Right Left Comments  SA C5/6 Ax Deltoid     EF C5/6 Mc Biceps     EE C6/7/8 Rad Triceps     WF C6/7 Med FCR     WE C7/8 PIN ECU     F Ab C8/T1 U ADM/FDI 5 5   HF L1/2/3 Fem Illopsoas 5 5   KE L2/3/4 Fem Quad  DF L4/5 D Peron Tib Ant     PF S1/2 Tibial Grc/Sol      Sensation:  Light touch Intact to light touch in all extremities.   Pin prick    Temperature    Vibration   Proprioception    Coordination/Complex Motor:  - Finger to Nose intact BL  Labs   Basic Metabolic Panel:  Lab Results  Component Value Date   NA 141 01/08/2021   K 5.0 01/08/2021   CO2 21 (L) 01/08/2021   GLUCOSE 109 (H) 01/08/2021   BUN 30 (H) 01/08/2021   CREATININE 1.28 (H) 01/08/2021   CALCIUM 8.9 01/08/2021   GFRNONAA 45 (L) 01/08/2021   GFRAA 37 (L) 07/03/2020   HbA1c:  Lab Results  Component Value Date   HGBA1C 6.7 (H) 07/01/2020   LDL:  Lab Results  Component Value Date   LDLCALC 80 07/01/2020   Urine Drug Screen:     Component  Value Date/Time   LABOPIA NONE DETECTED 01/07/2021 0920   LABOPIA POSITIVE (A) 12/09/2012 1621   COCAINSCRNUR NONE DETECTED 01/07/2021 0920   LABBENZ POSITIVE (A) 01/07/2021 0920   LABBENZ POSITIVE (A) 12/09/2012 1621   AMPHETMU NONE DETECTED 01/07/2021 0920   AMPHETMU NONE DETECTED 12/09/2012 1621   THCU NONE DETECTED 01/07/2021 0920   THCU NONE DETECTED 12/09/2012 1621   LABBARB NONE DETECTED 01/07/2021 0920   LABBARB NONE DETECTED 12/09/2012 1621    Alcohol Level     Component Value Date/Time   ETH <10 01/07/2021 1213   No results found for: PHENYTOIN, ZONISAMIDE, LAMOTRIGINE, LEVETIRACETA No results found for: PHENYTOIN, PHENOBARB, VALPROATE, CBMZ  Imaging and Diagnostic studies  CT Head Wo Contrast IMPRESSION: No acute intracranial abnormality is noted.  MRI Brain  1. Minimal generalized cortical atrophy, typical for age. 2. Some scattered T2/FLAIR hyperintense foci consistent with mild chronic microvascular ischemic change, typical for age. 3. No acute findings. Normal enhancement pattern.  MRI C spine: 1. Subtle T2 hyperintense signal within the spinal cord adjacent to the point of maximum stenosis at C3-C4. This is consistent with cervical myelopathy. 2. At C2-C3, there is mild spinal stenosis due to degenerative changes but no nerve root compression. 3. At C3-C4, there is severe spinal stenosis with an AP diameter of 5.9 mm due to retrolisthesis and other degenerative changes. There is moderately severe left left foraminal narrowing with some potential for left C4 nerve root compression. 4. At C4-C5, there is moderate spinal stenosis due to degenerative changes. There did not appear to be any nerve root compression. 5. At C5-C6, there is moderately severe spinal stenosis with an AP diameter of 6.0 mm due to retrolisthesis and other degenerative changes. There is moderate foraminal narrowing but there does not appear to be nerve root compression. 6.  At C6-C7, there is moderate spinal stenosis but no nerve root compression. 7. At C7-T1, there is mild spinal stenosis due to anterolisthesis and other degenerative changes but no nerve root compression.  REEG: CLASSIFICATION (EEG IMPRESSION): Normal (awake, asleep)  CLINICAL INTERPRETATION: The majority of the recording consisted of normal sleep.  There was no evidence to support a diagnosis of seizures on this recording.  A normal EEG does not exclude a diagnosis of epilepsy and should concern persist for convulsive events elective [facility] term video EEG monitoring would be recommended.  Impression   Crystal [name] is a 70 y.o. female with PMH significant for anxiety, bipolar, ischemic heart disease, COPD, CAD, depression, diabetes, GERD, gout, hypercholesterolemia, hypertension peripheral  arterial disease admitted with recurring episodes of seizure-like activity. I reviewed the video of the event which appeared to be consistent with Non epileptic events. Further workup with rEEG was negative for any epileptogenic abnormalities. She had recent MRI Brain, 2 weeks ago which was negative for any structural abnormalities. Repeat CTH here with no acute abnormalities. No AEDs at this time. Patient has not had any events since discussion with her about these potentially being Non epileptic events.  Recommendations  - Recommend urgent outpatient follow up with neurosurgery outpatient for the noted high cervical myelopathy. - No Anti epileptic medications at this time. - Follow up with Psychology for Psychotherapy. - Neurology inpatient team will signoff. Please feel free to contact us with any questions or concerns. ______________________________________________________________________   Thank you for the opportunity to take part in the care of this patient. If you have any further questions, please contact the neurology consultation attending.  Signed,  Erick Blinks [facility] Pager Number [phone]   Non Epileptic Events handout:  You have been diagnosed with Non-Epilepsy Events  .    Nonepileptic events (also called nonepilepsy seizures) are not caused by electrical activity in the brain. .    One in six people also have epilepsy seizures or has had them in the past. .    Nonepilepsy seizures may be associated with psychological conditions or other physical problems.  What are they? Events that look like seizures but are not due to epilepsy are called "nonepileptic seizures." Some people prefer to use the term "events" rather than seizures. You will see the terms used interchangeably here. A common type is described as psychogenic ([id number]), which means beginning in the mind. Psychogenic seizures or events are caused by subconsious thoughts, emotions or 'stress', not abnormal electrical activity in the brain. Doctors consider most of them psychological in nature, but not purposely produced. Usually the person is not aware that the spells are not "epileptic." The term "pseudoseizures" has also been used in the past to refer to these events, but we prefer to avoid this term as it is not accurate and has a negative meaning. It's important to know that some seizures that are not epilepsy could be caused by other physical problems. These are nonepilepsy seizures too, but not caused by a psychological condition. Further testing is needed to find the exact cause so they can be treated properly.  Are they common? Psychogenic nonepileptic events are common. About 20% of the patients referred to comprehensive epilepsy centers for video-EEG monitoring are found to have nonepileptic seizures. About 1 in 6 of these patients also has epileptic seizures or has had them in the past. Psychogenic nonepileptic events have been more widely recognized during the past several decades. They are most often seen in adolescents and young adults, but they also can occur in  children and the elderly. They are three times more common in females.  What do they look like? The events most often look like complex partial or tonic-clonic (grand mal) seizures. Family members report episodes in which the patient stiffens and jerks. Doctors rarely witness the actual event, so they are drawn toward the diagnosis of epilepsy. Often years can be spent trying to treat the spells as epileptic seizures without success.  How are they recognized? Certain kinds of movements and other patterns seem to be more common in psychogenic nonepileptic events than in seizures caused by epilepsy. Some of these patterns do occur occasionally in epileptic seizures, however, so having one of  them does not necessarily mean that the seizure was nonepileptic. Video-EEG monitoring is the best way of diagnosing nonepileptic seizures. The doctor may take steps to provoke a seizure and then ask a family member or friend to confirm that the event was the same as the usual kind.  Can they be treated? The good news is that psychogenic nonepileptic events can respond to treatment. A psychiatric evaluation helps sort out possible psychological problems and the types of treatment that may be needed. Being diagnosed with psychogenic seizures doesn't necessarily mean that a person has a serious psychiatric disorder. Yet treating the nonepilepsy or psychogenic seizures will involve treating whatever psychological problems may be present. .    Sometimes the episodes stop when the person learns that they are psychological. Learning what the diagnsis means and what it doesn't mean is very important. .    Some people can learn how to control the events with behavioral techniques such as relaxation therapy or other forms of cognitive behavioral therapy. .    Some people have depression or anxiety disorders that can be helped by medication. .    Counseling for a limited time is often helpful. Both individual and family therapy  may be recommended. .    People who have both epilepsy and psychogeniMarland Kitchenc nonepilepsy seizures or events will require seizure medication as well as treatments for the psychogenic events. Authored by: Philipp Deputyrrin Devinsky, MD Reviewed by: Joen LauraPatricia O. Pamalee LeydenShafer, RN, [place]  Maralyn SagoJoseph I. Sirven, MD on 09/2011

## 2021-01-09 NOTE — Consult Note (Signed)
Referring Physician:  No referring provider defined for this encounter.  Primary Physician:  Lonie Peak, PA-C  Chief Complaint:  Seizure-like episodes  History of Present Illness: 01/09/2021 Crystal [name] is a 70 y.o. female who presents with the chief complaint of seizure like episodes.  She is admitted to medicine and has had neurology weigh regarding her condition. She is now stable, but has had 2 episodes today.  She reports insidious onset of hand weakness, numbness, and balance difficulty.  This has been ongoing for several months.  Montine P Aki has symptoms of cervical myelopathy.  Review of Systems:  A 10 point review of systems is negative, except for the pertinent positives and negatives detailed in the HPI.  Past Medical History: Past Medical History:  Diagnosis Date  . Angina pectoris (HCC)   . Anxiety   . ASCVD (arteriosclerotic cardiovascular disease)    single vessel  . ASD (atrial septal defect)    Small ASD with insignificant shunt  . Back pain, chronic   . Chronic diarrhea   . Chronic ischemic heart disease   . Claudication (HCC)   . COPD (chronic obstructive pulmonary disease) (HCC)   . Coronary atherosclerosis of native coronary artery   . Depression   . Diabetes mellitus without complication (HCC)   . DJD (degenerative joint disease)    ankle and foot  . Eczema   . Edema   . GERD (gastroesophageal reflux disease)   . Gout   . Hypercholesteremia   . Hypertension   . Low back pain   . MRSA carrier    Chronic  . Occlusion and stenosis of carotid artery without mention of cerebral infarction    Last carotid doppler was in 2006. Patch angioplasty right femoral artery 01/1998, patenet 09/2005 ([facility])  . Osteoarthritis   . Osteopenia   . Other and unspecified angina pectoris   . PAD (peripheral artery disease) (HCC)   . Postsurgical aortocoronary bypass status   . Postsurgical percutaneous transluminal coronary angioplasty status   .  Pseudodementia   . Right carotid bruit    Negative doppler 09/2002  . SOB (shortness of breath)    ECHO 04/13/12 EF estimated at 55-60%    Past Surgical History: Past Surgical History:  Procedure Laterality Date  . CORONARY ANGIOPLASTY WITH STENT PLACEMENT  01/15/98   PTCA stent RCA, PTCA diagonal  . CORONARY ANGIOPLASTY WITH STENT PLACEMENT  08/17/00   Cutting balloon ostial RCA  . CORONARY ANGIOPLASTY WITH STENT PLACEMENT  01/19/01   Unsuccessful attempted PTCA ostial RCA, stent protruding into root  . CORONARY ANGIOPLASTY WITH STENT PLACEMENT  01/20/01   RIMA-RCA, occluded cath 10/2003  . CORONARY ANGIOPLASTY WITH STENT PLACEMENT  02/02/04   Successful PTCA/drug eluding stent ostial RCA, [facility]  . hysterectomy     vaginal, for heavy periods, with BSO  . LEFT HEART CATH AND CORONARY ANGIOGRAPHY N/A 07/02/2020   Procedure: LEFT HEART CATH AND CORONARY ANGIOGRAPHY;  Surgeon: [place], Peter M, MD;  Location: [facility] INVASIVE CV LAB;  Service: Cardiovascular;  Laterality: N/A;  . right leg vein surgery for DVT    . TUBAL LIGATION      Allergies: Allergies as of 01/07/2021 - Review Complete 01/07/2021  Allergen Reaction Noted  . Bactrim [sulfamethoxazole-trimethoprim] Nausea And Vomiting 10/21/2013  . Penicillins Other (See Comments) 12/09/2012  . Sulfa antibiotics Other (See Comments) 12/09/2012    Medications:  Current Facility-Administered Medications:  .  0.9 %  sodium chloride infusion, , Intravenous, Continuous,  Charise Killian, MD, Last Rate: 75 mL/hr at 01/08/21 0809, New Bag at 01/08/21 0809 .  acetaminophen (TYLENOL) tablet 650 mg, 650 mg, Oral, Q6H PRN **OR** acetaminophen (TYLENOL) suppository 650 mg, 650 mg, Rectal, Q6H PRN, Charise Killian, MD .  aspirin EC tablet 81 mg, 81 mg, Oral, Daily, Charise Killian, MD, 81 mg at 01/09/21 1304 .  atorvastatin (LIPITOR) tablet 80 mg, 80 mg, Oral, Daily, Charise Killian, MD, 80 mg at 01/09/21 1304 .  bisacodyl  (DULCOLAX) EC tablet 5 mg, 5 mg, Oral, Daily PRN, Charise Killian, MD .  enoxaparin (LOVENOX) injection 40 mg, 40 mg, Subcutaneous, Q24H, Charise Killian, MD, 40 mg at 01/08/21 2100 .  feeding supplement (ENSURE ENLIVE / ENSURE PLUS) liquid 237 mL, 237 mL, Oral, TID BM, Charise Killian, MD, 237 mL at 01/08/21 2009 .  ipratropium-albuterol (DUONEB) 0.5-2.5 (3) MG/3ML nebulizer solution 3 mL, 3 mL, Nebulization, Q6H PRN, Charise Killian, MD .  isosorbide mononitrate (IMDUR) 24 hr tablet 60 mg, 60 mg, Oral, Daily, Charise Killian, MD, 60 mg at 01/09/21 1304 .  LORazepam (ATIVAN) injection 1 mg, 1 mg, Intravenous, Q4H PRN, Charise Killian, MD, 1 mg at 01/09/21 1026 .  mometasone-formoterol (DULERA) 100-5 MCG/ACT inhaler 2 puff, 2 puff, Inhalation, BID, Charise Killian, MD, 2 puff at 01/09/21 1304 .  OLANZapine (ZYPREXA) tablet 2.5 mg, 2.5 mg, Oral, QHS, Charise Killian, MD, 2.5 mg at 01/08/21 2100 .  ondansetron (ZOFRAN) tablet 4 mg, 4 mg, Oral, Q6H PRN **OR** ondansetron (ZOFRAN) injection 4 mg, 4 mg, Intravenous, Q6H PRN, Charise Killian, MD .  sodium chloride flush (NS) 0.9 % injection 3 mL, 3 mL, Intravenous, Q12H, Charise Killian, MD, 3 mL at 01/09/21 1308   Social History: Social History   Tobacco Use  . Smoking status: Former Smoker    Packs/day: 2.00    Years: 40.00    Pack years: 80.00    Types: Cigarettes    Quit date: 09/29/2010    Years since quitting: 10.2  . Smokeless tobacco: Never Used  Substance Use Topics  . Alcohol use: No  . Drug use: No    Family Medical History: Family History  Problem Relation Age of Onset  . Heart attack Mother   . Hypercholesterolemia Mother   . Hypertension Mother   . Dementia Neg Hx   . Alzheimer's disease Neg Hx     Physical Examination: Vitals:   01/09/21 0455 01/09/21 0900  BP: (!) 141/51 132/63  Pulse: 72 78  Resp: 16 18  Temp: 98.7 F (37.1 C) 98.7 F (37.1 C)  SpO2: 98% 98%      General: Patient is well developed, well nourished, calm, collected, and in no apparent distress.  Psychiatric: Patient is non-anxious.  Head:  Pupils equal, round, and reactive to light.  ENT:  Oral mucosa appears well hydrated.  Neck:   Supple.  Full range of motion.  Respiratory: Patient is breathing without any difficulty.  Extremities: No edema.  Vascular: Palpable pulses in dorsal pedal vessels.  Skin:   On exposed skin, there are no abnormal skin lesions.  NEUROLOGICAL:  General: In no acute distress.   Awake, alert, oriented to person, place, and time.  Pupils equal round and reactive to light.  Facial tone is symmetric.  Tongue protrusion is midline.  There is no pronator drift.  ROM of spine: full.  Strength: Side Biceps Triceps Deltoid Interossei Grip Wrist Ext. Wrist Flex.  R 5 5 5 5 5 5 5   L 5 5 5 5 5 5 5    Side Iliopsoas Quads Hamstring PF DF EHL  R 5 5 5 5 5 5   L 5 5 5 5 5 5     Bilateral upper and lower extremity sensation is intact to light touch. Reflexes are 4+ and symmetric at the biceps, triceps, brachioradialis, patella and achilles. Hoffman's is present.  Clonus is present.   Gait is untested.  Imaging: MRI C spine 12/25/20 IMPRESSION: This MRI of the cervical spine without contrast shows the following: 1.   Subtle T2 hyperintense signal within the spinal cord adjacent to the point of maximum stenosis at C3-C4.  This is consistent with cervical myelopathy. 2.   At C2-C3, there is mild spinal stenosis due to degenerative changes but no nerve root compression. 3.   At C3-C4, there is severe spinal stenosis with an AP diameter of 5.9 mm due to retrolisthesis and other degenerative changes.  There is moderately severe left left foraminal narrowing with some potential for left C4 nerve root compression. 4.   At C4-C5, there is moderate spinal stenosis due to degenerative changes.  There did not appear to be any nerve root compression. 5.   At  C5-C6, there is moderately severe spinal stenosis with an AP diameter of 6.0 mm due to retrolisthesis and other degenerative changes.  There is moderate foraminal narrowing but there does not appear to be nerve root compression. 6.   At C6-C7, there is moderate spinal stenosis but no nerve root compression. 7.   At C7-T1, there is mild spinal stenosis due to anterolisthesis and other degenerative changes but no nerve root compression.     INTERPRETING PHYSICIAN:  Richard A. , MD, PhD, FAAN Certified in  Neuroimaging by of Neuroimaging  I have personally reviewed the images and agree with the above interpretation.  Labs: CBC Latest Ref Rng & Units 01/08/2021 01/07/2021 12/12/2020  WBC 4.0 - 10.5 K/uL 5.1 5.7 6.4  Hemoglobin 12.0 - 15.0 g/dL 10.8(L) 11.8(L) 11.7  Hematocrit 36.0 - 46.0 % 30.7(L) 34.2(L) 34.8  Platelets 150 - 400 K/uL 195 218 226       Assessment and Plan: Ms. Messman is a pleasant 70 y.o. female with cervical myelopathy due to spondylosis.    She may ultimately meet indications for surgery, but I am concerned about her medical fitness for intervention.  Additionally, she would need to be stable from her neurologic illness before considering surgical intervention.  I will arrange follow-up for her.    Orvis Stann K. 03/09/2021 MD, MPHS Dept. of Neurosurgery

## 2021-01-09 NOTE — Hospital Course (Addendum)
70 year old white female known history of bipolar with predominant anxiety CAD HTN COPD DM TY 2 Reflux with hypercholesterolemia  Recent Rx [facility]-seen by psychiatry for SI psychosis with aggression-switched from Zyprexa to Seroquel, Klonopin discontinued-Wellbutrin and Cymbalta also discontinued She was seen at the [facility] ED subsequently for seizure-like activity?  2/2 Klonopin withdrawal  Family history reports on 4/11 had 20 to 25-minute [place] episode bilateral arm jerking arrhythmic arrhythmic leg jerking-patient can feel the seizure coming on and is aware throughout the period starts with right hand shaking and quickly spreads to the rest of the body  Rx Ativan Keppra on arrival to ED CT head without contrast - abnormality, MRI brain recently 3/29 normal MRI neck showed C2-C3 cervical myelopathy  Neurology consulted felt based on presentation and review of the video less concerning for clinical seizure Routine EEG i 4/12 = no evidence to support seizures  PT input is pending  Data Sodium 141 Potassium 5.0 BUNs/creatinine 36/1.4-->30/1.2 Hemoglobin 11.8-->10.8 Platelet 195

## 2021-01-10 DIAGNOSIS — R569 Unspecified convulsions: Secondary | ICD-10-CM | POA: Diagnosis not present

## 2021-01-10 LAB — RENAL FUNCTION PANEL
Albumin: 3.5 g/dL (ref 3.5–5.0)
Anion gap: 7 (ref 5–15)
BUN: 34 mg/dL — ABNORMAL HIGH (ref 8–23)
CO2: 24 mmol/L (ref 22–32)
Calcium: 9.7 mg/dL (ref 8.9–10.3)
Chloride: 111 mmol/L (ref 98–111)
Creatinine, Ser: 1.25 mg/dL — ABNORMAL HIGH (ref 0.44–1.00)
GFR, Estimated: 46 mL/min — ABNORMAL LOW (ref >60)
Glucose, Bld: 128 mg/dL — ABNORMAL HIGH (ref 70–99)
Phosphorus: 4.4 mg/dL (ref 2.5–4.6)
Potassium: 5 mmol/L (ref 3.5–5.1)
Sodium: 142 mmol/L (ref 135–145)

## 2021-01-10 NOTE — Plan of Care (Signed)
End of Shift Summary:  Alert and oriented x4. VSS. Remained on room air, sats >93%. Ambulated up to bathroom with standby, no contact assist. Telesitter in place. C/o headache, managed with prn tylenol x1. Denies n/v. No seizure-like activity noted this shift. Remained free from falls or injury. Call bell within reach and able to use.   Problem: Education: Goal: Expressions of having a comfortable level of knowledge regarding the disease process will increase Outcome: Progressing   Problem: Coping: Goal: Ability to adjust to condition or change in health will improve Outcome: Progressing Goal: Ability to identify appropriate support needs will improve Outcome: Progressing   Problem: Health Behavior/Discharge Planning: Goal: Compliance with prescribed medication regimen will improve Outcome: Progressing   Problem: Medication: Goal: Risk for medication side effects will decrease Outcome: Progressing   Problem: Safety: Goal: Verbalization of understanding the information provided will improve Outcome: Progressing   Problem: Education: Goal: Knowledge of General Education information will improve Description: Including pain rating scale, medication(s)/side effects and non-pharmacologic comfort measures Outcome: Progressing   Problem: Clinical Measurements: Goal: Ability to maintain clinical measurements within normal limits will improve Outcome: Progressing Goal: Will remain free from infection Outcome: Progressing Goal: Diagnostic test results will improve Outcome: Progressing Goal: Respiratory complications will improve Outcome: Progressing Goal: Cardiovascular complication will be avoided Outcome: Progressing   Problem: Health Behavior/Discharge Planning: Goal: Ability to manage health-related needs will improve Outcome: Progressing   Problem: Pain Managment: Goal: General experience of comfort will improve Outcome: Progressing   Problem: Coping: Goal: Level of  anxiety will decrease Outcome: Progressing   Problem: Activity: Goal: Risk for activity intolerance will decrease Outcome: Progressing   Problem: Safety: Goal: Ability to remain free from injury will improve Outcome: Progressing

## 2021-01-10 NOTE — TOC Transition Note (Signed)
Transition of Care [facility]) - CM/SW Discharge Note   Patient Details  Name: Crystal [name] MRN: [id number] Date of Birth: 1950/12/11  Transition of Care [facility]) CM/SW Contact:  Allayne Butcher, RN Phone Number: 01/10/2021, 11:21 AM   Clinical Narrative:    Crystal [name] has agreed to accept patient for home health services but they are a week out for start.  RNCM updated son, Crystal [name] that home health has been arranged but the start date will be a week out.  Crystal [name] verbalizes understanding.  Patient's daughter, Crystal [name] will be picking patient up today, she gets off work around [employer].  Crystal [name] with [facility] number given to Crystal [name] if he has any questions about home health he can call her direct.    Final next level of care: Home w Home Health Services Barriers to Discharge: No Barriers Identified   Patient Goals and CMS Choice Patient states their goals for this hospitalization and ongoing recovery are:: family agrees to home health services CMS Medicare.gov Compare Post Acute Care list provided to:: Patient Represenative (must comment) Choice offered to / list presented to : Adult Children  Discharge Placement                Patient to be transferred to facility by: Granddaughter will take her home   Patient and family notified of of transfer: 01/09/21  Discharge Plan and Services                DME Arranged: N/A         HH Arranged: RN,PT,Social Work [employer] Agency: [employer] [facility] Date HH Agency Contacted: 01/10/21 Time HH Agency Contacted: [id number] Representative spoke with at [facility] Agency: Crystal [name]  Social Determinants of Health (SDOH) Interventions     Readmission Risk Interventions No flowsheet data found.

## 2021-01-10 NOTE — Progress Notes (Signed)
MD notified of SBP in 160's for last couple hours despite pain management. However, patient is slightly anxious as she waits for her DC ride to arrive. MD aware and stated to monitor at 1602.

## 2021-01-10 NOTE — Progress Notes (Signed)
Patient and daughter received discharge educations and verbalized understanding of follow up care and medication regimen. Patient left with all of personal belongings including her dentures and clothing. IV was removed prior to D/C.

## 2021-01-10 NOTE — Progress Notes (Signed)
Patient seen examined and is stable for d/c with no further events overnight  HH to be ordered and set-up prior to d/c  Pleas Koch, MD [facility] 9:46 AM   No charge

## 2021-01-10 NOTE — TOC Progression Note (Signed)
Transition of Care [facility]) - Progression Note    Patient Details  Name: Crystal [name] MRN: [id number] Date of Birth: Oct 21, 1950  Transition of Care [facility]) CM/SW Contact  Allayne Butcher, RN Phone Number: 01/10/2021, 10:53 AM  Clinical Narrative:    Patient is medically cleared for discharge home with home health today.  RNCM is actively searching for home health services.  [facility] unable to accept and [facility] with [facility]. [facility] home health does not have staffing availability, [facility] does not service [place].  Referral given to [facility], waiting to see if they can accept.      Barriers to Discharge: No Barriers Identified  Expected Discharge Plan and Services           Expected Discharge Date: 01/10/21                                     Social Determinants of Health (SDOH) Interventions    Readmission Risk Interventions No flowsheet data found.

## 2021-01-10 NOTE — Discharge Instructions (Signed)
Non-Epileptic Seizures, Adult °A non-epileptic seizure is an event that can cause abnormal movements or a loss of consciousness. These events differ from epileptic seizures because they are not caused by abnormal electrical and chemical activity in the brain. °There are two types of non-epileptic seizures: °· Physiologic. This type results from an underlying problem with body function. °· Psychogenic. This type results from an underlying mental health disorder. °What are the causes? °The cause of this condition depends on the kind of non-epileptic seizure that you have. °Causes of physiologic non-epileptic seizures °· Sudden drop in blood pressure or heart rhythm problems. °· Low blood sugar (glucose) or low levels of salt (sodium) in your blood. °· Migraine. °· Sleep disorders or movement disorders. °· Certain medicines. °· Heavy use of drugs or alcohol. °· Head injuries. °Causes of psychogenic non-epileptic seizures °· Stress. °· Emotional trauma. °· Sexual or physical abuse. °· Big life events, such as divorce or death of a loved one. °· Mental health disorders, including anxiety and depression. °What are the signs or symptoms? °Symptoms of a non-epileptic seizure can be similar to those of an epileptic seizure. They may include: °· A change in attention or behavior. °· A loss of consciousness or fainting. °· Uncontrollable shaking (convulsions) with fast, jerking movements. °· Drooling, tongue biting, or grunting. °· Rapid eye movements. °· Being unable to control when you urinate or have bowel movements. °After a non-epileptic seizure, you may: °· Have a headache or sore muscles. °· Feel confused or sleepy. °Non-epileptic seizures usually: °· Do not cause physical injuries. °· Start slowly. °· Include crying or shrieking. °· Last longer than 2 minutes. °· Include pelvic thrusting. °How is this diagnosed? °Non-epileptic seizures may be diagnosed by medical history, physical exam, and symptoms. Your health care  provider may: °· Talk with your friends or relatives who have seen you have a seizure. °· Ask you to write down your seizure activity and the things that led up to the seizure, and ask you to share that information with him or her. °You may also have tests to look for causes of physiologic non-epileptic seizures. Tests may include: °· An electroencephalogram (EEG) to check the electrical activity in your brain. °· Video EEG in the hospital. This takes 2-7 days. °· Blood tests. °· An electrocardiogram (ECG) to check for an abnormal heart rhythm. °· A CT scan. °If your health care provider thinks you have had a psychogenic non-epileptic seizure, you may need to be evaluated by a mental health specialist.   °How is this treated? °The treatment for your non-epileptic seizures will depend on their cause. When the underlying condition is treated, your non-epileptic seizures should stop. Medicines to treat seizures do not help with non-epileptic events. °If your non-epileptic seizures are being caused by emotional trauma or stress, your health care provider may recommend that you see a mental health specialist. Treatment may include: °· Relaxation therapy or cognitive behavioral therapy (CBT). °· Medicines for depression or anxiety. °· Individual or family counseling. °Some people have both psychogenic non-epileptic seizures and epileptic seizures. If you have both of these types, you may be prescribed medicine to manage the epileptic seizures. °Follow these instructions at home: °Home care will depend on the type of non-epileptic seizures that you have. Follow this general guidance: °Avoid alcohol and drug use °Avoid using any substance that may prevent your medicine from working properly. If you are prescribed medicine for seizures: °· Do not use drugs. °· Limit or avoid drinking alcohol.   If you drink alcohol: °? Limit how much you have to: °§ 0-1 drink a day for women who are not pregnant. °§ 0-2 drinks a day for  men. °? Know how much alcohol is in your drink. In the [place] one drink equals one 12 oz bottle of beer (355 mL), one 5 oz glass of wine (148 mL), or one 1½ oz glass of hard liquor (44 mL). °Involve family, friends, and coworkers °Make sure family members, friends, and coworkers are trained in how to help you if you have a seizure. If you have a seizure, they should: °· Lay you on the ground to prevent a fall. °· Place a pillow or piece of clothing under your head. °· Loosen any clothing around your neck. °· Turn you onto your side. If you vomit, this position will help keep your windpipe clear. °General instructions °· Follow all instructions from your health care provider. These may include ways to prevent seizures and what to do if you have a seizure. °· Take over-the-counter and prescription medicines only as told by your health care provider. °· Keep all follow-up visits. This is important. °Contact a health care provider if: °· Your non-epileptic seizures change or happen more often. °· Your non-epileptic seizures do not stop after treatment. °Get help right away if: °· You injure yourself during a non-epileptic seizure. °· You have one non-epileptic seizure after another. °· You have trouble recovering from a non-epileptic seizure. °· You have chest pain or trouble breathing. °· You have a non-epileptic seizure that lasts longer than 5 minutes. °These symptoms may represent a serious problem that is an emergency. Do not wait to see if the symptoms will go away. Get medical help right away. Call your local emergency services (911 in the [place] Do not drive yourself to the hospital. °If you ever feel like you may hurt yourself or others, or have thoughts about taking your own life, get help right away. Go to your nearest emergency department or: °· Call your local emergency services (911 in the [place] °· Call a suicide crisis helpline, such as the National Suicide Prevention Lifeline at 1-800-273-8255. This is  open 24 hours a day in the [place] °· Text the Crisis Text Line at 741741 (in the [place] °Summary °· Non-epileptic seizures are events that can cause abnormal movements or a loss of consciousness. These events are caused by an underlying problem with body function or a mental health disorder. °· The treatment for your non-epileptic seizures will depend on their cause. When the underlying condition is treated, your non-epileptic seizures should stop. Medicines for seizures do not treat non-epileptic events. °· People with non-epileptic seizures may also have epileptic seizures and may need medicines to treat seizures. °· Make sure family members, friends, and coworkers are trained in how to help you if you have a non-epileptic seizure. This includes laying you on the ground to prevent a fall, protecting your head and neck, and turning you onto your side. °This information is not intended to replace advice given to you by your health care provider. Make sure you discuss any questions you have with your health care provider. °Document Revised: 03/02/2020 Document Reviewed: 03/02/2020 °Elsevier Patient Education © 2021 [employer] Inc. ° °

## 2021-01-14 ENCOUNTER — Encounter: Payer: Self-pay | Admitting: Psychology

## 2021-01-15 DIAGNOSIS — M4802 Spinal stenosis, cervical region: Secondary | ICD-10-CM | POA: Diagnosis not present

## 2021-01-15 DIAGNOSIS — N183 Chronic kidney disease, stage 3 unspecified: Secondary | ICD-10-CM | POA: Diagnosis not present

## 2021-01-15 DIAGNOSIS — R413 Other amnesia: Secondary | ICD-10-CM | POA: Diagnosis not present

## 2021-01-15 DIAGNOSIS — I259 Chronic ischemic heart disease, unspecified: Secondary | ICD-10-CM | POA: Diagnosis not present

## 2021-01-15 DIAGNOSIS — R569 Unspecified convulsions: Secondary | ICD-10-CM | POA: Diagnosis not present

## 2021-02-01 DIAGNOSIS — H905 Unspecified sensorineural hearing loss: Secondary | ICD-10-CM | POA: Diagnosis not present

## 2021-02-19 DIAGNOSIS — R569 Unspecified convulsions: Secondary | ICD-10-CM | POA: Diagnosis not present

## 2021-02-19 DIAGNOSIS — I259 Chronic ischemic heart disease, unspecified: Secondary | ICD-10-CM | POA: Diagnosis not present

## 2021-02-19 DIAGNOSIS — M4802 Spinal stenosis, cervical region: Secondary | ICD-10-CM | POA: Diagnosis not present

## 2021-02-19 DIAGNOSIS — L309 Dermatitis, unspecified: Secondary | ICD-10-CM | POA: Diagnosis not present

## 2021-02-19 DIAGNOSIS — E119 Type 2 diabetes mellitus without complications: Secondary | ICD-10-CM | POA: Diagnosis not present

## 2021-02-19 DIAGNOSIS — B372 Candidiasis of skin and nail: Secondary | ICD-10-CM | POA: Diagnosis not present

## 2021-02-19 DIAGNOSIS — N183 Chronic kidney disease, stage 3 unspecified: Secondary | ICD-10-CM | POA: Diagnosis not present

## 2021-02-21 NOTE — Telephone Encounter (Signed)
I called patient's son and LVM to discuss DaTscan/signing consent form.

## 2021-03-07 DIAGNOSIS — I10 Essential (primary) hypertension: Secondary | ICD-10-CM | POA: Diagnosis not present

## 2021-03-07 DIAGNOSIS — M47812 Spondylosis without myelopathy or radiculopathy, cervical region: Secondary | ICD-10-CM | POA: Diagnosis not present

## 2021-03-07 DIAGNOSIS — G959 Disease of spinal cord, unspecified: Secondary | ICD-10-CM | POA: Diagnosis not present

## 2021-03-08 ENCOUNTER — Telehealth: Payer: Self-pay | Admitting: *Deleted

## 2021-03-08 ENCOUNTER — Other Ambulatory Visit: Payer: Self-pay | Admitting: Neurological Surgery

## 2021-03-08 NOTE — Telephone Encounter (Signed)
DPR ok to s/w pt's son Crystal [name]. I s/w Jimmy in regards to needing a pre op clearance appt for his mother. Surgery is set for 03/26/21. I explained that I did not have anything until 04/10/21. I stated the pt has appt with Dr. Anne Fu 04/18/21. I gave the option to pt's son would he like to move appt up 1 week sooner and place pt on wait list or leave appt 7/21 and put on wait list for possible sooner appt. Pt's son opted to keep the 04/18/21 with Dr. Anne Fu and place pt on wait list. Pt's son is aware the surgery will need to be pushed out further until she has been cleared by Dr. Anne Fu. I will add pt to wait list. Will send notes to MD for appt 7/21. Will send FYI to surgeon's office procedure will need to be postponed.

## 2021-03-08 NOTE — Telephone Encounter (Signed)
Primary Cardiologist:Mark Anne Fu, MD  Chart reviewed as part of pre-operative protocol coverage. Because of Crystal [name]'s past medical history and time since last visit, he/she will require a follow-up visit in order to better assess preoperative cardiovascular risk.  Pre-op covering staff: - Please schedule appointment and call patient to inform them. - Please contact requesting surgeon's office via preferred method (i.e, phone, fax) to inform them of need for appointment prior to surgery.  If applicable, this message will also be routed to pharmacy pool and/or primary cardiologist for input on holding anticoagulant/antiplatelet agent as requested below so that this information is available at time of patient's appointment.   Ronney Asters, NP  03/08/2021, 4:32 PM

## 2021-03-08 NOTE — Telephone Encounter (Signed)
   [facility] Pre-operative Risk Assessment    Patient Name: Crystal [name]  DOB: 1951/05/10  MRN: [id number]   HEARTCARE STAFF: - Please ensure there is not already an duplicate clearance open for this procedure. - Under Visit Info/Reason for Call, type in Other and utilize the format Clearance MM/DD/YY or Clearance TBD. Do not use dashes or single digits. - If request is for dental extraction, please clarify the # of teeth to be extracted. - If the patient is currently at the dentist's office, call Pre-Op APP to address. If the patient is not currently in the dentist office, please route to the Pre-Op pool  Request for surgical clearance:  What type of surgery is being performed? CERVICAL FUSION   When is this surgery scheduled? 03/26/21   What type of clearance is required (medical clearance vs. Pharmacy clearance to hold med vs. Both)? MEDICAL  Are there any medications that need to be held prior to surgery and how [place]? ASA    Practice name and name of physician performing surgery? [facility]; DR. Pieter Partridge DAWLEY   What is the office phone number? [phone] ATTN: Crystal [name]   7.   What is the office fax number? [place]: Crystal [name]  8.   Anesthesia type (None, local, MAC, general) ? GENERAL   Crystal [name] 03/08/2021, 3:56 PM  _________________________________________________________________   (provider comments below)

## 2021-03-26 ENCOUNTER — Inpatient Hospital Stay: Admit: 2021-03-26 | Payer: Medicare Other | Admitting: Neurological Surgery

## 2021-03-26 SURGERY — POSTERIOR CERVICAL FUSION/FORAMINOTOMY LEVEL 3
Anesthesia: General

## 2021-04-10 DIAGNOSIS — M4802 Spinal stenosis, cervical region: Secondary | ICD-10-CM | POA: Diagnosis not present

## 2021-04-10 DIAGNOSIS — N183 Chronic kidney disease, stage 3 unspecified: Secondary | ICD-10-CM | POA: Diagnosis not present

## 2021-04-10 DIAGNOSIS — K219 Gastro-esophageal reflux disease without esophagitis: Secondary | ICD-10-CM | POA: Diagnosis not present

## 2021-04-10 DIAGNOSIS — M545 Low back pain, unspecified: Secondary | ICD-10-CM | POA: Diagnosis not present

## 2021-04-10 DIAGNOSIS — I259 Chronic ischemic heart disease, unspecified: Secondary | ICD-10-CM | POA: Diagnosis not present

## 2021-04-10 DIAGNOSIS — R252 Cramp and spasm: Secondary | ICD-10-CM | POA: Diagnosis not present

## 2021-04-10 DIAGNOSIS — E119 Type 2 diabetes mellitus without complications: Secondary | ICD-10-CM | POA: Diagnosis not present

## 2021-04-10 DIAGNOSIS — R569 Unspecified convulsions: Secondary | ICD-10-CM | POA: Diagnosis not present

## 2021-04-10 DIAGNOSIS — J449 Chronic obstructive pulmonary disease, unspecified: Secondary | ICD-10-CM | POA: Diagnosis not present

## 2021-04-12 ENCOUNTER — Other Ambulatory Visit: Payer: Self-pay

## 2021-04-18 ENCOUNTER — Encounter: Payer: Self-pay | Admitting: Cardiology

## 2021-04-18 ENCOUNTER — Ambulatory Visit (INDEPENDENT_AMBULATORY_CARE_PROVIDER_SITE_OTHER): Payer: Medicare Other | Admitting: Cardiology

## 2021-04-18 ENCOUNTER — Other Ambulatory Visit: Payer: Self-pay

## 2021-04-18 VITALS — BP 160/80 | HR 69 | Ht 60.0 in | Wt 135.0 lb

## 2021-04-18 DIAGNOSIS — I209 Angina pectoris, unspecified: Secondary | ICD-10-CM

## 2021-04-18 DIAGNOSIS — I10 Essential (primary) hypertension: Secondary | ICD-10-CM | POA: Diagnosis not present

## 2021-04-18 DIAGNOSIS — I739 Peripheral vascular disease, unspecified: Secondary | ICD-10-CM

## 2021-04-18 DIAGNOSIS — R0989 Other specified symptoms and signs involving the circulatory and respiratory systems: Secondary | ICD-10-CM

## 2021-04-18 DIAGNOSIS — I2510 Atherosclerotic heart disease of native coronary artery without angina pectoris: Secondary | ICD-10-CM | POA: Diagnosis not present

## 2021-04-18 DIAGNOSIS — I2583 Coronary atherosclerosis due to lipid rich plaque: Secondary | ICD-10-CM

## 2021-04-18 MED ORDER — METOPROLOL SUCCINATE ER 50 MG PO TB24: 50.0000 mg | ORAL_TABLET | Freq: Every day | ORAL | 3 refills | Status: DC

## 2021-04-18 NOTE — Patient Instructions (Signed)
Medication Instructions:  Please start Metoprolol Succinate 50 mg once a day. Continue all other medications as listed.  *If you need a refill on your cardiac medications before your next appointment, please call your pharmacy*  Testing/Procedures: Your physician has requested that you have a carotid duplex. This test is an ultrasound of the carotid arteries in your neck. It looks at blood flow through these arteries that supply the brain with blood. Allow one hour for this exam. There are no restrictions or special instructions.  Follow-Up: At [facility], you and your health needs are our priority.  As part of our continuing mission to provide you with exceptional heart care, we have created designated Provider Care Teams.  These Care Teams include your primary Cardiologist (physician) and Advanced Practice Providers (APPs -  Physician Assistants and Nurse Practitioners) who all work together to provide you with the care you need, when you need it.  We recommend signing up for the patient portal called "MyChart".  Sign up information is provided on this After Visit Summary.  MyChart is used to connect with patients for Virtual Visits (Telemedicine).  Patients are able to view lab/test results, encounter notes, upcoming appointments, etc.  Non-urgent messages can be sent to your provider as well.   To learn more about what you can do with MyChart, go to [email or web address].    Your next appointment:   1 year(s)  The format for your next appointment:   In Person  Provider:   Donato Schultz, MD   Thank you for choosing [facility]!!
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
## 2021-04-30 ENCOUNTER — Ambulatory Visit (HOSPITAL_COMMUNITY)
Admission: RE | Admit: 2021-04-30 | Payer: Medicare Other | Source: Ambulatory Visit | Attending: Cardiology | Admitting: Cardiology

## 2021-05-01 ENCOUNTER — Telehealth: Payer: Self-pay | Admitting: Cardiology

## 2021-05-01 NOTE — Telephone Encounter (Signed)
.    Pt c/o medication issue:  1. Name of Medication: metoprolol succinate (TOPROL-XL) 50 MG 24 hr tablet  2. How are you currently taking this medication (dosage and times per day)? 1 tablet by mouth daily  3. Are you having a reaction (difficulty breathing--STAT)? no  4. What is your medication issue? Patient called to say with this medicaiton metoprolol succinate (TOPROL-XL) 50 MG 24 hr tablet it slows a heartbeat done to 55 and also make her have high bp. Patient stop taking but wondering if she could take half of the medicaion.

## 2021-05-01 NOTE — Telephone Encounter (Signed)
Spoke with pt who reports her HR was 55 bpm and she stopped her Metoprolol 3 days ago.  She reports she just felt tired.  Advised fatigue or feeling tired is a common side effect of this medication however usually after taking it for 2-3 weeks her body will adjust and she should feel better.  Pt states understanding and will restart the medication as ordered.  She will c/b if any further concerns.

## 2021-05-06 ENCOUNTER — Ambulatory Visit (HOSPITAL_COMMUNITY): Payer: Medicare Other

## 2021-05-08 ENCOUNTER — Encounter: Payer: Medicare Other | Admitting: Psychology

## 2021-05-10 ENCOUNTER — Other Ambulatory Visit: Payer: Self-pay

## 2021-05-10 ENCOUNTER — Ambulatory Visit (HOSPITAL_COMMUNITY)
Admission: RE | Admit: 2021-05-10 | Discharge: 2021-05-10 | Disposition: A | Discharge status: Home / Self Care | Payer: Medicare Other | Source: Ambulatory Visit | Attending: Cardiology | Admitting: Cardiology

## 2021-05-10 DIAGNOSIS — R0989 Other specified symptoms and signs involving the circulatory and respiratory systems: Secondary | ICD-10-CM | POA: Diagnosis not present

## 2021-05-13 ENCOUNTER — Telehealth: Payer: Self-pay | Admitting: Cardiology

## 2021-05-13 NOTE — Telephone Encounter (Signed)
Pt c/o medication issue: 1. Name of Medication: metoprolol 2. How are you currently taking this medication (dosage and times per day)? One time a day 3. Are you having a reaction (difficulty breathing--STAT)?  No  4. What is your medication issue? HR is staying low

## 2021-05-13 NOTE — Telephone Encounter (Signed)
Left message for pt to call back to discuss HR.

## 2021-05-15 ENCOUNTER — Other Ambulatory Visit: Payer: Self-pay | Admitting: Neurological Surgery

## 2021-05-16 DIAGNOSIS — R1310 Dysphagia, unspecified: Secondary | ICD-10-CM | POA: Diagnosis not present

## 2021-05-16 DIAGNOSIS — R198 Other specified symptoms and signs involving the digestive system and abdomen: Secondary | ICD-10-CM | POA: Diagnosis not present

## 2021-05-16 DIAGNOSIS — K649 Unspecified hemorrhoids: Secondary | ICD-10-CM | POA: Diagnosis not present

## 2021-05-16 DIAGNOSIS — K219 Gastro-esophageal reflux disease without esophagitis: Secondary | ICD-10-CM | POA: Diagnosis not present

## 2021-05-20 DIAGNOSIS — H5213 Myopia, bilateral: Secondary | ICD-10-CM | POA: Diagnosis not present

## 2021-05-21 ENCOUNTER — Other Ambulatory Visit: Payer: Self-pay | Admitting: Neurological Surgery

## 2021-05-21 NOTE — Progress Notes (Addendum)
Surgical Instructions    Your procedure is scheduled on Thursday September 1st.  Report to [facility] Main Entrance "A" at 5:30 A.M., then check in with the Admitting office.  Call this number if you have problems the morning of surgery:  [phone]   If you have any questions prior to your surgery date call [phone]: Open Monday-Friday 8am-4pm    Remember:  Do not eat or drink anything after midnight the night before your surgery   Take these medicines the morning of surgery with A SIP OF WATER  atorvastatin (LIPITOR) 80 MG tablet esomeprazole (NEXIUM) 40 MG capsule famciclovir (FAMVIR) 250 MG tablet isosorbide mononitrate (IMDUR) 60 MG 24 hr tablet TRELEGY ELLIPTA 100-62.5-25 MCG/INH AEPB   IF NEEDED albuterol (PROVENTIL HFA;VENTOLIN HFA) 108 (90 BASE) MCG/ACT inhaler please bring with you to the hospital tiZANidine (ZANAFLEX) 2 MG tablet  Follow your surgeon's instructions on when to stop Aspirin.  If no instructions were given by your surgeon then you will need to call the office to get those instructions.     As of today, STOP taking any Aspirin (unless otherwise instructed by your surgeon) Aleve, Naproxen, Ibuprofen, Motrin, Advil, Goody's, BC's, all herbal medications, fish oil, and all vitamins.          Do not wear jewelry or makeup Do not wear lotions, powders, perfumes, or deodorant. Do not shave 48 hours prior to surgery.   Do not bring valuables to the hospital. DO Not wear nail polish, gel polish, artificial nails, or any other type of covering on  natural nails including finger and toenails. If patients have artificial nails, gel coating, etc. that need to be removed by a nail salon please have this removed prior to surgery or surgery may need to be canceled/delayed if the surgeon/ anesthesia feels like the patient is unable to be adequately monitored.             [facility] is not responsible for any belongings or valuables.  Do NOT Smoke  (Tobacco/Vaping) or drink Alcohol 24 hours prior to your procedure If you use a CPAP at night, you may bring all equipment for your overnight stay.   Contacts, glasses, dentures or bridgework may not be worn into surgery, please bring cases for these belongings   For patients admitted to the hospital, discharge time will be determined by your treatment team.   Patients discharged the day of surgery will not be allowed to drive home, and someone needs to stay with them for 24 hours.  ONLY 1 SUPPORT PERSON MAY BE PRESENT WHILE YOU ARE IN SURGERY. IF YOU ARE TO BE ADMITTED ONCE YOU ARE IN YOUR ROOM YOU WILL BE ALLOWED TWO (2) VISITORS.  Minor children may have two parents present. Special consideration for safety and communication needs will be reviewed on a case by case basis.  Special instructions:    Oral Hygiene is also important to reduce your risk of infection.  Remember - BRUSH YOUR TEETH THE MORNING OF SURGERY WITH YOUR REGULAR TOOTHPASTE   [place]- Preparing For Surgery  Before surgery, you can play an important role. Because skin is not sterile, your skin needs to be as free of germs as possible. You can reduce the number of germs on your skin by washing with CHG (chlorahexidine gluconate) Soap before surgery.  CHG is an antiseptic cleaner which kills germs and bonds with the skin to continue killing germs even after washing.     Please do not use  if you have an allergy to CHG or antibacterial soaps. If your skin becomes reddened/irritated stop using the CHG.  Do not shave (including legs and underarms) for at least 48 hours prior to first CHG shower. It is OK to shave your face.  Please follow these instructions carefully.     Shower the NIGHT BEFORE SURGERY and the MORNING OF SURGERY with CHG Soap.   If you chose to wash your hair, wash your hair first as usual with your normal shampoo. After you shampoo, rinse your hair and body thoroughly to remove the shampoo.  Then [employer] and genitals (private parts) with your normal soap and rinse thoroughly to remove soap.  After that Use CHG Soap as you would any other liquid soap. You can apply CHG directly to the skin and wash gently with a scrungie or a clean washcloth.   Apply the CHG Soap to your body ONLY FROM THE NECK DOWN.  Do not use on open wounds or open sores. Avoid contact with your eyes, ears, mouth and genitals (private parts). Wash Face and genitals (private parts)  with your normal soap.   Wash thoroughly, paying special attention to the area where your surgery will be performed.  Thoroughly rinse your body with warm water from the neck down.  DO NOT shower/wash with your normal soap after using and rinsing off the CHG Soap.  Pat yourself dry with a CLEAN TOWEL.  Wear CLEAN PAJAMAS to bed the night before surgery  Place CLEAN SHEETS on your bed the night before your surgery  DO NOT SLEEP WITH PETS.   Day of Surgery:  Take a shower with CHG soap. Wear Clean/Comfortable clothing the morning of surgery Do not apply any deodorants/lotions.   Remember to brush your teeth WITH YOUR REGULAR TOOTHPASTE.   Please read over the following fact sheets that you were given.

## 2021-05-22 ENCOUNTER — Encounter (HOSPITAL_COMMUNITY)
Admission: RE | Admit: 2021-05-22 | Discharge: 2021-05-22 | Disposition: A | Discharge status: Home / Self Care | Payer: Medicare Other | Source: Ambulatory Visit | Attending: Neurological Surgery | Admitting: Neurological Surgery

## 2021-05-22 ENCOUNTER — Other Ambulatory Visit: Payer: Self-pay

## 2021-05-22 ENCOUNTER — Encounter (HOSPITAL_COMMUNITY): Payer: Self-pay

## 2021-05-22 DIAGNOSIS — I2510 Atherosclerotic heart disease of native coronary artery without angina pectoris: Secondary | ICD-10-CM | POA: Insufficient documentation

## 2021-05-22 DIAGNOSIS — Z01812 Encounter for preprocedural laboratory examination: Secondary | ICD-10-CM | POA: Insufficient documentation

## 2021-05-22 HISTORY — DX: Personal history of urinary calculi: Z87.442

## 2021-05-22 LAB — BASIC METABOLIC PANEL
Anion gap: 9 (ref 5–15)
BUN: 15 mg/dL (ref 8–23)
CO2: 23 mmol/L (ref 22–32)
Calcium: 9.5 mg/dL (ref 8.9–10.3)
Chloride: 106 mmol/L (ref 98–111)
Creatinine, Ser: 1.2 mg/dL — ABNORMAL HIGH (ref 0.44–1.00)
GFR, Estimated: 49 mL/min — ABNORMAL LOW (ref >60)
Glucose, Bld: 107 mg/dL — ABNORMAL HIGH (ref 70–99)
Potassium: 4.3 mmol/L (ref 3.5–5.1)
Sodium: 138 mmol/L (ref 135–145)

## 2021-05-22 LAB — CBC
HCT: 34.5 % — ABNORMAL LOW (ref 36.0–46.0)
Hemoglobin: 12.3 g/dL (ref 12.0–15.0)
MCH: 34.6 pg — ABNORMAL HIGH (ref 26.0–34.0)
MCHC: 35.7 g/dL (ref 30.0–36.0)
MCV: 96.9 fL (ref 80.0–100.0)
Platelets: 230 10*3/uL (ref 150–400)
RBC: 3.56 MIL/uL — ABNORMAL LOW (ref 3.87–5.11)
RDW: 12.3 % (ref 11.5–15.5)
WBC: 7.8 10*3/uL (ref 4.0–10.5)
nRBC: 0 % (ref 0.0–0.2)

## 2021-05-22 LAB — TYPE AND SCREEN
ABO/RH(D): O POS
Antibody Screen: NEGATIVE

## 2021-05-22 LAB — HEMOGLOBIN A1C
Hgb A1c MFr Bld: 5.4 % (ref 4.8–5.6)
Mean Plasma Glucose: 108.28 mg/dL

## 2021-05-22 LAB — SURGICAL PCR SCREEN
MRSA, PCR: NEGATIVE
Staphylococcus aureus: NEGATIVE

## 2021-05-22 LAB — GLUCOSE, CAPILLARY: Glucose-Capillary: 109 mg/dL — ABNORMAL HIGH (ref 70–99)

## 2021-05-22 NOTE — Progress Notes (Addendum)
PCP - Lonie Peak, PA Cardiologist - Dr. Donato Schultz  Chest x-ray - Not indicated EKG - 04/18/21 Stress Test - Yes "It's has been a Sick while." ECHO - 07/01/2020 Cardiac Cath - 07/02/20  Sleep Study - No OSA  DM - type II CBG at PAT appt 109 Fasting Blood Sugar -  Not sure Checks Blood Sugar __once a week  Aspirin Instructions: Dr. Jake Samples has instructed her to stop she thinks Saturday or Sunday. On her calendar at home.  COVID TEST-  August 29th   Anesthesia review: Yes cardiac history  Patient denies shortness of breath, fever, cough and chest pain at PAT appointment   All instructions explained to the patient, with a verbal understanding of the material. Patient agrees to go over the instructions while at home for a better understanding. Patient also instructed to wear a mask while in public after being tested for COVID-19. The opportunity to ask questions was provided.

## 2021-05-23 NOTE — Anesthesia Preprocedure Evaluation (Addendum)
Anesthesia Evaluation  Patient identified by MRN, date of birth, ID band Patient awake    Reviewed: Allergy & Precautions, NPO status , Patient's Chart, lab work & pertinent test results  History of Anesthesia Complications Negative for: history of anesthetic complications  Airway Mallampati: II  TM Distance: >3 FB Neck ROM: Full    Dental  (+) Edentulous Upper, Edentulous Lower, Dental Advisory Given   Pulmonary neg shortness of breath, neg sleep apnea, COPD,  COPD inhaler, neg recent URI, Patient abstained from smoking., former smoker,    breath sounds clear to auscultation       Cardiovascular hypertension, Pt. on medications and Pt. on home beta blockers (-) angina+ CAD, + Past MI, + Cardiac Stents, + CABG and + Peripheral Vascular Disease   Rhythm:Regular  1. Left ventricular ejection fraction, by estimation, is 40 to 45%. The  left ventricle has mild to moderately decreased function. The left  ventricle demonstrates regional wall motion abnormalities (see scoring  diagram/findings for description). There is  mild left ventricular hypertrophy. Left ventricular diastolic parameters  are indeterminate. Wall motion abnormalities are suggestive of LAD  distribution infarct, although stress-induced cardiomyopathy is not  excluded.  2. Right ventricular systolic function is normal. The right ventricular  size is normal. Tricuspid regurgitation signal is inadequate for assessing  PA pressure.  3. The mitral valve is grossly normal. Trivial mitral valve  regurgitation.  4. The aortic valve is tricuspid. Aortic valve regurgitation is trivial.  5. The inferior vena cava is normal in size with greater than 50%  respiratory variability, suggesting right atrial pressure of 3 mmHg.   ? Mid LAD-1 lesion is 30% stenosed. ? Mid LAD-2 lesion is 30% stenosed. ? Prox Cx lesion is 40% stenosed. ? Ost RCA to Prox RCA lesion is 40%  stenosed. ? LV end diastolic pressure is normal.   1. Nonobstructive CAD. Continued patency of the stent in the RCA 2. Normal LVEDP    Neuro/Psych Seizures -, Well Controlled,  PSYCHIATRIC DISORDERS Anxiety Depression  Neuromuscular disease    GI/Hepatic Neg liver ROS, GERD  Medicated and Controlled,  Endo/Other  diabetes  Renal/GU CRFRenal diseaseLab Results      Component                Value               Date                      CREATININE               1.20 (H)            05/22/2021           Lab Results      Component                Value               Date                      K                        4.3                 05/22/2021                Musculoskeletal  (+) Arthritis ,   Abdominal   Peds  Hematology negative hematology  ROS (+) Lab Results      Component                Value               Date                      WBC                      7.8                 05/22/2021                HGB                      12.3                05/22/2021                HCT                      34.5 (L)            05/22/2021                MCV                      96.9                05/22/2021                PLT                      230                 05/22/2021              Anesthesia Other Findings Pertinent history includes coronary artery disease status post single-vessel CABG with right internal mammary artery/RIMA to RCA in 2002 with subsequent occlusion requiring PCI x2. She also has had a small ASD, peripheral arterial disease status post patch angioplasty to the right femoral artery, hypertension, hyperlipidemia, COPD, depression, pseudoseizures, and anxiety.  Last cath 07/02/2020 showed nonobstructive CAD with continued patency of the stent in the RCA.  She also had echo at that time showing mildly reduced EF 40 to 45%, which was ultimately felt due to stress cardiomyopathy given reassuring cath findings.  She was last seen by Dr. Anne Fu 04/18/2021 for preop  evaluation.  Per note, " She may proceed with spinal surgery/esophageal dilatation with low to moderate overall cardiac risk given her prior stent history/coronary artery disease.  This does appear to be stable however.  No contraindications to surgery.  She may hold her aspirin as necessary for these procedures."  Recent admission to [facility] April 2022 for cervical myelopathy and pseudoseizures.  She was noted to have episodes of bilateral arm jerking and leg jerking.  Patient was aware during these episodes. Neurology consulted felt based on presentation and review of the video less concerning for clinical seizure. Routine EEG i 4/12 = no evidence to support seizures.  She was recommended to follow-up outpatient with psychotherapy for episodes of pseudoseizures.  Reproductive/Obstetrics                            Anesthesia Physical Anesthesia Plan  ASA: 3  Anesthesia Plan:  General   Post-op Pain Management:    Induction: Intravenous  PONV Risk Score and Plan: 3 and Ondansetron and Dexamethasone  Airway Management Planned: Oral ETT  Additional Equipment: None  Intra-op Plan:   Post-operative Plan: Extubation in OR  Informed Consent: I have reviewed the patients History and Physical, chart, labs and discussed the procedure including the risks, benefits and alternatives for the proposed anesthesia with the patient or authorized representative who has indicated his/her understanding and acceptance.     Dental advisory given  Plan Discussed with: CRNA and Anesthesiologist  Anesthesia Plan Comments: (PAT note by Antionette Poles, PA-C: Pertinent history includes coronary artery disease status post single-vessel CABG with right internal mammary artery/RIMA to RCA in 2002 with subsequent occlusion requiring PCI x2. She also has had a small ASD, peripheral arterial disease status post patch angioplasty to the right femoral artery, hypertension, hyperlipidemia,  COPD, depression, pseudoseizures, and anxiety.  Last cath 07/02/2020 showed nonobstructive CAD with continued patency of the stent in the RCA.  She also had echo at that time showing mildly reduced EF 40 to 45%, which was ultimately felt due to stress cardiomyopathy given reassuring cath findings.  She was last seen by Dr. Anne Fu 04/18/2021 for preop evaluation.  Per note, " She may proceed with spinal surgery/esophageal dilatation with low to moderate overall cardiac risk given her prior stent history/coronary artery disease.  This does appear to be stable however.  No contraindications to surgery.  She may hold her aspirin as necessary for these procedures."  Recent admission to [facility] April 2022 for cervical myelopathy and pseudoseizures.  She was noted to have episodes of bilateral arm jerking and leg jerking.  Patient was aware during these episodes. Neurology consulted felt based on presentation and review of the video less concerning for clinical seizure. Routine EEG i 4/12 = no evidence to support seizures.  She was recommended to follow-up outpatient with psychotherapy for episodes of pseudoseizures.  Preop labs reviewed, creatinine mildly elevated 1.20, otherwise unremarkable.  EKG 04/18/2021: Sinus rhythm.  Rate 69.  Nonspecific ST and T wave changes.  Carotid duplex 05/10/2021: Summary:  Right Carotid: Velocities in the right ICA are consistent with a 1-39% stenosis. Non-hemodynamically significant plaque <50% noted in the CCA.  Essentially stable RICA velocities compared to prior exam.  Left Carotid: Velocities in the left ICA are consistent with a 1-39% stenosis. Non-hemodynamically significant plaque <50% noted in the CCA. Essentially stable LICA velocities compared to prior exam.  Vertebrals: Bilateral vertebral arteries demonstrate antegrade flow.  Subclavians: Bilateral subclavian arteries were stenotic. Bilateral subclavian artery flow was disturbed.   Cath 07/02/2020: 1.  Nonobstructive CAD. Continued patency of the stent in the RCA 2. Normal LVEDP  Plan: clinical presentation is more c/w stress induced cardiomyopathy/Takotsubo's disease. Medical management.  TTE 07/01/2020: 1. Left ventricular ejection fraction, by estimation, is 40 to 45%. The  left ventricle has mild to moderately decreased function. The left  ventricle demonstrates regional wall motion abnormalities (see scoring  diagram/findings for description). There is  mild left ventricular hypertrophy. Left ventricular diastolic parameters  are indeterminate. Wall motion abnormalities are suggestive of LAD  distribution infarct, although stress-induced cardiomyopathy is not  excluded.  2. Right ventricular systolic function is normal. The right ventricular  size is normal. Tricuspid regurgitation signal is inadequate for assessing  PA pressure.  3. The mitral valve is grossly normal. Trivial mitral valve  regurgitation.  4. The aortic valve is tricuspid. Aortic valve regurgitation is trivial.  5. The inferior vena cava is normal in size with greater than 50%  respiratory variability, suggesting right atrial pressure of 3 mmHg.   )       Anesthesia Quick Evaluation

## 2021-05-23 NOTE — Progress Notes (Signed)
Anesthesia Chart Review:  Pertinent history includes coronary artery disease status post single-vessel CABG with right internal mammary artery/RIMA to RCA in 2002 with subsequent occlusion requiring PCI x2.  She also has had a small ASD, peripheral arterial disease status post patch angioplasty to the right femoral artery, hypertension, hyperlipidemia, COPD, depression, pseudoseizures, and anxiety.  Last cath 07/02/2020 showed nonobstructive CAD with continued patency of the stent in the RCA.  She also had echo at that time showing mildly reduced EF 40 to 45%, which was ultimately felt due to stress cardiomyopathy given reassuring cath findings.  She was last seen by Dr. Anne Fu 04/18/2021 for preop evaluation.  Per note, " She may proceed with spinal surgery/esophageal dilatation with low to moderate overall cardiac risk given her prior stent history/coronary artery disease.  This does appear to be stable however.  No contraindications to surgery.  She may hold her aspirin as necessary for these procedures."  Recent admission to [facility] April 2022 for cervical myelopathy and pseudoseizures.  She was noted to have episodes of bilateral arm jerking and leg jerking.  Patient was aware during these episodes. Neurology consulted felt based on presentation and review of the video less concerning for clinical seizure. Routine EEG i 4/12 = no evidence to support seizures.  She was recommended to follow-up outpatient with psychotherapy for episodes of pseudoseizures.  Preop labs reviewed, creatinine mildly elevated 1.20, otherwise unremarkable.  EKG 04/18/2021: Sinus rhythm.  Rate 69.  Nonspecific ST and T wave changes.  Carotid duplex 05/10/2021: Summary:  Right Carotid: Velocities in the right ICA are consistent with a 1-39% stenosis. Non-hemodynamically significant plaque <50% noted in the CCA.  Essentially stable RICA velocities compared to prior exam.  Left Carotid: Velocities in the left ICA are consistent  with a 1-39% stenosis. Non-hemodynamically significant plaque <50% noted in the CCA. Essentially stable LICA velocities compared to prior exam.  Vertebrals:  Bilateral vertebral arteries demonstrate antegrade flow.  Subclavians: Bilateral subclavian arteries were stenotic. Bilateral subclavian artery flow was disturbed.   Cath 07/02/2020: 1. Nonobstructive CAD. Continued patency of the stent in the RCA 2. Normal LVEDP   Plan: clinical presentation is more c/w stress induced cardiomyopathy/Takotsubo's disease. Medical management.  TTE 07/01/2020:  1. Left ventricular ejection fraction, by estimation, is 40 to 45%. The  left ventricle has mild to moderately decreased function. The left  ventricle demonstrates regional wall motion abnormalities (see scoring  diagram/findings for description). There is   mild left ventricular hypertrophy. Left ventricular diastolic parameters  are indeterminate. Wall motion abnormalities are suggestive of LAD  distribution infarct, although stress-induced cardiomyopathy is not  excluded.   2. Right ventricular systolic function is normal. The right ventricular  size is normal. Tricuspid regurgitation signal is inadequate for assessing  PA pressure.   3. The mitral valve is grossly normal. Trivial mitral valve  regurgitation.   4. The aortic valve is tricuspid. Aortic valve regurgitation is trivial.   5. The inferior vena cava is normal in size with greater than 50%  respiratory variability, suggesting right atrial pressure of 3 mmHg.    Zannie Cove [facility] Short Stay Center/Anesthesiology Phone [phone] 05/23/2021 3:54 PM

## 2021-05-27 ENCOUNTER — Other Ambulatory Visit: Payer: Self-pay | Admitting: Neurological Surgery

## 2021-05-28 DIAGNOSIS — K2970 Gastritis, unspecified, without bleeding: Secondary | ICD-10-CM | POA: Diagnosis not present

## 2021-05-28 DIAGNOSIS — R1310 Dysphagia, unspecified: Secondary | ICD-10-CM | POA: Diagnosis not present

## 2021-05-28 DIAGNOSIS — K219 Gastro-esophageal reflux disease without esophagitis: Secondary | ICD-10-CM | POA: Diagnosis not present

## 2021-05-28 DIAGNOSIS — K449 Diaphragmatic hernia without obstruction or gangrene: Secondary | ICD-10-CM | POA: Diagnosis not present

## 2021-05-28 LAB — SARS CORONAVIRUS 2 (TAT 6-24 HRS): SARS Coronavirus 2: NEGATIVE

## 2021-05-30 ENCOUNTER — Inpatient Hospital Stay (HOSPITAL_COMMUNITY): Payer: Medicare Other | Admitting: Physician Assistant

## 2021-05-30 ENCOUNTER — Encounter (HOSPITAL_COMMUNITY): Payer: Self-pay | Admitting: Neurological Surgery

## 2021-05-30 ENCOUNTER — Inpatient Hospital Stay (HOSPITAL_COMMUNITY): Payer: Medicare Other | Admitting: Certified Registered Nurse Anesthetist

## 2021-05-30 ENCOUNTER — Encounter (HOSPITAL_COMMUNITY)
Admission: RE | Disposition: A | Discharge status: Home / Self Care | Payer: Self-pay | Source: Home / Self Care | Attending: Neurological Surgery

## 2021-05-30 ENCOUNTER — Ambulatory Visit: Payer: Medicare Other | Admitting: Psychology

## 2021-05-30 ENCOUNTER — Inpatient Hospital Stay (HOSPITAL_COMMUNITY): Payer: Medicare Other

## 2021-05-30 ENCOUNTER — Other Ambulatory Visit: Payer: Self-pay

## 2021-05-30 ENCOUNTER — Inpatient Hospital Stay (HOSPITAL_COMMUNITY)
Admission: RE | Admit: 2021-05-30 | Discharge: 2021-05-31 | DRG: 472 | Disposition: A | Discharge status: Home / Self Care | Payer: Medicare Other | Attending: Neurological Surgery | Admitting: Neurological Surgery

## 2021-05-30 DIAGNOSIS — Z8249 Family history of ischemic heart disease and other diseases of the circulatory system: Secondary | ICD-10-CM | POA: Diagnosis not present

## 2021-05-30 DIAGNOSIS — Z951 Presence of aortocoronary bypass graft: Secondary | ICD-10-CM

## 2021-05-30 DIAGNOSIS — G959 Disease of spinal cord, unspecified: Secondary | ICD-10-CM | POA: Diagnosis not present

## 2021-05-30 DIAGNOSIS — F32A Depression, unspecified: Secondary | ICD-10-CM | POA: Diagnosis not present

## 2021-05-30 DIAGNOSIS — Z955 Presence of coronary angioplasty implant and graft: Secondary | ICD-10-CM | POA: Diagnosis not present

## 2021-05-30 DIAGNOSIS — I252 Old myocardial infarction: Secondary | ICD-10-CM

## 2021-05-30 DIAGNOSIS — M4312 Spondylolisthesis, cervical region: Secondary | ICD-10-CM | POA: Diagnosis not present

## 2021-05-30 DIAGNOSIS — K219 Gastro-esophageal reflux disease without esophagitis: Secondary | ICD-10-CM | POA: Diagnosis not present

## 2021-05-30 DIAGNOSIS — M4802 Spinal stenosis, cervical region: Secondary | ICD-10-CM | POA: Diagnosis not present

## 2021-05-30 DIAGNOSIS — J449 Chronic obstructive pulmonary disease, unspecified: Secondary | ICD-10-CM | POA: Diagnosis present

## 2021-05-30 DIAGNOSIS — Z882 Allergy status to sulfonamides status: Secondary | ICD-10-CM | POA: Diagnosis not present

## 2021-05-30 DIAGNOSIS — F419 Anxiety disorder, unspecified: Secondary | ICD-10-CM | POA: Diagnosis present

## 2021-05-30 DIAGNOSIS — Z981 Arthrodesis status: Secondary | ICD-10-CM | POA: Diagnosis not present

## 2021-05-30 DIAGNOSIS — M4322 Fusion of spine, cervical region: Secondary | ICD-10-CM | POA: Diagnosis not present

## 2021-05-30 DIAGNOSIS — E7800 Pure hypercholesterolemia, unspecified: Secondary | ICD-10-CM | POA: Diagnosis present

## 2021-05-30 DIAGNOSIS — Z7982 Long term (current) use of aspirin: Secondary | ICD-10-CM | POA: Diagnosis not present

## 2021-05-30 DIAGNOSIS — Z87891 Personal history of nicotine dependence: Secondary | ICD-10-CM | POA: Diagnosis not present

## 2021-05-30 DIAGNOSIS — G992 Myelopathy in diseases classified elsewhere: Secondary | ICD-10-CM | POA: Diagnosis not present

## 2021-05-30 DIAGNOSIS — Z880 Allergy status to penicillin: Secondary | ICD-10-CM | POA: Diagnosis not present

## 2021-05-30 DIAGNOSIS — I10 Essential (primary) hypertension: Secondary | ICD-10-CM | POA: Diagnosis present

## 2021-05-30 DIAGNOSIS — I2510 Atherosclerotic heart disease of native coronary artery without angina pectoris: Secondary | ICD-10-CM | POA: Diagnosis not present

## 2021-05-30 DIAGNOSIS — Z79899 Other long term (current) drug therapy: Secondary | ICD-10-CM | POA: Diagnosis not present

## 2021-05-30 DIAGNOSIS — M50323 Other cervical disc degeneration at C6-C7 level: Secondary | ICD-10-CM | POA: Diagnosis not present

## 2021-05-30 DIAGNOSIS — M4313 Spondylolisthesis, cervicothoracic region: Secondary | ICD-10-CM | POA: Diagnosis not present

## 2021-05-30 DIAGNOSIS — Z419 Encounter for procedure for purposes other than remedying health state, unspecified: Secondary | ICD-10-CM

## 2021-05-30 DIAGNOSIS — E1151 Type 2 diabetes mellitus with diabetic peripheral angiopathy without gangrene: Secondary | ICD-10-CM | POA: Diagnosis not present

## 2021-05-30 HISTORY — PX: POSTERIOR CERVICAL FUSION/FORAMINOTOMY: SHX5038

## 2021-05-30 LAB — GLUCOSE, CAPILLARY
Glucose-Capillary: 116 mg/dL — ABNORMAL HIGH (ref 70–99)
Glucose-Capillary: 174 mg/dL — ABNORMAL HIGH (ref 70–99)
Glucose-Capillary: 225 mg/dL — ABNORMAL HIGH (ref 70–99)
Glucose-Capillary: 254 mg/dL — ABNORMAL HIGH (ref 70–99)

## 2021-05-30 LAB — ABO/RH: ABO/RH(D): O POS

## 2021-05-30 SURGERY — POSTERIOR CERVICAL FUSION/FORAMINOTOMY LEVEL 3
Anesthesia: General

## 2021-05-30 MED ORDER — PANTOPRAZOLE SODIUM 40 MG PO TBEC: 80.0000 mg | DELAYED_RELEASE_TABLET | Freq: Every day | ORAL | Status: DC

## 2021-05-30 MED ORDER — NICOTINE 21 MG/24HR TD PT24
21.0000 mg | MEDICATED_PATCH | Freq: Every day | TRANSDERMAL | Status: DC
  Administered 2021-05-30: 21 mg via TRANSDERMAL
  Filled 2021-05-30 (×2): qty 1

## 2021-05-30 MED ORDER — MENTHOL 3 MG MT LOZG: 1.0000 | LOZENGE | OROMUCOSAL | Status: DC | PRN

## 2021-05-30 MED ORDER — KETOROLAC TROMETHAMINE 15 MG/ML IJ SOLN
7.5000 mg | Freq: Four times a day (QID) | INTRAMUSCULAR | Status: AC
  Administered 2021-05-30 – 2021-05-31 (×4): 7.5 mg via INTRAVENOUS
  Filled 2021-05-30 (×4): qty 1

## 2021-05-30 MED ORDER — MIDAZOLAM HCL 2 MG/2ML IJ SOLN
INTRAMUSCULAR | Status: AC
  Filled 2021-05-30: qty 2

## 2021-05-30 MED ORDER — THROMBIN 20000 UNITS EX SOLR
CUTANEOUS | Status: AC
  Filled 2021-05-30: qty 20000

## 2021-05-30 MED ORDER — SODIUM CHLORIDE 0.9% FLUSH: 3.0000 mL | INTRAVENOUS | Status: DC | PRN

## 2021-05-30 MED ORDER — FLUTICASONE FUROATE-VILANTEROL 100-25 MCG/INH IN AEPB
1.0000 | INHALATION_SPRAY | Freq: Every day | RESPIRATORY_TRACT | Status: DC
  Administered 2021-05-31: 1 via RESPIRATORY_TRACT
  Filled 2021-05-30: qty 28

## 2021-05-30 MED ORDER — CRISABOROLE 2 % EX OINT: 1.0000 | TOPICAL_OINTMENT | Freq: Every day | CUTANEOUS | Status: DC | PRN

## 2021-05-30 MED ORDER — VANCOMYCIN HCL IN DEXTROSE 1-5 GM/200ML-% IV SOLN: 1000.0000 mg | INTRAVENOUS | Status: AC

## 2021-05-30 MED ORDER — FLUTICASONE-UMECLIDIN-VILANT 100-62.5-25 MCG/INH IN AEPB: 1.0000 | INHALATION_SPRAY | Freq: Every day | RESPIRATORY_TRACT | Status: DC

## 2021-05-30 MED ORDER — VANCOMYCIN HCL IN DEXTROSE 1-5 GM/200ML-% IV SOLN
INTRAVENOUS | Status: AC
  Administered 2021-05-30: 1000 mg via INTRAVENOUS
  Filled 2021-05-30: qty 200

## 2021-05-30 MED ORDER — SODIUM CHLORIDE 0.9 % IV SOLN: 250.0000 mL | INTRAVENOUS | Status: DC

## 2021-05-30 MED ORDER — ACETAMINOPHEN 160 MG/5ML PO SOLN: 1000.0000 mg | Freq: Once | ORAL | Status: DC | PRN

## 2021-05-30 MED ORDER — OXYCODONE HCL 5 MG/5ML PO SOLN: 5.0000 mg | Freq: Once | ORAL | Status: DC | PRN

## 2021-05-30 MED ORDER — HYDROMORPHONE HCL 1 MG/ML IJ SOLN: 0.5000 mg | INTRAMUSCULAR | Status: DC | PRN

## 2021-05-30 MED ORDER — SODIUM CHLORIDE 0.9% FLUSH
3.0000 mL | Freq: Two times a day (BID) | INTRAVENOUS | Status: DC
  Administered 2021-05-30 (×2): 3 mL via INTRAVENOUS

## 2021-05-30 MED ORDER — BACITRACIN ZINC 500 UNIT/GM EX OINT
TOPICAL_OINTMENT | CUTANEOUS | Status: AC
  Filled 2021-05-30: qty 28.35

## 2021-05-30 MED ORDER — PHENYLEPHRINE HCL-NACL 20-0.9 MG/250ML-% IV SOLN: INTRAVENOUS | Status: DC | PRN

## 2021-05-30 MED ORDER — ACETAMINOPHEN 650 MG RE SUPP: 650.0000 mg | RECTAL | Status: DC | PRN

## 2021-05-30 MED ORDER — SODIUM CHLORIDE 0.9 % IV SOLN: INTRAVENOUS | Status: DC

## 2021-05-30 MED ORDER — 0.9 % SODIUM CHLORIDE (POUR BTL) OPTIME
TOPICAL | Status: DC | PRN
  Administered 2021-05-30: 1000 mL

## 2021-05-30 MED ORDER — FENTANYL CITRATE (PF) 100 MCG/2ML IJ SOLN
INTRAMUSCULAR | Status: AC
  Filled 2021-05-30: qty 2

## 2021-05-30 MED ORDER — ACETAMINOPHEN 10 MG/ML IV SOLN: 1000.0000 mg | Freq: Once | INTRAVENOUS | Status: DC | PRN

## 2021-05-30 MED ORDER — PROPOFOL 10 MG/ML IV BOLUS
INTRAVENOUS | Status: DC | PRN
  Administered 2021-05-30: 130 mg via INTRAVENOUS
  Administered 2021-05-30: 30 mg via INTRAVENOUS

## 2021-05-30 MED ORDER — METHOCARBAMOL 1000 MG/10ML IJ SOLN
500.0000 mg | Freq: Four times a day (QID) | INTRAVENOUS | Status: DC | PRN
  Filled 2021-05-30: qty 5

## 2021-05-30 MED ORDER — BUPIVACAINE LIPOSOME 1.3 % IJ SUSP
INTRAMUSCULAR | Status: DC | PRN
  Administered 2021-05-30: 20 mL

## 2021-05-30 MED ORDER — RISPERIDONE 0.5 MG PO TABS
0.5000 mg | ORAL_TABLET | Freq: Every day | ORAL | Status: DC
  Administered 2021-05-30: 0.5 mg via ORAL
  Filled 2021-05-30: qty 1

## 2021-05-30 MED ORDER — CHLORHEXIDINE GLUCONATE 0.12 % MT SOLN: 15.0000 mL | Freq: Once | OROMUCOSAL | Status: AC

## 2021-05-30 MED ORDER — ISOSORBIDE MONONITRATE ER 60 MG PO TB24
60.0000 mg | ORAL_TABLET | Freq: Every day | ORAL | Status: DC
  Administered 2021-05-31: 60 mg via ORAL
  Filled 2021-05-30: qty 1

## 2021-05-30 MED ORDER — VANCOMYCIN HCL 1000 MG IV SOLR
INTRAVENOUS | Status: AC
  Filled 2021-05-30: qty 20

## 2021-05-30 MED ORDER — THROMBIN 20000 UNITS EX SOLR
CUTANEOUS | Status: DC | PRN
  Administered 2021-05-30: 5 mL via TOPICAL

## 2021-05-30 MED ORDER — LIDOCAINE-EPINEPHRINE 1 %-1:100000 IJ SOLN
INTRAMUSCULAR | Status: DC | PRN
  Administered 2021-05-30: 5 mL

## 2021-05-30 MED ORDER — VANCOMYCIN HCL 750 MG/150ML IV SOLN: 750.0000 mg | INTRAVENOUS | Status: DC

## 2021-05-30 MED ORDER — BUPIVACAINE LIPOSOME 1.3 % IJ SUSP
INTRAMUSCULAR | Status: AC
  Filled 2021-05-30: qty 20

## 2021-05-30 MED ORDER — ACETAMINOPHEN 10 MG/ML IV SOLN
INTRAVENOUS | Status: AC
  Filled 2021-05-30: qty 100

## 2021-05-30 MED ORDER — PROPOFOL 10 MG/ML IV BOLUS
INTRAVENOUS | Status: AC
  Filled 2021-05-30: qty 20

## 2021-05-30 MED ORDER — DEXAMETHASONE SODIUM PHOSPHATE 10 MG/ML IJ SOLN
INTRAMUSCULAR | Status: DC | PRN
  Administered 2021-05-30: 10 mg via INTRAVENOUS

## 2021-05-30 MED ORDER — METHOCARBAMOL 500 MG PO TABS
500.0000 mg | ORAL_TABLET | Freq: Four times a day (QID) | ORAL | Status: DC | PRN
  Administered 2021-05-30 (×2): 500 mg via ORAL
  Filled 2021-05-30 (×2): qty 1

## 2021-05-30 MED ORDER — BUPIVACAINE-EPINEPHRINE (PF) 0.5% -1:200000 IJ SOLN
INTRAMUSCULAR | Status: DC | PRN
  Administered 2021-05-30: 5 mL

## 2021-05-30 MED ORDER — OXYCODONE HCL 5 MG PO TABS: 5.0000 mg | ORAL_TABLET | Freq: Once | ORAL | Status: DC | PRN

## 2021-05-30 MED ORDER — ONDANSETRON HCL 4 MG PO TABS: 4.0000 mg | ORAL_TABLET | Freq: Four times a day (QID) | ORAL | Status: DC | PRN

## 2021-05-30 MED ORDER — BACITRACIN 500 UNIT/GM EX OINT
TOPICAL_OINTMENT | CUTANEOUS | Status: DC | PRN
  Administered 2021-05-30: 1 via TOPICAL

## 2021-05-30 MED ORDER — CEFAZOLIN SODIUM-DEXTROSE 1-4 GM/50ML-% IV SOLN
1.0000 g | Freq: Three times a day (TID) | INTRAVENOUS | Status: DC
  Administered 2021-05-30 – 2021-05-31 (×3): 1 g via INTRAVENOUS
  Filled 2021-05-30 (×3): qty 50

## 2021-05-30 MED ORDER — ATORVASTATIN CALCIUM 80 MG PO TABS
80.0000 mg | ORAL_TABLET | Freq: Every day | ORAL | Status: DC
  Administered 2021-05-31: 80 mg via ORAL
  Filled 2021-05-30: qty 1

## 2021-05-30 MED ORDER — FENTANYL CITRATE (PF) 250 MCG/5ML IJ SOLN
INTRAMUSCULAR | Status: AC
  Filled 2021-05-30: qty 5

## 2021-05-30 MED ORDER — PHENYLEPHRINE 40 MCG/ML (10ML) SYRINGE FOR IV PUSH (FOR BLOOD PRESSURE SUPPORT)
PREFILLED_SYRINGE | INTRAVENOUS | Status: DC | PRN
  Administered 2021-05-30 (×2): 200 ug via INTRAVENOUS

## 2021-05-30 MED ORDER — UMECLIDINIUM BROMIDE 62.5 MCG/INH IN AEPB
1.0000 | INHALATION_SPRAY | Freq: Every day | RESPIRATORY_TRACT | Status: DC
  Administered 2021-05-31: 1 via RESPIRATORY_TRACT
  Filled 2021-05-30: qty 7

## 2021-05-30 MED ORDER — CHLORHEXIDINE GLUCONATE CLOTH 2 % EX PADS: 6.0000 | MEDICATED_PAD | Freq: Once | CUTANEOUS | Status: DC

## 2021-05-30 MED ORDER — ACETAMINOPHEN 500 MG PO TABS: 1000.0000 mg | ORAL_TABLET | Freq: Once | ORAL | Status: DC | PRN

## 2021-05-30 MED ORDER — THROMBIN 5000 UNITS EX SOLR
CUTANEOUS | Status: AC
  Filled 2021-05-30: qty 5000

## 2021-05-30 MED ORDER — CEFAZOLIN SODIUM-DEXTROSE 2-4 GM/100ML-% IV SOLN
2.0000 g | INTRAVENOUS | Status: AC
  Administered 2021-05-30: 2 g via INTRAVENOUS

## 2021-05-30 MED ORDER — ORAL CARE MOUTH RINSE: 15.0000 mL | Freq: Once | OROMUCOSAL | Status: AC

## 2021-05-30 MED ORDER — ACETAMINOPHEN 325 MG PO TABS: 650.0000 mg | ORAL_TABLET | ORAL | Status: DC | PRN

## 2021-05-30 MED ORDER — SUGAMMADEX SODIUM 200 MG/2ML IV SOLN
INTRAVENOUS | Status: DC | PRN
  Administered 2021-05-30: 100 mg via INTRAVENOUS
  Administered 2021-05-30: 200 mg via INTRAVENOUS

## 2021-05-30 MED ORDER — BUPIVACAINE-EPINEPHRINE 0.5% -1:200000 IJ SOLN
INTRAMUSCULAR | Status: AC
  Filled 2021-05-30: qty 1

## 2021-05-30 MED ORDER — FAMCICLOVIR 500 MG PO TABS
250.0000 mg | ORAL_TABLET | Freq: Two times a day (BID) | ORAL | Status: DC
  Administered 2021-05-30 – 2021-05-31 (×2): 250 mg via ORAL
  Filled 2021-05-30 (×2): qty 0.5

## 2021-05-30 MED ORDER — FENTANYL CITRATE (PF) 250 MCG/5ML IJ SOLN
INTRAMUSCULAR | Status: DC | PRN
  Administered 2021-05-30 (×4): 50 ug via INTRAVENOUS

## 2021-05-30 MED ORDER — THROMBIN 5000 UNITS EX SOLR
OROMUCOSAL | Status: DC | PRN
  Administered 2021-05-30: 5 mL via TOPICAL

## 2021-05-30 MED ORDER — FENTANYL CITRATE (PF) 100 MCG/2ML IJ SOLN
25.0000 ug | INTRAMUSCULAR | Status: DC | PRN
  Administered 2021-05-30: 25 ug via INTRAVENOUS

## 2021-05-30 MED ORDER — LIDOCAINE 2% (20 MG/ML) 5 ML SYRINGE
INTRAMUSCULAR | Status: DC | PRN
  Administered 2021-05-30: 80 mg via INTRAVENOUS

## 2021-05-30 MED ORDER — ACETAMINOPHEN 10 MG/ML IV SOLN
INTRAVENOUS | Status: DC | PRN
  Administered 2021-05-30: 1000 mg via INTRAVENOUS

## 2021-05-30 MED ORDER — CHLORHEXIDINE GLUCONATE 0.12 % MT SOLN
OROMUCOSAL | Status: AC
  Administered 2021-05-30: 15 mL via OROMUCOSAL
  Filled 2021-05-30: qty 15

## 2021-05-30 MED ORDER — HYDROCODONE-ACETAMINOPHEN 7.5-325 MG PO TABS
1.0000 | ORAL_TABLET | ORAL | Status: DC | PRN
  Administered 2021-05-30 – 2021-05-31 (×4): 1 via ORAL
  Filled 2021-05-30 (×4): qty 1

## 2021-05-30 MED ORDER — PHENOL 1.4 % MT LIQD: 1.0000 | OROMUCOSAL | Status: DC | PRN

## 2021-05-30 MED ORDER — ROCURONIUM BROMIDE 10 MG/ML (PF) SYRINGE
PREFILLED_SYRINGE | INTRAVENOUS | Status: DC | PRN
  Administered 2021-05-30: 60 mg via INTRAVENOUS
  Administered 2021-05-30: 50 mg via INTRAVENOUS
  Administered 2021-05-30: 40 mg via INTRAVENOUS

## 2021-05-30 MED ORDER — EPHEDRINE SULFATE-NACL 50-0.9 MG/10ML-% IV SOSY
PREFILLED_SYRINGE | INTRAVENOUS | Status: DC | PRN
  Administered 2021-05-30: 10 mg via INTRAVENOUS
  Administered 2021-05-30: 5 mg via INTRAVENOUS

## 2021-05-30 MED ORDER — PROPOFOL 500 MG/50ML IV EMUL
INTRAVENOUS | Status: DC | PRN
  Administered 2021-05-30: 50 ug/kg/min via INTRAVENOUS

## 2021-05-30 MED ORDER — ONDANSETRON HCL 4 MG/2ML IJ SOLN: 4.0000 mg | Freq: Four times a day (QID) | INTRAMUSCULAR | Status: DC | PRN

## 2021-05-30 MED ORDER — ONDANSETRON HCL 4 MG/2ML IJ SOLN
INTRAMUSCULAR | Status: DC | PRN
  Administered 2021-05-30: 4 mg via INTRAVENOUS

## 2021-05-30 MED ORDER — MIDAZOLAM HCL 2 MG/2ML IJ SOLN
INTRAMUSCULAR | Status: DC | PRN
  Administered 2021-05-30: 1 mg via INTRAVENOUS

## 2021-05-30 MED ORDER — CEFAZOLIN SODIUM-DEXTROSE 2-4 GM/100ML-% IV SOLN
INTRAVENOUS | Status: AC
  Filled 2021-05-30: qty 100

## 2021-05-30 MED ORDER — ALBUTEROL SULFATE (2.5 MG/3ML) 0.083% IN NEBU: 3.0000 mL | INHALATION_SOLUTION | Freq: Four times a day (QID) | RESPIRATORY_TRACT | Status: DC | PRN

## 2021-05-30 MED ORDER — LACTATED RINGERS IV SOLN: INTRAVENOUS | Status: DC | PRN

## 2021-05-30 MED ORDER — LACTATED RINGERS IV SOLN: INTRAVENOUS | Status: DC

## 2021-05-30 MED ORDER — PHENYLEPHRINE HCL-NACL 20-0.9 MG/250ML-% IV SOLN
INTRAVENOUS | Status: DC | PRN
  Administered 2021-05-30: 25 ug/min via INTRAVENOUS

## 2021-05-30 MED ORDER — SENNOSIDES-DOCUSATE SODIUM 8.6-50 MG PO TABS: 1.0000 | ORAL_TABLET | Freq: Every evening | ORAL | Status: DC | PRN

## 2021-05-30 MED ORDER — VANCOMYCIN HCL 1 G IV SOLR
INTRAVENOUS | Status: DC | PRN
  Administered 2021-05-30: 1000 mg via TOPICAL

## 2021-05-30 MED ORDER — ALUM & MAG HYDROXIDE-SIMETH 200-200-20 MG/5ML PO SUSP: 30.0000 mL | Freq: Four times a day (QID) | ORAL | Status: DC | PRN

## 2021-05-30 SURGICAL SUPPLY — 83 items
BAG COUNTER SPONGE SURGICOUNT (BAG) ×4 IMPLANT
BAND RUBBER #18 3X1/16 STRL (MISCELLANEOUS) ×4 IMPLANT
BIT DRILL NEURO 2X3.1 SFT TUCH (MISCELLANEOUS) ×1 IMPLANT
BLADE BN FN 3.2XSTRL LF (MISCELLANEOUS) ×1 IMPLANT
BLADE BONE MILL FINE (MISCELLANEOUS) ×2
BNDG GAUZE ELAST 4 BULKY (GAUZE/BANDAGES/DRESSINGS) IMPLANT
BUR MATCHSTICK NEURO 3.0 LAGG (BURR) ×2 IMPLANT
BUR SABER NEURO 2.5 (BURR) IMPLANT
CARTRIDGE OIL MAESTRO DRILL (MISCELLANEOUS) IMPLANT
CNTNR URN SCR LID CUP LEK RST (MISCELLANEOUS) ×2 IMPLANT
CONT SPEC 4OZ STRL OR WHT (MISCELLANEOUS) ×2
COVER BACK TABLE 60X90IN (DRAPES) ×2 IMPLANT
COVER MAYO STAND STRL (DRAPES) IMPLANT
DECANTER SPIKE VIAL GLASS SM (MISCELLANEOUS) ×2 IMPLANT
DERMABOND ADVANCED (GAUZE/BANDAGES/DRESSINGS) ×1
DERMABOND ADVANCED .7 DNX12 (GAUZE/BANDAGES/DRESSINGS) ×1 IMPLANT
DIFFUSER DRILL AIR PNEUMATIC (MISCELLANEOUS) IMPLANT
DRAIN JACKSON RD 7FR 3/32 (WOUND CARE) IMPLANT
DRAPE C-ARM 42X72 X-RAY (DRAPES) ×4 IMPLANT
DRAPE LAPAROTOMY 100X72X124 (DRAPES) ×2 IMPLANT
DRAPE MICROSCOPE LEICA (MISCELLANEOUS) ×2 IMPLANT
DRAPE SURG 17X23 STRL (DRAPES) ×2 IMPLANT
DRILL NEURO 2X3.1 SOFT TOUCH (MISCELLANEOUS) ×2
DRSG OPSITE 4X5.5 SM (GAUZE/BANDAGES/DRESSINGS) ×2 IMPLANT
DRSG OPSITE POSTOP 3X4 (GAUZE/BANDAGES/DRESSINGS) ×2 IMPLANT
DRSG OPSITE POSTOP 4X6 (GAUZE/BANDAGES/DRESSINGS) ×2 IMPLANT
DURAPREP 26ML APPLICATOR (WOUND CARE) ×2 IMPLANT
ELECT BLADE INSULATED 4IN (ELECTROSURGICAL) ×2
ELECT BLADE INSULATED 6.5IN (ELECTROSURGICAL)
ELECT REM PT RETURN 9FT ADLT (ELECTROSURGICAL) ×2
ELECTRODE BLADE INSULATED 4IN (ELECTROSURGICAL) ×1 IMPLANT
ELECTRODE BLDE INSULATED 6.5IN (ELECTROSURGICAL) IMPLANT
ELECTRODE REM PT RTRN 9FT ADLT (ELECTROSURGICAL) ×1 IMPLANT
EVACUATOR 1/8 PVC DRAIN (DRAIN) ×2 IMPLANT
FEE INTRAOP CADWELL SUPPLY NCS (MISCELLANEOUS) ×1 IMPLANT
FEE INTRAOP MONITOR IMPULS NCS (MISCELLANEOUS) ×1 IMPLANT
GAUZE 4X4 16PLY ~~LOC~~+RFID DBL (SPONGE) ×2 IMPLANT
GAUZE SPONGE 4X4 12PLY STRL (GAUZE/BANDAGES/DRESSINGS) ×2 IMPLANT
GLOVE SRG 8 PF TXTR STRL LF DI (GLOVE) ×1 IMPLANT
GLOVE SURG LTX SZ8 (GLOVE) ×2 IMPLANT
GLOVE SURG UNDER POLY LF SZ8 (GLOVE) ×2
GOWN STRL REUS W/ TWL LRG LVL3 (GOWN DISPOSABLE) ×2 IMPLANT
GOWN STRL REUS W/ TWL XL LVL3 (GOWN DISPOSABLE) ×2 IMPLANT
GOWN STRL REUS W/TWL 2XL LVL3 (GOWN DISPOSABLE) IMPLANT
GOWN STRL REUS W/TWL LRG LVL3 (GOWN DISPOSABLE) ×4
GOWN STRL REUS W/TWL XL LVL3 (GOWN DISPOSABLE) ×4
HEMOSTAT POWDER KIT SURGIFOAM (HEMOSTASIS) ×2 IMPLANT
INTRAOP CADWELL SUPPLY FEE NCS (MISCELLANEOUS) ×1
INTRAOP DISP SUPPLY FEE NCS (MISCELLANEOUS) ×2
INTRAOP MONITOR FEE IMPULS NCS (MISCELLANEOUS) ×1
INTRAOP MONITOR FEE IMPULSE (MISCELLANEOUS) ×2
KIT BASIN OR (CUSTOM PROCEDURE TRAY) ×2 IMPLANT
KIT TURNOVER KIT B (KITS) ×2 IMPLANT
MARKER SKIN DUAL TIP RULER LAB (MISCELLANEOUS) ×2 IMPLANT
NEEDLE BLUNT 18X1 FOR OR ONLY (NEEDLE) IMPLANT
NEEDLE HYPO 21X1.5 SAFETY (NEEDLE) ×2 IMPLANT
NEEDLE HYPO 25X1 1.5 SAFETY (NEEDLE) ×2 IMPLANT
NEEDLE SPNL 18GX3.5 QUINCKE PK (NEEDLE) ×4 IMPLANT
NS IRRIG 1000ML POUR BTL (IV SOLUTION) ×2 IMPLANT
OIL CARTRIDGE MAESTRO DRILL (MISCELLANEOUS)
PACK LAMINECTOMY NEURO (CUSTOM PROCEDURE TRAY) ×2 IMPLANT
PAD ARMBOARD 7.5X6 YLW CONV (MISCELLANEOUS) ×6 IMPLANT
PATTIES SURGICAL .5 X1 (DISPOSABLE) IMPLANT
PUTTY BONE 100 VESUVIUS 2.5CC (Putty) ×2 IMPLANT
ROD YUKON CONT 3.5X65 (Rod) ×4 IMPLANT
SCREW PA YUKON 3.5X12 (Screw) ×10 IMPLANT
SCREW PA YUKON 4X24 (Screw) ×4 IMPLANT
SCREW SET SPINAL YUKON (Set) ×18 IMPLANT
SCREW SPIN YUKON POLY 03.5X14 (Screw) ×4 IMPLANT
SPONGE SURGIFOAM ABS GEL 100 (HEMOSTASIS) ×2 IMPLANT
SPONGE T-LAP 4X18 ~~LOC~~+RFID (SPONGE) ×2 IMPLANT
STAPLER VISISTAT 35W (STAPLE) IMPLANT
SUT VIC AB 0 CT1 27 (SUTURE) ×2
SUT VIC AB 0 CT1 27XBRD ANBCTR (SUTURE) ×1 IMPLANT
SUT VIC AB 2-0 CP2 18 (SUTURE) ×4 IMPLANT
SUT VIC AB 3-0 SH 8-18 (SUTURE) ×2 IMPLANT
SYR 20ML LL LF (SYRINGE) ×2 IMPLANT
SYR 3ML LL SCALE MARK (SYRINGE) IMPLANT
TAP YUKON 3.0 DISP (TAP) ×2 IMPLANT
TOWEL GREEN STERILE (TOWEL DISPOSABLE) ×2 IMPLANT
TOWEL GREEN STERILE FF (TOWEL DISPOSABLE) ×2 IMPLANT
TRAY FOLEY MTR SLVR 16FR STAT (SET/KITS/TRAYS/PACK) ×2 IMPLANT
WATER STERILE IRR 1000ML POUR (IV SOLUTION) ×2 IMPLANT

## 2021-05-30 NOTE — Progress Notes (Signed)
   Providing Compassionate, Quality Care - Together  NEUROSURGERY PROGRESS NOTE   S: pt s/e in pacu, doing well  O: EXAM:  BP (!) 115/59 (BP Location: Right Arm)   Pulse 81   Temp 97.6 F (36.4 C)   Resp 20   Ht 5' (1.524 m)   Wt 60.8 kg   SpO2 95%   BMI 26.17 kg/m   Awake, alert, oriented  Speech fluent, appropriate  CNs grossly intact  4+/5 BUE/BLE , delt, grip, IO 4+/5 Collar in place Hmv in place  ASSESSMENT:  70 y.o. female with   CSM  S/p PCDF C3-T1 on 05/30/2021  PLAN: - pt -ot - Collar -pain control Dvt ppx     Thank you for allowing me to participate in this patient's care.  Please do not hesitate to call with questions or concerns.   Monia Pouch, DO Neurosurgeon [facility] Cell: [phone]
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
## 2021-05-30 NOTE — Transfer of Care (Signed)
Immediate Anesthesia Transfer of Care Note  Patient: Crystal [name]  Procedure(s) Performed: POSTERIOR CERVICAL DECOMPRESSION Cervical Three-Cervical Six , INSTRUMENTATION AND FUSION Cervical Three-Thoracic One  Patient Location: PACU  Anesthesia Type:General  Level of Consciousness: sedated and drowsy  Airway & Oxygen Therapy: Patient Spontanous Breathing  Post-op Assessment: Report given to RN and Post -op Vital signs reviewed and stable  Post vital signs: Reviewed and stable  Last Vitals:  Vitals Value Taken Time  BP 120/48 05/30/21 1045  Temp    Pulse 72 05/30/21 1046  Resp 39 05/30/21 1046  SpO2 96 % 05/30/21 1046  Vitals shown include unvalidated device data.  Last Pain:  Vitals:   05/30/21 0613  TempSrc:   PainSc: 0-No pain         Complications: No notable events documented.

## 2021-05-30 NOTE — Progress Notes (Signed)
Pharmacy Antibiotic Note  Crystal [name] is a 70 y.o. female admitted on 05/30/2021 or spinal procedure.  Pharmacy has been consulted for vancomycin dosing for surgical prophylaxis.  Received pre-op Ancef 2gm IV at 1815.  Per NSGY note, patient has a medium hemovac.  SCr 0.95, CrCL 75 ml/min  Plan: Vanc 750mg  IV Q24H for goal trough 10-15 mcg/mL Monitor renal fxn, clinical progress, vanc trough if on extended course  Height: 5' (152.4 cm) Weight: 60.8 kg (134 lb) IBW/kg (Calculated) : 45.5  Temp (24hrs), Avg:97.7 F (36.5 C), Min:97.5 F (36.4 C), Max:97.9 F (36.6 C)  No results for input(s): WBC, CREATININE, LATICACIDVEN, VANCOTROUGH, VANCOPEAK, VANCORANDOM, GENTTROUGH, GENTPEAK, GENTRANDOM, TOBRATROUGH, TOBRAPEAK, TOBRARND, AMIKACINPEAK, AMIKACINTROU, AMIKACIN in the last 168 hours.  Estimated Creatinine Clearance: 35.5 mL/min (A) (by C-G formula based on SCr of 1.2 mg/dL (H)).    Allergies  Allergen Reactions   Bactrim [Sulfamethoxazole-Trimethoprim] Nausea And Vomiting   Sulfa Antibiotics Other (See Comments)    unknown   Penicillins Itching, Rash and Other (See Comments)    Unknown     Ancef x 1 9/1 Vanc 9/1 >> Famvir PTA >>  [name] D. 11-05-1982, PharmD, BCPS, BCCCP 05/30/2021, 1:00 PM

## 2021-05-30 NOTE — Anesthesia Procedure Notes (Signed)
Procedure Name: Intubation Date/Time: 05/30/2021 7:52 AM Performed by: Dorthea Cove, CRNA Pre-anesthesia Checklist: Patient identified, Emergency Drugs available, Suction available and Patient being monitored Patient Re-evaluated:Patient Re-evaluated prior to induction Oxygen Delivery Method: Circle system utilized Preoxygenation: Pre-oxygenation with 100% oxygen Induction Type: IV induction Ventilation: Mask ventilation without difficulty and Oral airway inserted - appropriate to patient size Laryngoscope Size: Mac and 3 Grade View: Grade I Tube type: Oral Tube size: 7.0 mm Number of attempts: 1 Airway Equipment and Method: Stylet and Oral airway Placement Confirmation: ETT inserted through vocal cords under direct vision, positive ETCO2 and breath sounds checked- equal and bilateral Secured at: 22 cm Tube secured with: Tape Dental Injury: Teeth and Oropharynx as per pre-operative assessment

## 2021-05-30 NOTE — Op Note (Signed)
Providing Compassionate, Quality Care - Together  Date of service: 05/30/2021  PREOP DIAGNOSIS:  Cervical stenosis, C3-4, C4-5, C5-6, C6-7 with myelopathy Cervical degenerative spondylolisthesis C7-T1  POSTOP DIAGNOSIS: Same  PROCEDURE: Posterior cervical arthrodesis, C3-4, C4-5, C5-6, C6-7, C7-T1 Bilateral lateral mass instrumentation, C3, C4, unilateral C5, bilateral C6, with bilateral T1 pedicle screws, K2M [place] (3.0 x 12 mm bilateral T3, 3.0 x 12 mm bilaterally at C4, 3.0 x 12 mm on the left at C5, 3.0 x 14 mm bilaterally at C6, 4.0 x 24 mm bilateral T1) Posterior cervical laminectomy, bilateral, C3, C4, C5, C6 for decompression of neural elements Intraoperative use of autograft, same incision Intraoperative use of allograft, vesuvius Intraoperative use of microscope for microdissection Intraoperative use of fluoroscopy Intraoperative use of neuro monitoring, SSEPs  SURGEON: Dr. Kendell Bane C. Lennis Rader, DO  ASSISTANT: Dr. Lisbeth Renshaw, MD  SECOND ASSISTANT: Docia Barrier, NP  ANESTHESIA: General Endotracheal  EBL: 50 cc  SPECIMENS: None  DRAINS: Medium Hemovac  COMPLICATIONS: None  CONDITION: Hemodynamically stable  HISTORY: Crystal [name] is a 70 y.o. female that presented to the office with progressive cervical myelopathy symptoms with difficulty walking, falls, progressive numbness and tingling in her upper extremities.  Work-up in the MRI revealed moderate to severe stenosis at C3-4, C4-5, C5-6, C6-7 with a degenerative spondylolisthesis at C7-T1, grade 1.  We discussed all risks, benefits and expected outcomes of surgical intervention and she agreed to proceed with the above surgery.  We also discussed conservative measures however given her progressive symptoms we elected to undergo surgical intervention.  She received medical and cardiac clearance preoperatively.  PROCEDURE IN DETAIL: The patient was brought to the operating room. After induction of general  anesthesia, prepositional SSEPs were obtained and noted to be monitorable.  The patient's head was placed in a Sugita head holder.  The patient was positioned on the operative table in the prone position. All pressure points were meticulously padded. Skin incision was then marked out and prepped and draped in the usual sterile fashion.  Post positional SSEPs were stable.  Physician driven timeout was performed.  Using 10 blade, a midline incision was performed over the C3 to T1 spinous processes.  Sharp dissection was performed down to the posterior cervical fascia.  The posterior cervical fascia was sharply incised bilaterally along the spinous processes.  Using subperiosteal technique, and Bovie electrocautery, the bilateral paraspinal musculature were elevated off the lamina of C3, C4, C5, C6, T1.  Deep retractors were placed in the wound.  Using a high-speed drill, a pilot hole was created in the C3 lateral mass in the superior lateral trajectory.  A 3.74mm tap was used to a depth of 12 mm.  Lateral fluoroscopy confirmed the appropriate level.  At this point the microscope was sterilely draped and brought into the field for the remainder of the procedure.  The C3 lamina was removed with a high-speed drill superiorly above the ligamentum flavum attachment bilaterally.  A bilateral trough was created in the lamina at the lamina facet junction at C4, C5, C6.  Using Kerrison rongeurs and micro curettes, the ligamentum flavum was resected bilaterally and then the spinous processes were gently elevated off the epidural space.  This was cleaned and saved for autograft use later.  It was morselized using a bone mill.  Epidural space was identified and hemostasis was achieved with bipolar cautery and Surgi-Flo and cottonoids.  The remainder of the ligamentum flavum superiorly at C7 was removed with Kerrison rongeurs.  The  spinal cord was clearly decompressed and pulsatile.  Attention was then turned to placement of  instrumentation.  Using a high-speed drill, pilot holes were created in the C4, C5 and C6 lateral masses bilaterally.  Using a 3.65mm tap, superior lateral trajectory was created.  Using a feeler, all borders were felt to be bone.  The above size lateral mass screws were then placed with appropriate bony purchase.  The right C5 lateral mass screw could not be placed due to collision with other tulips at C4 therefore this was skipped.  Attention was then turned to placement of the T1 screws.  The facet laminar junction was localized, as well as the TP junction and a lateral to medial trajectory was created with a pilot hole with a high-speed drill.  Spinal needles were placed in the pilot holes, AP fluoroscopy confirmed appropriate trajectory to the T1 pedicles.  Using a 4.0 mm tap, a lateral to medial trajectory was created.  Using a feeler, all borders were noted to be bony of the trajectory and there was a bony floor.  Length was measured to be 24 mm therefore then bilateral 4.0 millimeter by 24 mm pedicle screws were placed with good bony purchase.  AP fluoroscopy confirmed appropriate placement.  The lateral masses and facet joints were decorticated with a high-speed drill bilaterally at C3-4, C4-5, C5-6, C6-7, C7-T1.  Appropriate sized rods were then measured contoured and placed in the tulips with setscrews.  The setscrews were then final tightened to the manufacturer's recommendation.  Hemostasis was achieved with bipolar cautery.  The wound was copiously irrigated and noted to be excellently hemostatic.  Combination of autograft and allograft was then placed in the lateral gutters bilaterally.  A medium Hemovac was tunneled inferiorly and placed in the epidural space.  Deep retractors were taken out of the wound, hemostasis was achieved with bipolar cautery and Surgi-Flo.  The wound was noted to be excellently hemostatic after period of time of monitoring.  The wound was then closed in layers, 0 Vicryl  sutures for muscle and fascia.  3-0 and 2-0 Vicryl sutures for dermis.  The skin was closed with skin glue and sterile dressing was applied.  Throughout the entirety of the procedure, SSEPs were noted to be stable.  At the end of the case all sponge, needle, and instrument counts were correct. The patient was then transferred to the stretcher, Sugita head holder was removed and she was extubated, and taken to the post-anesthesia care unit in stable hemodynamic condition.

## 2021-05-31 LAB — GLUCOSE, CAPILLARY: Glucose-Capillary: 159 mg/dL — ABNORMAL HIGH (ref 70–99)

## 2021-05-31 MED ORDER — HYDROCODONE-ACETAMINOPHEN 5-325 MG PO TABS: 1.0000 | ORAL_TABLET | ORAL | 0 refills | Status: AC | PRN

## 2021-05-31 MED ORDER — METHOCARBAMOL 750 MG PO TABS: 750.0000 mg | ORAL_TABLET | Freq: Four times a day (QID) | ORAL | 0 refills | Status: DC

## 2021-05-31 NOTE — Evaluation (Signed)
Physical Therapy Evaluation and Discharge Patient Details Name: Crystal [name] MRN: [id number] DOB: 12-28-1950 Today's Date: 05/31/2021   History of Present Illness  Pt is a 70 y/o female s/p ACDF C3-7. PMH including Atrial Septic Disease, COPD, DM, genital herpes, OA, and coronary angioplasty with stent placement.  Clinical Impression  Patient evaluated by Physical Therapy with no further acute PT needs identified. All education has been completed and the patient has no further questions. Pt was able to demonstrate transfers and ambulation with gross supervision for safety and no AD. Occasionally unsteady but able to self correct without assistance. Pt will have adequate supervision at home upon d/c from family. Pt was educated on precautions, brace application/wearing schedule, appropriate activity progression, and car transfer. See below for any follow-up Physical Therapy or equipment needs. PT is signing off. Thank you for this referral.     Follow Up Recommendations No PT follow up;Supervision - Intermittent    Equipment Recommendations  None recommended by PT    Recommendations for Other Services       Precautions / Restrictions Precautions Precautions: Cervical;Fall Precaution Booklet Issued: Yes (comment) Precaution Comments: Reviewed cervical precautions and compensatory techniques for ADLs and functional transfers Required Braces or Orthoses: Cervical Brace Cervical Brace: Hard collar;At all times (Off for showering) Restrictions Weight Bearing Restrictions: No      Mobility  Bed Mobility Overal bed mobility: Needs Assistance Bed Mobility: Rolling;Sidelying to Sit;Sit to Sidelying Rolling: Supervision Sidelying to sit: Supervision     Sit to sidelying: Supervision General bed mobility comments: Pt was received standing in the room and was returned to EOB. Verbal review of log roll technique.    Transfers Overall transfer level: Needs assistance Equipment used:  None Transfers: Sit to/from Stand Sit to Stand: Supervision         General transfer comment: Supervision for general safety. VC's for improved posture with power up to full stand.  Ambulation/Gait Ambulation/Gait assistance: Supervision Gait Distance (Feet): 400 Feet Assistive device: None Gait Pattern/deviations: Step-through pattern;Decreased stride length;Trunk flexed Gait velocity: Decreased Gait velocity interpretation: <1.31 ft/sec, indicative of household ambulator General Gait Details: VC's for improved posture. Occasional unsteadiness noted however pt did not have any overt LOB. Reaching out for external support occasionally.  Stairs Stairs: Yes Stairs assistance: Supervision;Min guard Stair Management: One rail Left;Forwards;Alternating pattern Number of Stairs: 4 General stair comments: VC's for general safety. Min guard progressing to supervision by end of session.  Wheelchair Mobility    Modified Rankin (Stroke Patients Only)       Balance Overall balance assessment: Mild deficits observed, not formally tested                                           Pertinent Vitals/Pain Pain Assessment: Faces Faces Pain Scale: Hurts a little bit Pain Descriptors / Indicators: Constant;Discomfort Pain Intervention(s): Limited activity within patient's tolerance;Monitored during session;Repositioned    Home Living Family/patient expects to be discharged to:: Private residence Living Arrangements: Children Available Help at Discharge: Family;Available PRN/intermittently Type of Home: House Home Access: Stairs to enter   Entrance Stairs-Number of Steps: 4 Home Layout: Two level Home Equipment: Walker - 2 wheels;Cane - single point;Shower seat Additional Comments: Above information for daughter's home. Plans to dc to her daughter's home - then transition to her son's    Prior Function Level of Independence: Independent  Comments:  Children assist with IADLs     Hand Dominance   Dominant Hand: Right    Extremity/Trunk Assessment   Upper Extremity Assessment Upper Extremity Assessment: Defer to OT evaluation RUE Deficits / Details: Numbness at finger tips. Decreased pinch strength. Difficult snapping bra clips; requiring increased time and effort but able to perform without physical A RUE Sensation: decreased light touch LUE Deficits / Details: Numbness at finger tips. Decreased pinch strength. Difficult snapping bra clips; requiring increased time and effort but able to perform without physical A LUE Sensation: decreased light touch    Lower Extremity Assessment Lower Extremity Assessment: Generalized weakness (Consistent with pre-op diagnosis.)    Cervical / Trunk Assessment Cervical / Trunk Assessment: Other exceptions Cervical / Trunk Exceptions: s/p cervical sx  Communication   Communication: No difficulties  Cognition Arousal/Alertness: Awake/alert Behavior During Therapy: WFL for tasks assessed/performed Overall Cognitive Status: Within Functional Limits for tasks assessed                                        General Comments General comments (skin integrity, edema, etc.): BP sitting at EOB after mobility to bathroom: 179/72 (96). BP supine at end of session 174/62 (90)    Exercises     Assessment/Plan    PT Assessment Patent does not need any further PT services  PT Problem List         PT Treatment Interventions      PT Goals (Current goals can be found in the Care Plan section)  Acute Rehab PT Goals Patient Stated Goal: Be able to rest and not have to walk all the time to get pain relief PT Goal Formulation: All assessment and education complete, DC therapy    Frequency     Barriers to discharge        Co-evaluation               AM-PAC PT "6 Clicks" Mobility  Outcome Measure Help needed turning from your back to your side while in a flat bed without  using bedrails?: None Help needed moving from lying on your back to sitting on the side of a flat bed without using bedrails?: A Little Help needed moving to and from a bed to a chair (including a wheelchair)?: A Little Help needed standing up from a chair using your arms (e.g., wheelchair or bedside chair)?: A Little Help needed to walk in hospital room?: A Little Help needed climbing 3-5 steps with a railing? : A Little 6 Click Score: 19    End of Session Equipment Utilized During Treatment: Cervical collar Activity Tolerance: Patient tolerated treatment well Patient left: in bed;with call bell/phone within reach (Sitting EOB eating a snack) Nurse Communication: Mobility status PT Visit Diagnosis: Unsteadiness on feet (R26.81);Pain Pain - part of body:  (back)    Time: 2683-4196 PT Time Calculation (min) (ACUTE ONLY): 17 min   Charges:   PT Evaluation $PT Eval Low Complexity: 1 Low          Conni Slipper, PT, DPT Acute Rehabilitation Services Pager: [phone] Office: [phone]   Marylynn Pearson 05/31/2021, 10:23 AM

## 2021-05-31 NOTE — Evaluation (Signed)
Occupational Therapy Evaluation Patient Details Name: Crystal [name] MRN: [id number] DOB: 09-27-1951 Today's Date: 05/31/2021    History of Present Illness  70 yo female s/p ACDF C3-7. PMH including ASCVD, ASD, COPD, DM, genital herpes, OA, and coronary angioplasty with stent placement.   Clinical Impression   PTA, pt was independent with ADLs and plans on discharging to her daughter's home. Currently, pt requires Supervision for ADLs and functional mobility. Provided education and handout on cervical precautions, grooming, collar management, bed mobility, UB ADLs, LB ADLs, toileting, and tub transfer; pt demonstrated and verbalized understanding. Answered all pt questions. Recommend dc home once medically stable per physician. All acute OT needs met and will sign off. Thank you.    Follow Up Recommendations  No OT follow up;Supervision/Assistance - 24 hour    Equipment Recommendations  None recommended by OT    Recommendations for Other Services PT consult     Precautions / Restrictions Precautions Precautions: Cervical Precaution Booklet Issued: Yes (comment) Precaution Comments: Reviewed cervical precautions and compensatory techniques for ADLs and functional transfers Required Braces or Orthoses: Cervical Brace Cervical Brace: Hard collar;At all times (Off for showering) Restrictions Weight Bearing Restrictions: No      Mobility Bed Mobility Overal bed mobility: Needs Assistance Bed Mobility: Rolling;Sidelying to Sit;Sit to Sidelying Rolling: Supervision Sidelying to sit: Supervision     Sit to sidelying: Supervision General bed mobility comments: Providing education on log roll technique    Transfers Overall transfer level: Needs assistance Equipment used: None Transfers: Sit to/from Stand Sit to Stand: Supervision         General transfer comment: supervision for safety    Balance Overall balance assessment: Mild deficits observed, not formally tested                                          ADL either performed or assessed with clinical judgement   ADL Overall ADL's : Needs assistance/impaired                                       General ADL Comments: Pt performing ADLs and functional mobility with Supervision. Providing education on cervical precautions, collar management, UB ADLs, LB ADLs, grooming, toileting, and tub transfer. Pt reporting dizziness with activity and noting BP elevated; notified RN.     Vision         Perception     Praxis      Pertinent Vitals/Pain Pain Assessment: Faces Faces Pain Scale: Hurts a little bit Pain Descriptors / Indicators: Constant;Discomfort Pain Intervention(s): Monitored during session     Hand Dominance Right   Extremity/Trunk Assessment Upper Extremity Assessment Upper Extremity Assessment: RUE deficits/detail;LUE deficits/detail RUE Deficits / Details: Numbness at finger tips. Decreased pinch strength. Difficult snapping bra clips; requiring increased time and effort but able to perform without physical A RUE Sensation: decreased light touch LUE Deficits / Details: Numbness at finger tips. Decreased pinch strength. Difficult snapping bra clips; requiring increased time and effort but able to perform without physical A LUE Sensation: decreased light touch   Lower Extremity Assessment Lower Extremity Assessment: Defer to PT evaluation   Cervical / Trunk Assessment Cervical / Trunk Assessment: Other exceptions Cervical / Trunk Exceptions: s/p cervical sx   Communication Communication Communication: No difficulties   Cognition Arousal/Alertness: Awake/alert  Behavior During Therapy: WFL for tasks assessed/performed Overall Cognitive Status: Within Functional Limits for tasks assessed                                     General Comments  BP sitting at EOB after mobility to bathroom: 179/72 (96). BP supine at end of session 174/62  (90)    Exercises     Shoulder Instructions      Home Living Family/patient expects to be discharged to:: Private residence Living Arrangements: Children Available Help at Discharge: Family;Available PRN/intermittently Type of Home: House Home Access: Stairs to enter Entrance Stairs-Number of Steps: 4   Home Layout: Two level     Bathroom Shower/Tub: Tub/shower unit (walk-in upstairs)   Bathroom Toilet: Standard     Home Equipment: Environmental consultant - 2 wheels;Cane - single point;Shower seat   Additional Comments: Above information for daughter's home. Plans to dc to her daughter's home - then transition to her son's      Prior Functioning/Environment Level of Independence: Independent        Comments: Children assist with IADLs        OT Problem List: Decreased range of motion;Impaired balance (sitting and/or standing);Decreased knowledge of use of DME or AE;Decreased knowledge of precautions      OT Treatment/Interventions:      OT Goals(Current goals can be found in the care plan section) Acute Rehab OT Goals Patient Stated Goal: go home OT Goal Formulation: All assessment and education complete, DC therapy  OT Frequency:     Barriers to D/C:            Co-evaluation              AM-PAC OT "6 Clicks" Daily Activity     Outcome Measure Help from another person eating meals?: None Help from another person taking care of personal grooming?: A Little Help from another person toileting, which includes using toliet, bedpan, or urinal?: A Little Help from another person bathing (including washing, rinsing, drying)?: A Little Help from another person to put on and taking off regular upper body clothing?: A Little Help from another person to put on and taking off regular lower body clothing?: A Little 6 Click Score: 19   End of Session Equipment Utilized During Treatment: Cervical collar Nurse Communication: Mobility status  Activity Tolerance: Patient  tolerated treatment well Patient left: in bed;with call bell/phone within reach  OT Visit Diagnosis: Unsteadiness on feet (R26.81);Other abnormalities of gait and mobility (R26.89);Muscle weakness (generalized) (M62.81)                Time: 7106-2694 OT Time Calculation (min): 26 min Charges:  OT General Charges $OT Visit: 1 Visit OT Evaluation $OT Eval Low Complexity: 1 Low OT Treatments $Self Care/Home Management : 8-22 mins  Angline Schweigert MSOT, OTR/L Acute Rehab Pager: [phone] Office: [place] 05/31/2021, 9:43 AM

## 2021-05-31 NOTE — Discharge Instructions (Signed)
Wound Care °Leave incision open to air. °You may shower. °Do not scrub directly on incision.  °Do not put any creams, lotions, or ointments on incision. °Activity °Walk each and every day, increasing distance each day. °No lifting greater than 5 lbs.  Avoid excessive neck motion. °No driving for 2 weeks; may ride as a passenger locally. °Wear neck brace at all times except when showering.  If provided soft collar, may wear for comfort unless otherwise instructed. °Diet °Resume your normal diet.  °Return to Work °Will be discussed at you follow up appointment. °Call Your Doctor If Any of These Occur °Redness, drainage, or swelling at the wound.  °Temperature greater than 101 degrees. °Severe pain not relieved by pain medication. °Increased difficulty swallowing. °Incision starts to come apart. °Follow Up Appt °Call today for appointment in 2 weeks ([phone]) or for problems.  If you have any hardware placed in your spine, you will need an x-ray before your appointment.  °

## 2021-05-31 NOTE — Telephone Encounter (Signed)
Pt has never returned phone call to discuss her HR.  She has since been in the hospital for surgery where her HR ran in the 80s and 90s.

## 2021-05-31 NOTE — Progress Notes (Signed)
Subjective: Patient reports that she is doing well and is pleased with her postoperative status thus far. She has appropriate upper back/neck pain. BUE paraesthesias and ambulation have improved. No acute events overnight.   Objective: Vital signs in last 24 hours: Temp:  [97.5 F (36.4 C)-98 F (36.7 C)] 97.7 F (36.5 C) (09/02 0718) Pulse Rate:  [73-82] 75 (09/02 0718) Resp:  [15-20] 18 (09/02 0718) BP: (115-175)/(48-71) 173/64 (09/02 0718) SpO2:  [94 %-99 %] 97 % (09/02 0718)  Intake/Output from previous day: 09/01 0701 - 09/02 0700 In: 1400 [I.V.:1300; IV Piggyback:100] Out: 490 [Urine:180; Drains:210; Blood:100] Intake/Output this shift: No intake/output data recorded.  Physical Exam: Patient is awake, A/O X 4, conversant, and in good spirits. She is in NAD, VSS. Doing well. Speech is fluent and appropriate. MAEW. 4+/5 BUE/BLE , delt, grip, IO 4+/5. Dressing is clean dry intact. Incision is well approximated with no drainage, erythema, or edema. Drain has been removed. Hard cervical collar in place   Lab Results: No results for input(s): WBC, HGB, HCT, PLT in the last 72 hours. BMET No results for input(s): NA, K, CL, CO2, GLUCOSE, BUN, CREATININE, CALCIUM in the last 72 hours.  Studies/Results: DG Cervical Spine 2 or 3 views  Result Date: 05/30/2021 CLINICAL DATA:  Posterior cervical spine decompression and fusion C3-C6 EXAM: CERVICAL SPINE - 2-3 VIEW COMPARISON:  MRI cervical spine 12/25/2020 FLUOROSCOPY TIME:  0 minutes 12.6 seconds Dose: 2.20 mGy Images: 3 FINDINGS: Pedicle screws are seen at multiple levels of the cervical spine, C3-C6. Additional pedicle screws at T1. Prior median sternotomy. Bones demineralized with scattered degenerative disc disease changes. IMPRESSION: Intraoperative images during cervical spine fusion. Electronically Signed   By: Ulyses Southward M.D.   On: 05/30/2021 13:42   DG C-Arm 1-60 Min-No Report  Result Date: 05/30/2021 Fluoroscopy was utilized by  the requesting physician.  No radiographic interpretation.    Assessment/Plan: Patient is post-op day 1 s/p PCDF C3-T. She is recovering well and reports a significant improvement in her preoperative symptoms.  Her only complaint is mild incisional, shoulder and upper back discomfort.  She has ambulated with nursing staff and is awaiting physical and occupational therapy evaluation.  Continue hard cervical collar. Continue working on pain control, mobility and ambulating patient. Will plan for discharge today.    LOS: 1 day     Council Mechanic, DNP, NP-C 05/31/2021, 8:30 AM

## 2021-05-31 NOTE — Anesthesia Postprocedure Evaluation (Signed)
Anesthesia Post Note  Patient: Crystal [name]  Procedure(s) Performed: POSTERIOR CERVICAL DECOMPRESSION Cervical Three-Cervical Six , INSTRUMENTATION AND FUSION Cervical Three-Thoracic One     Patient location during evaluation: PACU Anesthesia Type: General Level of consciousness: awake and alert Pain management: pain level controlled Vital Signs Assessment: post-procedure vital signs reviewed and stable Respiratory status: spontaneous breathing, nonlabored ventilation, respiratory function stable and patient connected to nasal cannula oxygen Cardiovascular status: blood pressure returned to baseline and stable Postop Assessment: no apparent nausea or vomiting Anesthetic complications: no   No notable events documented.  Last Vitals:  Vitals:   05/31/21 0359 05/31/21 0718  BP: (!) 150/70 (!) 173/64  Pulse: 82 75  Resp: 18 18  Temp: 36.5 C 36.5 C  SpO2: 99% 97%    Last Pain:  Vitals:   05/31/21 0718  TempSrc: Oral  PainSc: 3                  Kevon Tench

## 2021-05-31 NOTE — Discharge Summary (Signed)
Physician Discharge Summary  Patient ID: Crystal [name] MRN: [id number] DOB/AGE: 70/14/1952 70 y.o.  Admit date: 05/30/2021 Discharge date: 05/31/2021  Admission Diagnoses: Cervical stenosis, C3-4, C4-5, C5-6, C6-7 with myelopathy, Cervical degenerative spondylolisthesis C7-T1  Discharge Diagnoses: Cervical stenosis, C3-4, C4-5, C5-6, C6-7 with myelopathy, Cervical degenerative spondylolisthesis C7-T1 Active Problems:   Cervical myelopathy [facility])   Discharged Condition: good  Hospital Course: The patient was admitted on 05/30/2021 and taken to the operating room where the patient underwent PCDF C3-T. The patient tolerated the procedure well and was taken to the recovery room and then to the floor in stable condition. The hospital course was routine. There were no complications. The wound remained clean dry and intact. Pt had appropriate upper back/neck soreness. No complaints of arm pain or new N/T/W. The patient remained afebrile with stable vital signs, and tolerated a regular diet. The patient continued to increase activities, and pain was well controlled with oral pain medications.   Consults: None  Significant Diagnostic Studies: radiology: X-Ray: intraoperative   Treatments: surgery:   1. Posterior cervical arthrodesis, C3-4, C4-5, C5-6, C6-7, C7-T1 2. Bilateral lateral mass instrumentation, C3, C4, unilateral C5, bilateral C6, with bilateral T1 pedicle screws, K2M [place] (3.0 x 12 mm bilateral T3, 3.0 x 12 mm   bilaterally at C4, 3.0 x 12 mm on the left at C5, 3.0 x 14 mm bilaterally at C6, 4.0 x 24 mm bilateral T1) 3. Posterior cervical laminectomy, bilateral, C3, C4, C5, C6 for decompression of neural elements 4. Intraoperative use of autograft, same incision 5. Intraoperative use of allograft, vesuvius 6. Intraoperative use of microscope for microdissection 7. Intraoperative use of fluoroscopy 8. Intraoperative use of neuro monitoring, SSEPs  Discharge Exam: Blood pressure (!)  173/64, pulse 75, temperature 97.7 F (36.5 C), temperature source Oral, resp. rate 18, height 5' (1.524 m), weight 60.8 kg, SpO2 97 %.  Physical Exam: Patient is awake, A/O X 4, conversant, and in good spirits. She is in NAD, VSS. Doing well. Speech is fluent and appropriate. MAEW. 4+/5 BUE/BLE , delt, grip, IO 4+/5. Dressing is clean dry intact. Incision is well approximated with no drainage, erythema, or edema. Hard cervical collar in place  Disposition: Discharge disposition: 01-Home or Self Care       Discharge Instructions     Incentive spirometry RT   Complete by: As directed       Allergies as of 05/31/2021       Reactions   Bactrim [sulfamethoxazole-trimethoprim] Nausea And Vomiting   Sulfa Antibiotics Other (See Comments)   unknown   Penicillins Itching, Rash, Other (See Comments)   Unknown        Medication List     STOP taking these medications    tiZANidine 2 MG tablet Commonly known as: ZANAFLEX       TAKE these medications    albuterol 108 (90 Base) MCG/ACT inhaler Commonly known as: VENTOLIN HFA Inhale 2 puffs into the lungs every 6 (six) hours as needed for wheezing.   aspirin 81 MG tablet Take 81 mg by mouth daily.   atorvastatin 80 MG tablet Commonly known as: LIPITOR Take 1 tablet (80 mg total) by mouth daily.   esomeprazole 40 MG capsule Commonly known as: NEXIUM Take 40 mg by mouth every morning.   Eucrisa 2 % Oint Generic drug: Crisaborole Apply 1 tablet topically daily as needed (eczema).   famciclovir 250 MG tablet Commonly known as: FAMVIR Take 250 mg by mouth 2 (two) times daily.  HYDROcodone-acetaminophen 5-325 MG tablet Commonly known as: NORCO/VICODIN Take 1-2 tablets by mouth every 4 (four) hours as needed for moderate pain.   isosorbide mononitrate 60 MG 24 hr tablet Commonly known as: IMDUR Take 1 tablet (60 mg total) by mouth daily.   methocarbamol 750 MG tablet Commonly known as: Robaxin-750 Take 1 tablet  (750 mg total) by mouth 4 (four) times daily.   metoprolol succinate 50 MG 24 hr tablet Commonly known as: TOPROL-XL Take 1 tablet (50 mg total) by mouth daily. Take with or immediately following a meal.   nystatin cream Commonly known as: MYCOSTATIN Apply 1 application topically 3 (three) times daily as needed (outbreaks).   risperiDONE 0.5 MG tablet Commonly known as: RISPERDAL Take 0.5 mg by mouth at bedtime.   Trelegy Ellipta 100-62.5-25 MCG/INH Aepb Generic drug: Fluticasone-Umeclidin-Vilant Inhale 1 puff into the lungs daily.   triamcinolone cream 0.1 % Commonly known as: KENALOG Apply 1 application topically 3 (three) times daily as needed (outbreaks).   vitamin B-12 1000 MCG tablet Commonly known as: CYANOCOBALAMIN Take 1,000 mcg by mouth in the morning and at bedtime.         Signed: Council Mechanic, DNP, NP-C 05/31/2021, 8:40 AM

## 2021-05-31 NOTE — Progress Notes (Signed)
Patient was transported via wheelchair by NT for discharge home; in no acute distress nor complaints of pain nor discomfort; incision on the posterior neck is clean, dry and intact with Aspen collar brace on; room was checked and accounted for all her belongings; discharge instructions given to patient by RN and she verbalized understanding on the instructions given.

## 2021-06-02 ENCOUNTER — Encounter (HOSPITAL_COMMUNITY): Payer: Self-pay | Admitting: Neurological Surgery

## 2021-07-11 DIAGNOSIS — M545 Low back pain, unspecified: Secondary | ICD-10-CM | POA: Diagnosis not present

## 2021-07-11 DIAGNOSIS — N183 Chronic kidney disease, stage 3 unspecified: Secondary | ICD-10-CM | POA: Diagnosis not present

## 2021-07-11 DIAGNOSIS — J449 Chronic obstructive pulmonary disease, unspecified: Secondary | ICD-10-CM | POA: Diagnosis not present

## 2021-07-11 DIAGNOSIS — Z9181 History of falling: Secondary | ICD-10-CM | POA: Diagnosis not present

## 2021-07-11 DIAGNOSIS — M4802 Spinal stenosis, cervical region: Secondary | ICD-10-CM | POA: Diagnosis not present

## 2021-07-11 DIAGNOSIS — R252 Cramp and spasm: Secondary | ICD-10-CM | POA: Diagnosis not present

## 2021-07-11 DIAGNOSIS — K219 Gastro-esophageal reflux disease without esophagitis: Secondary | ICD-10-CM | POA: Diagnosis not present

## 2021-07-11 DIAGNOSIS — Z23 Encounter for immunization: Secondary | ICD-10-CM | POA: Diagnosis not present

## 2021-07-11 DIAGNOSIS — E119 Type 2 diabetes mellitus without complications: Secondary | ICD-10-CM | POA: Diagnosis not present

## 2021-07-11 DIAGNOSIS — I259 Chronic ischemic heart disease, unspecified: Secondary | ICD-10-CM | POA: Diagnosis not present

## 2021-07-28 DIAGNOSIS — Z7902 Long term (current) use of antithrombotics/antiplatelets: Secondary | ICD-10-CM | POA: Diagnosis not present

## 2021-07-28 DIAGNOSIS — R42 Dizziness and giddiness: Secondary | ICD-10-CM | POA: Diagnosis not present

## 2021-07-28 DIAGNOSIS — Z882 Allergy status to sulfonamides status: Secondary | ICD-10-CM | POA: Diagnosis not present

## 2021-07-28 DIAGNOSIS — Z7982 Long term (current) use of aspirin: Secondary | ICD-10-CM | POA: Diagnosis not present

## 2021-07-28 DIAGNOSIS — H538 Other visual disturbances: Secondary | ICD-10-CM | POA: Diagnosis not present

## 2021-07-28 DIAGNOSIS — R29702 NIHSS score 2: Secondary | ICD-10-CM | POA: Diagnosis not present

## 2021-07-28 DIAGNOSIS — E785 Hyperlipidemia, unspecified: Secondary | ICD-10-CM | POA: Diagnosis not present

## 2021-07-28 DIAGNOSIS — Z880 Allergy status to penicillin: Secondary | ICD-10-CM | POA: Diagnosis not present

## 2021-07-28 DIAGNOSIS — I639 Cerebral infarction, unspecified: Secondary | ICD-10-CM | POA: Diagnosis not present

## 2021-07-28 DIAGNOSIS — I63532 Cerebral infarction due to unspecified occlusion or stenosis of left posterior cerebral artery: Secondary | ICD-10-CM | POA: Diagnosis not present

## 2021-07-28 DIAGNOSIS — R200 Anesthesia of skin: Secondary | ICD-10-CM | POA: Diagnosis not present

## 2021-07-28 DIAGNOSIS — Z79899 Other long term (current) drug therapy: Secondary | ICD-10-CM | POA: Diagnosis not present

## 2021-07-28 DIAGNOSIS — Z87891 Personal history of nicotine dependence: Secondary | ICD-10-CM | POA: Diagnosis not present

## 2021-07-28 DIAGNOSIS — I2510 Atherosclerotic heart disease of native coronary artery without angina pectoris: Secondary | ICD-10-CM | POA: Diagnosis not present

## 2021-07-28 DIAGNOSIS — E1151 Type 2 diabetes mellitus with diabetic peripheral angiopathy without gangrene: Secondary | ICD-10-CM | POA: Diagnosis not present

## 2021-07-28 DIAGNOSIS — H53461 Homonymous bilateral field defects, right side: Secondary | ICD-10-CM | POA: Diagnosis not present

## 2021-07-28 DIAGNOSIS — J449 Chronic obstructive pulmonary disease, unspecified: Secondary | ICD-10-CM | POA: Diagnosis not present

## 2021-07-28 DIAGNOSIS — I10 Essential (primary) hypertension: Secondary | ICD-10-CM | POA: Diagnosis not present

## 2021-07-30 DIAGNOSIS — I63532 Cerebral infarction due to unspecified occlusion or stenosis of left posterior cerebral artery: Secondary | ICD-10-CM | POA: Diagnosis not present

## 2021-07-31 DIAGNOSIS — G959 Disease of spinal cord, unspecified: Secondary | ICD-10-CM | POA: Diagnosis not present

## 2021-08-07 DIAGNOSIS — Z8673 Personal history of transient ischemic attack (TIA), and cerebral infarction without residual deficits: Secondary | ICD-10-CM | POA: Diagnosis not present

## 2021-08-07 DIAGNOSIS — E7800 Pure hypercholesterolemia, unspecified: Secondary | ICD-10-CM | POA: Diagnosis not present

## 2021-08-07 DIAGNOSIS — Z79899 Other long term (current) drug therapy: Secondary | ICD-10-CM | POA: Diagnosis not present

## 2021-08-07 DIAGNOSIS — I10 Essential (primary) hypertension: Secondary | ICD-10-CM | POA: Diagnosis not present

## 2021-08-07 DIAGNOSIS — H53461 Homonymous bilateral field defects, right side: Secondary | ICD-10-CM | POA: Diagnosis not present

## 2021-08-07 DIAGNOSIS — Z87898 Personal history of other specified conditions: Secondary | ICD-10-CM | POA: Diagnosis not present

## 2021-08-07 DIAGNOSIS — E119 Type 2 diabetes mellitus without complications: Secondary | ICD-10-CM | POA: Diagnosis not present

## 2021-08-07 DIAGNOSIS — I259 Chronic ischemic heart disease, unspecified: Secondary | ICD-10-CM | POA: Diagnosis not present

## 2021-08-07 DIAGNOSIS — Z6823 Body mass index (BMI) 23.0-23.9, adult: Secondary | ICD-10-CM | POA: Diagnosis not present

## 2021-08-14 DIAGNOSIS — I3481 Nonrheumatic mitral (valve) annulus calcification: Secondary | ICD-10-CM | POA: Diagnosis not present

## 2021-08-14 DIAGNOSIS — I351 Nonrheumatic aortic (valve) insufficiency: Secondary | ICD-10-CM | POA: Diagnosis not present

## 2021-08-14 DIAGNOSIS — Z8673 Personal history of transient ischemic attack (TIA), and cerebral infarction without residual deficits: Secondary | ICD-10-CM | POA: Diagnosis not present

## 2021-08-14 DIAGNOSIS — I081 Rheumatic disorders of both mitral and tricuspid valves: Secondary | ICD-10-CM | POA: Diagnosis not present

## 2021-08-14 DIAGNOSIS — H31093 Other chorioretinal scars, bilateral: Secondary | ICD-10-CM | POA: Diagnosis not present

## 2021-08-14 DIAGNOSIS — H539 Unspecified visual disturbance: Secondary | ICD-10-CM | POA: Diagnosis not present

## 2021-08-14 DIAGNOSIS — I69398 Other sequelae of cerebral infarction: Secondary | ICD-10-CM | POA: Diagnosis not present

## 2021-08-25 DIAGNOSIS — Z951 Presence of aortocoronary bypass graft: Secondary | ICD-10-CM | POA: Diagnosis not present

## 2021-08-25 DIAGNOSIS — Z7902 Long term (current) use of antithrombotics/antiplatelets: Secondary | ICD-10-CM | POA: Diagnosis not present

## 2021-08-25 DIAGNOSIS — R791 Abnormal coagulation profile: Secondary | ICD-10-CM | POA: Diagnosis not present

## 2021-08-25 DIAGNOSIS — I69393 Ataxia following cerebral infarction: Secondary | ICD-10-CM | POA: Diagnosis not present

## 2021-08-25 DIAGNOSIS — I10 Essential (primary) hypertension: Secondary | ICD-10-CM | POA: Diagnosis not present

## 2021-08-25 DIAGNOSIS — Z882 Allergy status to sulfonamides status: Secondary | ICD-10-CM | POA: Diagnosis not present

## 2021-08-25 DIAGNOSIS — Z880 Allergy status to penicillin: Secondary | ICD-10-CM | POA: Diagnosis not present

## 2021-08-25 DIAGNOSIS — Z7982 Long term (current) use of aspirin: Secondary | ICD-10-CM | POA: Diagnosis not present

## 2021-08-25 DIAGNOSIS — I63233 Cerebral infarction due to unspecified occlusion or stenosis of bilateral carotid arteries: Secondary | ICD-10-CM | POA: Diagnosis not present

## 2021-08-25 DIAGNOSIS — R0602 Shortness of breath: Secondary | ICD-10-CM | POA: Diagnosis not present

## 2021-08-25 DIAGNOSIS — E119 Type 2 diabetes mellitus without complications: Secondary | ICD-10-CM | POA: Diagnosis not present

## 2021-08-25 DIAGNOSIS — I2510 Atherosclerotic heart disease of native coronary artery without angina pectoris: Secondary | ICD-10-CM | POA: Diagnosis not present

## 2021-08-25 DIAGNOSIS — Z86718 Personal history of other venous thrombosis and embolism: Secondary | ICD-10-CM | POA: Diagnosis not present

## 2021-08-25 DIAGNOSIS — J984 Other disorders of lung: Secondary | ICD-10-CM | POA: Diagnosis not present

## 2021-08-25 DIAGNOSIS — R42 Dizziness and giddiness: Secondary | ICD-10-CM | POA: Diagnosis not present

## 2021-08-25 DIAGNOSIS — R270 Ataxia, unspecified: Secondary | ICD-10-CM | POA: Diagnosis not present

## 2021-08-25 DIAGNOSIS — I69993 Ataxia following unspecified cerebrovascular disease: Secondary | ICD-10-CM | POA: Diagnosis not present

## 2021-08-28 DIAGNOSIS — R42 Dizziness and giddiness: Secondary | ICD-10-CM | POA: Diagnosis not present

## 2021-10-07 DIAGNOSIS — M47812 Spondylosis without myelopathy or radiculopathy, cervical region: Secondary | ICD-10-CM | POA: Diagnosis not present

## 2021-10-07 DIAGNOSIS — G959 Disease of spinal cord, unspecified: Secondary | ICD-10-CM | POA: Diagnosis not present

## 2021-10-11 DIAGNOSIS — M4802 Spinal stenosis, cervical region: Secondary | ICD-10-CM | POA: Diagnosis not present

## 2021-10-11 DIAGNOSIS — I259 Chronic ischemic heart disease, unspecified: Secondary | ICD-10-CM | POA: Diagnosis not present

## 2021-10-11 DIAGNOSIS — R252 Cramp and spasm: Secondary | ICD-10-CM | POA: Diagnosis not present

## 2021-10-11 DIAGNOSIS — E119 Type 2 diabetes mellitus without complications: Secondary | ICD-10-CM | POA: Diagnosis not present

## 2021-10-11 DIAGNOSIS — Z8673 Personal history of transient ischemic attack (TIA), and cerebral infarction without residual deficits: Secondary | ICD-10-CM | POA: Diagnosis not present

## 2021-10-11 DIAGNOSIS — N183 Chronic kidney disease, stage 3 unspecified: Secondary | ICD-10-CM | POA: Diagnosis not present

## 2021-10-11 DIAGNOSIS — J449 Chronic obstructive pulmonary disease, unspecified: Secondary | ICD-10-CM | POA: Diagnosis not present

## 2021-10-11 DIAGNOSIS — K219 Gastro-esophageal reflux disease without esophagitis: Secondary | ICD-10-CM | POA: Diagnosis not present

## 2021-10-11 DIAGNOSIS — Q211 Atrial septal defect, unspecified: Secondary | ICD-10-CM | POA: Diagnosis not present

## 2021-10-11 DIAGNOSIS — I10 Essential (primary) hypertension: Secondary | ICD-10-CM | POA: Diagnosis not present

## 2021-12-09 DIAGNOSIS — Z0000 Encounter for general adult medical examination without abnormal findings: Secondary | ICD-10-CM | POA: Diagnosis not present

## 2021-12-09 DIAGNOSIS — E785 Hyperlipidemia, unspecified: Secondary | ICD-10-CM | POA: Diagnosis not present

## 2021-12-09 DIAGNOSIS — Z9181 History of falling: Secondary | ICD-10-CM | POA: Diagnosis not present

## 2021-12-27 DIAGNOSIS — E119 Type 2 diabetes mellitus without complications: Secondary | ICD-10-CM | POA: Diagnosis not present

## 2021-12-27 DIAGNOSIS — N183 Chronic kidney disease, stage 3 unspecified: Secondary | ICD-10-CM | POA: Diagnosis not present

## 2021-12-27 DIAGNOSIS — J449 Chronic obstructive pulmonary disease, unspecified: Secondary | ICD-10-CM | POA: Diagnosis not present

## 2022-01-17 DIAGNOSIS — H6121 Impacted cerumen, right ear: Secondary | ICD-10-CM | POA: Diagnosis not present

## 2022-01-17 DIAGNOSIS — J019 Acute sinusitis, unspecified: Secondary | ICD-10-CM | POA: Diagnosis not present

## 2022-01-17 DIAGNOSIS — I259 Chronic ischemic heart disease, unspecified: Secondary | ICD-10-CM | POA: Diagnosis not present

## 2022-01-17 DIAGNOSIS — I10 Essential (primary) hypertension: Secondary | ICD-10-CM | POA: Diagnosis not present

## 2022-01-17 DIAGNOSIS — J449 Chronic obstructive pulmonary disease, unspecified: Secondary | ICD-10-CM | POA: Diagnosis not present

## 2022-01-17 DIAGNOSIS — E119 Type 2 diabetes mellitus without complications: Secondary | ICD-10-CM | POA: Diagnosis not present

## 2022-01-17 DIAGNOSIS — N183 Chronic kidney disease, stage 3 unspecified: Secondary | ICD-10-CM | POA: Diagnosis not present

## 2022-01-17 DIAGNOSIS — K219 Gastro-esophageal reflux disease without esophagitis: Secondary | ICD-10-CM | POA: Diagnosis not present

## 2022-01-17 DIAGNOSIS — R42 Dizziness and giddiness: Secondary | ICD-10-CM | POA: Diagnosis not present

## 2022-01-17 DIAGNOSIS — Z8673 Personal history of transient ischemic attack (TIA), and cerebral infarction without residual deficits: Secondary | ICD-10-CM | POA: Diagnosis not present

## 2022-02-21 DIAGNOSIS — Z139 Encounter for screening, unspecified: Secondary | ICD-10-CM | POA: Diagnosis not present

## 2022-02-21 DIAGNOSIS — N183 Chronic kidney disease, stage 3 unspecified: Secondary | ICD-10-CM | POA: Diagnosis not present

## 2022-02-21 DIAGNOSIS — I259 Chronic ischemic heart disease, unspecified: Secondary | ICD-10-CM | POA: Diagnosis not present

## 2022-03-06 DIAGNOSIS — I259 Chronic ischemic heart disease, unspecified: Secondary | ICD-10-CM | POA: Diagnosis not present

## 2022-03-06 DIAGNOSIS — R5383 Other fatigue: Secondary | ICD-10-CM | POA: Diagnosis not present

## 2022-03-06 DIAGNOSIS — N183 Chronic kidney disease, stage 3 unspecified: Secondary | ICD-10-CM | POA: Diagnosis not present

## 2022-04-04 DIAGNOSIS — E119 Type 2 diabetes mellitus without complications: Secondary | ICD-10-CM | POA: Diagnosis not present

## 2022-04-04 DIAGNOSIS — I259 Chronic ischemic heart disease, unspecified: Secondary | ICD-10-CM | POA: Diagnosis not present

## 2022-04-04 DIAGNOSIS — N183 Chronic kidney disease, stage 3 unspecified: Secondary | ICD-10-CM | POA: Diagnosis not present

## 2022-04-04 DIAGNOSIS — J019 Acute sinusitis, unspecified: Secondary | ICD-10-CM | POA: Diagnosis not present

## 2022-04-25 DIAGNOSIS — N1832 Chronic kidney disease, stage 3b: Secondary | ICD-10-CM | POA: Diagnosis not present

## 2022-04-25 DIAGNOSIS — E1122 Type 2 diabetes mellitus with diabetic chronic kidney disease: Secondary | ICD-10-CM | POA: Diagnosis not present

## 2022-04-25 DIAGNOSIS — I2510 Atherosclerotic heart disease of native coronary artery without angina pectoris: Secondary | ICD-10-CM | POA: Diagnosis not present

## 2022-04-25 DIAGNOSIS — N200 Calculus of kidney: Secondary | ICD-10-CM | POA: Diagnosis not present

## 2022-04-25 DIAGNOSIS — R140 Abdominal distension (gaseous): Secondary | ICD-10-CM | POA: Diagnosis not present

## 2022-04-25 DIAGNOSIS — N179 Acute kidney failure, unspecified: Secondary | ICD-10-CM | POA: Diagnosis not present

## 2022-05-16 DIAGNOSIS — N179 Acute kidney failure, unspecified: Secondary | ICD-10-CM | POA: Diagnosis not present

## 2022-05-16 DIAGNOSIS — N261 Atrophy of kidney (terminal): Secondary | ICD-10-CM | POA: Diagnosis not present

## 2022-05-20 ENCOUNTER — Emergency Department
Admission: EM | Admit: 2022-05-20 | Discharge: 2022-05-21 | Disposition: A | Discharge status: Home / Self Care | Payer: Medicare Other | Attending: Emergency Medicine | Admitting: Emergency Medicine

## 2022-05-20 ENCOUNTER — Other Ambulatory Visit: Payer: Self-pay

## 2022-05-20 DIAGNOSIS — E860 Dehydration: Secondary | ICD-10-CM | POA: Insufficient documentation

## 2022-05-20 DIAGNOSIS — R5383 Other fatigue: Secondary | ICD-10-CM

## 2022-05-20 DIAGNOSIS — N179 Acute kidney failure, unspecified: Secondary | ICD-10-CM | POA: Diagnosis not present

## 2022-05-20 DIAGNOSIS — R5381 Other malaise: Secondary | ICD-10-CM | POA: Diagnosis not present

## 2022-05-20 LAB — CBC WITH DIFFERENTIAL/PLATELET
Abs Immature Granulocytes: 0.02 10*3/uL (ref 0.00–0.07)
Basophils Absolute: 0.1 10*3/uL (ref 0.0–0.1)
Basophils Relative: 1 %
Eosinophils Absolute: 0.2 10*3/uL (ref 0.0–0.5)
Eosinophils Relative: 3 %
HCT: 36.1 % (ref 36.0–46.0)
Hemoglobin: 12.7 g/dL (ref 12.0–15.0)
Immature Granulocytes: 0 %
Lymphocytes Relative: 31 %
Lymphs Abs: 1.6 10*3/uL (ref 0.7–4.0)
MCH: 31.8 pg (ref 26.0–34.0)
MCHC: 35.2 g/dL (ref 30.0–36.0)
MCV: 90.5 fL (ref 80.0–100.0)
Monocytes Absolute: 0.5 10*3/uL (ref 0.1–1.0)
Monocytes Relative: 9 %
Neutro Abs: 2.9 10*3/uL (ref 1.7–7.7)
Neutrophils Relative %: 56 %
Platelets: 216 10*3/uL (ref 150–400)
RBC: 3.99 MIL/uL (ref 3.87–5.11)
RDW: 13.7 % (ref 11.5–15.5)
WBC: 5.2 10*3/uL (ref 4.0–10.5)
nRBC: 0 % (ref 0.0–0.2)

## 2022-05-20 LAB — COMPREHENSIVE METABOLIC PANEL
ALT: 12 U/L (ref 0–44)
AST: 17 U/L (ref 15–41)
Albumin: 3.9 g/dL (ref 3.5–5.0)
Alkaline Phosphatase: 75 U/L (ref 38–126)
Anion gap: 7 (ref 5–15)
BUN: 22 mg/dL (ref 8–23)
CO2: 24 mmol/L (ref 22–32)
Calcium: 9.5 mg/dL (ref 8.9–10.3)
Chloride: 109 mmol/L (ref 98–111)
Creatinine, Ser: 1.61 mg/dL — ABNORMAL HIGH (ref 0.44–1.00)
GFR, Estimated: 34 mL/min — ABNORMAL LOW (ref >60)
Glucose, Bld: 164 mg/dL — ABNORMAL HIGH (ref 70–99)
Potassium: 3.7 mmol/L (ref 3.5–5.1)
Sodium: 140 mmol/L (ref 135–145)
Total Bilirubin: 0.7 mg/dL (ref 0.3–1.2)
Total Protein: 6.4 g/dL — ABNORMAL LOW (ref 6.5–8.1)

## 2022-05-20 LAB — TROPONIN I (HIGH SENSITIVITY)
Troponin I (High Sensitivity): 10 ng/L (ref <18)
Troponin I (High Sensitivity): 10 ng/L (ref <18)

## 2022-05-20 NOTE — ED Triage Notes (Signed)
First Nurse :Note:  Arrives c/o generalized weakness and not feeling well x 3 months.  AAOx3.  Skin warm and dry. NAD

## 2022-05-20 NOTE — ED Triage Notes (Signed)
Pt states her MD changed her Risperidone but she decided to take less than the recommended dosage and now feels like she has no energy

## 2022-05-21 NOTE — Discharge Instructions (Addendum)
Your workup in the Emergency Department today was reassuring.  We did not find any specific abnormalities other than it appears you are a little bit dehydrated.  We talked about putting in an IV and giving you some fluids, but your preference was to go home and focus on drinking more at home rather than staying for the fluids tonight.  We believe that is reasonable, so we recommend you drink plenty of fluids, take your regular medications and/or any new ones prescribed today, and follow up with the doctor(s) listed in these documents as recommended.  Return to the Emergency Department if you develop new or worsening symptoms that concern you.

## 2022-05-21 NOTE — ED Provider Notes (Signed)
[facility] Provider Note    Event Date/Time   First MD Initiated Contact with Patient 05/20/22 2357     (approximate)   History   Weakness   HPI  Crystal [name] is a 71 y.o. female who presents with her adult daughter for evaluation of about 3 months of fatigue.  She said that she feels like she has no energy and just has a hard time getting up and around.  She has no pain anywhere in particular other than some chronic pain in both of her shoulders.  She has not had any falls recently.  She said that she drinks a lot of lemonade but does not really drink anything else.  She has not had much of an appetite recently.  The symptoms been going on for quite a while.  She has seen her primary care provider as well as a "mental health specialist".  The mental health provider started her recently on risperidone, but then she said that her primary care provider expressed some concerns about her being on it, and then she says that she accidentally threw away the wrong medication, so she does not believe that she has been taking it.  She is not having chest pain, nausea, vomiting, abdominal pain, diarrhea, shortness of breath, fever, dysuria, or any new specific numbness nor weakness.  She has had a stroke in the past but has not been having any strokelike symptoms recently.  She is mostly concerned about her lack of energy.     Physical Exam   Triage Vital Signs: ED Triage Vitals  Enc Vitals Group     BP 05/20/22 1915 127/69     Pulse Rate 05/20/22 1915 86     Resp 05/20/22 1915 16     Temp 05/20/22 1915 98.2 F (36.8 C)     Temp Source 05/20/22 1915 Oral     SpO2 05/20/22 1915 94 %     Weight 05/20/22 1915 64 kg (141 lb)     Height 05/20/22 1915 1.499 m (4\' 11" )     Head Circumference --      Peak Flow --      Pain Score --      Pain Loc --      Pain Edu? --      Excl. in GC? --     Most recent vital signs: Vitals:   05/20/22 2209 05/21/22 0030  BP: (!)  148/64 (!) 147/58  Pulse: 73 61  Resp: 18 15  Temp: 97.7 F (36.5 C)   SpO2: 97% 96%     General: Awake, alert.  Appears to have at least a degree of chronic illness and deconditioning but is not in distress at this time. CV:  Good peripheral perfusion.  Regular rate and rhythm Resp:  Normal effort.  Speaking easily and comfortably.  No accessory muscle usage.  No wheezing. Abd:  No distention.  No tenderness to palpation of the abdomen. Other:  Patient has generalized weakness, and/or decreased effort provided on neuro exam.  However her strength is equal in both her arms and her legs, equal grip strength, tricep extension causes reproducible bilateral shoulder discomfort that she and her daughter say is chronic.  She has no specific identifiable focal neurological deficits with an NIH stroke scale of 0.   ED Results / Procedures / Treatments   Labs (all labs ordered are listed, but only abnormal results are displayed) Labs Reviewed  COMPREHENSIVE METABOLIC PANEL - Abnormal;  Notable for the following components:      Result Value   Glucose, Bld 164 (*)    Creatinine, Ser 1.61 (*)    Total Protein 6.4 (*)    GFR, Estimated 34 (*)    All other components within normal limits  CBC WITH DIFFERENTIAL/PLATELET  URINALYSIS, ROUTINE W REFLEX MICROSCOPIC  TROPONIN I (HIGH SENSITIVITY)  TROPONIN I (HIGH SENSITIVITY)     EKG  ED ECG REPORT I, Loleta Rose, the attending physician, personally viewed and interpreted this ECG.  Date: 05/20/2022 EKG Time: 19: 19 Rate: 86 Rhythm: normal sinus rhythm QRS Axis: normal Intervals: normal ST/T Wave abnormalities: normal Narrative Interpretation: no evidence of acute ischemia    PROCEDURES:  Critical Care performed: No  Procedures   MEDICATIONS ORDERED IN ED: Medications - No data to display   IMPRESSION / MDM / ASSESSMENT AND PLAN / ED COURSE  I reviewed the triage vital signs and the nursing notes.                               Differential diagnosis includes, but is not limited to, metabolic or electrolyte abnormality, renal dysfunction, infectious process, deconditioning, medication side effect, less likely CVA or other subacute neurological issue.  Patient's presentation is most consistent with exacerbation of chronic illness.  Labs/studies ordered initially include CMP, CBC with differential, high-sensitivity troponin, EKG.  EKG is reassuring with no evidence of ischemia nor arrhythmia.  Labs are normal except for a creatinine of 1.6.  This is above her baseline which seems to be around 1.2.  Otherwise her labs are all within normal limits.  She has a reassuring physical exam with generalized weakness which I believe is mostly due to deconditioning, particularly given that her symptoms been going on for 3 months.  It is also very likely that she is having some medication side effects, or perhaps side effects from inadvertently stopping 1 or more of her medications.  There is no evidence she is having an acute or even recent CVA.  She is not having any dysuria, has no leukocytosis, nor other infectious signs or symptoms.  I provided reassurance to the patient and her daughter.  I offered to have an IV placed and give her IV fluids for rehydration, but she would prefer not to do that tonight and promises instead to try to focus on rehydration at home with drinking more water and trying to eat more as well.  I think that is reasonable but I stressed to her the importance of rehydration and following up with her primary care provider.  She and her daughter plan to do so, and I gave my usual and customary return precautions.       FINAL CLINICAL IMPRESSION(S) / ED DIAGNOSES   Final diagnoses:  Other fatigue  Physical deconditioning  Acute kidney injury (HCC)  Dehydration     Rx / DC Orders   ED Discharge Orders     None        Note:  This document was prepared using Dragon voice recognition  software and may include unintentional dictation errors.   Loleta Rose, MD 05/21/22 [phone]

## 2022-05-21 NOTE — ED Notes (Signed)
E-signature pad unavailable - Pt verbalized understanding of D/C information - no additional concerns at this time.  

## 2022-05-27 DIAGNOSIS — R5383 Other fatigue: Secondary | ICD-10-CM | POA: Diagnosis not present

## 2022-05-27 DIAGNOSIS — Z87898 Personal history of other specified conditions: Secondary | ICD-10-CM | POA: Diagnosis not present

## 2022-09-05 DIAGNOSIS — Z1211 Encounter for screening for malignant neoplasm of colon: Secondary | ICD-10-CM | POA: Diagnosis not present

## 2022-09-05 DIAGNOSIS — I259 Chronic ischemic heart disease, unspecified: Secondary | ICD-10-CM | POA: Diagnosis not present

## 2022-09-05 DIAGNOSIS — Z79899 Other long term (current) drug therapy: Secondary | ICD-10-CM | POA: Diagnosis not present

## 2022-09-05 DIAGNOSIS — E119 Type 2 diabetes mellitus without complications: Secondary | ICD-10-CM | POA: Diagnosis not present

## 2022-09-05 DIAGNOSIS — Z23 Encounter for immunization: Secondary | ICD-10-CM | POA: Diagnosis not present

## 2022-09-05 DIAGNOSIS — E7800 Pure hypercholesterolemia, unspecified: Secondary | ICD-10-CM | POA: Diagnosis not present

## 2022-09-05 DIAGNOSIS — N183 Chronic kidney disease, stage 3 unspecified: Secondary | ICD-10-CM | POA: Diagnosis not present

## 2022-10-24 DIAGNOSIS — N1832 Chronic kidney disease, stage 3b: Secondary | ICD-10-CM | POA: Diagnosis not present

## 2022-10-24 DIAGNOSIS — N179 Acute kidney failure, unspecified: Secondary | ICD-10-CM | POA: Diagnosis not present

## 2022-10-24 DIAGNOSIS — N390 Urinary tract infection, site not specified: Secondary | ICD-10-CM | POA: Diagnosis not present

## 2022-10-24 DIAGNOSIS — E1122 Type 2 diabetes mellitus with diabetic chronic kidney disease: Secondary | ICD-10-CM | POA: Diagnosis not present

## 2022-10-24 DIAGNOSIS — N200 Calculus of kidney: Secondary | ICD-10-CM | POA: Diagnosis not present

## 2022-10-24 DIAGNOSIS — I2510 Atherosclerotic heart disease of native coronary artery without angina pectoris: Secondary | ICD-10-CM | POA: Diagnosis not present

## 2022-11-07 DIAGNOSIS — K219 Gastro-esophageal reflux disease without esophagitis: Secondary | ICD-10-CM | POA: Diagnosis not present

## 2022-11-07 DIAGNOSIS — N183 Chronic kidney disease, stage 3 unspecified: Secondary | ICD-10-CM | POA: Diagnosis not present

## 2022-11-07 DIAGNOSIS — R609 Edema, unspecified: Secondary | ICD-10-CM | POA: Diagnosis not present

## 2022-11-07 DIAGNOSIS — I259 Chronic ischemic heart disease, unspecified: Secondary | ICD-10-CM | POA: Diagnosis not present

## 2022-11-14 ENCOUNTER — Ambulatory Visit (INDEPENDENT_AMBULATORY_CARE_PROVIDER_SITE_OTHER): Payer: 59 | Admitting: Family

## 2022-11-14 ENCOUNTER — Encounter (HOSPITAL_BASED_OUTPATIENT_CLINIC_OR_DEPARTMENT_OTHER): Payer: Self-pay | Admitting: Family

## 2022-11-14 VITALS — BP 98/60 | HR 91 | Ht 59.0 in | Wt 156.0 lb

## 2022-11-14 DIAGNOSIS — R600 Localized edema: Secondary | ICD-10-CM

## 2022-11-14 DIAGNOSIS — Q211 Atrial septal defect, unspecified: Secondary | ICD-10-CM | POA: Diagnosis not present

## 2022-11-14 DIAGNOSIS — I739 Peripheral vascular disease, unspecified: Secondary | ICD-10-CM | POA: Diagnosis not present

## 2022-11-14 DIAGNOSIS — I2583 Coronary atherosclerosis due to lipid rich plaque: Secondary | ICD-10-CM | POA: Diagnosis not present

## 2022-11-14 DIAGNOSIS — I6523 Occlusion and stenosis of bilateral carotid arteries: Secondary | ICD-10-CM | POA: Diagnosis not present

## 2022-11-14 DIAGNOSIS — Z951 Presence of aortocoronary bypass graft: Secondary | ICD-10-CM | POA: Diagnosis not present

## 2022-11-14 DIAGNOSIS — E785 Hyperlipidemia, unspecified: Secondary | ICD-10-CM | POA: Diagnosis not present

## 2022-11-14 DIAGNOSIS — I10 Essential (primary) hypertension: Secondary | ICD-10-CM

## 2022-11-14 DIAGNOSIS — Z8673 Personal history of transient ischemic attack (TIA), and cerebral infarction without residual deficits: Secondary | ICD-10-CM | POA: Diagnosis not present

## 2022-11-14 DIAGNOSIS — I2510 Atherosclerotic heart disease of native coronary artery without angina pectoris: Secondary | ICD-10-CM | POA: Diagnosis not present

## 2022-11-14 MED ORDER — FUROSEMIDE 40 MG PO TABS: 40.0000 mg | ORAL_TABLET | Freq: Every day | ORAL | 0 refills | Status: DC
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
## 2022-11-14 NOTE — Patient Instructions (Addendum)
Medication Instructions:  Your physician has recommended you make the following change in your medication:   START Furosemide 63m daily   We will check in on you Tuesday to see how your swelling is doing.  *If you need a refill on your cardiac medications before your next appointment, please call your pharmacy*   Lab Work: Your physician recommends that you return for lab work today: BMP, BNP, CBC   If you have labs (blood work) drawn today and your tests are completely normal, you will receive your results only by: MyChart Message (if you have MyChart) OR A paper copy in the mail If you have any lab test that is abnormal or we need to change your treatment, we will call you to review the results.   Testing/Procedures: Your physician has requested that you have an [place] Echocardiography is a painless test that uses sound waves to create images of your heart. It provides your doctor with information about the size and shape of your heart and how well your heart's chambers and valves are working. This procedure takes approximately one hour. There are no restrictions for this procedure. Please do NOT wear cologne, perfume, aftershave, or lotions (deodorant is allowed). Please arrive 15 minutes prior to your appointment time.    Follow-Up: At [facility] and your health needs are our priority.  As part of our continuing mission to provide you with exceptional heart care, we have created designated Provider Care Teams.  These Care Teams include your primary Cardiologist (physician) and Advanced Practice Providers (APPs -  Physician Assistants and Nurse Practitioners) who all work together to provide you with the care you need, when you need it.  We recommend signing up for the patient portal called "MyChart".  Sign up information is provided on this After Visit Summary.  MyChart is used to connect with patients for Virtual  Visits (Telemedicine).  Patients are able to view lab/test results, encounter notes, upcoming appointments, etc.  Non-urgent messages can be sent to your provider as well.   To learn more about what you can do with MyChart, go to [email or web address]    Your next appointment:   6-8 week(s)  Provider:   MCandee Furbish MD or CLaurann [place] NP    Other Instructions   Your echocardiogram 07/2021 showed possible atrial septal defect. This is a hole between the two top chamber of your heart that was repaired during your open heart surgery. We will do an updated echo with bubble study so we can evaluate if it is present and its size.   To prevent or reduce lower extremity swelling: Eat a low salt diet. Salt makes the body hold onto extra fluid which causes swelling. Sit with legs elevated. For example, in the recliner or on an [place]  Wear knee-high compression stockings during the daytime. Ones labeled 15-20 mmHg provide good compression.

## 2022-11-15 LAB — COMPREHENSIVE METABOLIC PANEL
ALT: 5 IU/L (ref 0–32)
AST: 11 IU/L (ref 0–40)
Albumin/Globulin Ratio: 2.1 (ref 1.2–2.2)
Albumin: 3.7 g/dL — ABNORMAL LOW (ref 3.8–4.8)
Alkaline Phosphatase: 70 IU/L (ref 44–121)
BUN/Creatinine Ratio: 12 (ref 12–28)
BUN: 13 mg/dL (ref 8–27)
Bilirubin Total: 0.3 mg/dL (ref 0.0–1.2)
CO2: 24 mmol/L (ref 20–29)
Calcium: 8.9 mg/dL (ref 8.7–10.3)
Chloride: 101 mmol/L (ref 96–106)
Creatinine, Ser: 1.13 mg/dL — ABNORMAL HIGH (ref 0.57–1.00)
Globulin, Total: 1.8 g/dL (ref 1.5–4.5)
Glucose: 116 mg/dL — ABNORMAL HIGH (ref 70–99)
Potassium: 3.8 mmol/L (ref 3.5–5.2)
Sodium: 140 mmol/L (ref 134–144)
Total Protein: 5.5 g/dL — ABNORMAL LOW (ref 6.0–8.5)
eGFR: 52 mL/min/{1.73_m2} — ABNORMAL LOW (ref >59)

## 2022-11-15 LAB — BRAIN NATRIURETIC PEPTIDE: BNP: 34.2 pg/mL (ref 0.0–100.0)

## 2022-11-15 LAB — CBC
Hematocrit: 29 % — ABNORMAL LOW (ref 34.0–46.6)
Hemoglobin: 10.3 g/dL — ABNORMAL LOW (ref 11.1–15.9)
MCH: 35.5 pg — ABNORMAL HIGH (ref 26.6–33.0)
MCHC: 35.5 g/dL (ref 31.5–35.7)
MCV: 100 fL — ABNORMAL HIGH (ref 79–97)
Platelets: 232 10*3/uL (ref 150–450)
RBC: 2.9 x10E6/uL — ABNORMAL LOW (ref 3.77–5.28)
RDW: 12.2 % (ref 11.7–15.4)
WBC: 5.8 10*3/uL (ref 3.4–10.8)

## 2022-11-17 ENCOUNTER — Telehealth (HOSPITAL_BASED_OUTPATIENT_CLINIC_OR_DEPARTMENT_OTHER): Payer: Self-pay

## 2022-11-17 DIAGNOSIS — R600 Localized edema: Secondary | ICD-10-CM

## 2022-11-17 MED ORDER — FUROSEMIDE 40 MG PO TABS: ORAL_TABLET | ORAL | 3 refills | Status: DC

## 2022-11-17 MED ORDER — IRON 325 (65 FE) MG PO TABS: 325.0000 mg | ORAL_TABLET | Freq: Every day | ORAL | 3 refills | Status: AC

## 2022-11-17 NOTE — Addendum Note (Signed)
Addended by: Gerald Stabs on: 11/17/2022 01:51 PM   Modules accepted: Orders

## 2022-11-17 NOTE — Telephone Encounter (Signed)
Called results to patient and left results on VM (ok per DPR), instructions left to call office back if patient has any questions!     "Loel Dubonnet, NP  Gerald Stabs, RN Continue Lasix 54m daily for 3 more days then recommend taking three days per week."

## 2022-11-17 NOTE — Telephone Encounter (Addendum)
Results called to patient who verbalizes understanding! Patient states that swelling is still present but has gone down a little. Will route back to NP to ensure we do not need to change Lasix dosing.    ----- Message from Crystal Dubonnet, NP sent at 11/17/2022  9:10 AM EST ----- Kidney function improved from previous. Normal liver enzymes. CBC shows anemia - recommend follow up with primary care regarding this. Would add over the counter iron supplement daily (321m). BNP with no volume overload.   Please call to check in on how swelling is doing after Lasix 447mdaily over the weekend.

## 2022-11-28 ENCOUNTER — Ambulatory Visit: Payer: 59 | Attending: Family

## 2022-11-28 DIAGNOSIS — Q211 Atrial septal defect, unspecified: Secondary | ICD-10-CM

## 2022-11-28 DIAGNOSIS — R600 Localized edema: Secondary | ICD-10-CM | POA: Diagnosis not present

## 2022-11-28 LAB — ECHOCARDIOGRAM COMPLETE BUBBLE STUDY
Area-P 1/2: 8.52 cm2
S' Lateral: 2.6 cm

## 2022-12-12 ENCOUNTER — Telehealth (HOSPITAL_BASED_OUTPATIENT_CLINIC_OR_DEPARTMENT_OTHER): Payer: Self-pay

## 2022-12-12 DIAGNOSIS — Q211 Atrial septal defect, unspecified: Secondary | ICD-10-CM

## 2022-12-12 DIAGNOSIS — I739 Peripheral vascular disease, unspecified: Secondary | ICD-10-CM

## 2022-12-12 DIAGNOSIS — Z8673 Personal history of transient ischemic attack (TIA), and cerebral infarction without residual deficits: Secondary | ICD-10-CM

## 2022-12-12 NOTE — Telephone Encounter (Addendum)
Left message for patient to call back     ----- Message from Loel Dubonnet, NP sent at 12/11/2022  5:12 PM EDT ----- Reviewed with Dr. Marlou Porch. Echocardiogram shows normal heart pumping function. Heart muscle mildly stiff which is expected with age. No significant valvular abnormality. Bubble study overall looks negative.   No need for further evaluation of prior ASD closure.  Given history of stroke would recommend 14 day ZIO monitor to ensure no abnormal heart rhythms that caused her stroke. We can mail to her or put it on at her upcoming [place] 3/29 - whichever is her preference.

## 2022-12-15 ENCOUNTER — Other Ambulatory Visit (HOSPITAL_BASED_OUTPATIENT_CLINIC_OR_DEPARTMENT_OTHER): Payer: Self-pay | Admitting: Family

## 2022-12-15 ENCOUNTER — Ambulatory Visit: Payer: 59 | Attending: Family

## 2022-12-15 DIAGNOSIS — Z8673 Personal history of transient ischemic attack (TIA), and cerebral infarction without residual deficits: Secondary | ICD-10-CM

## 2022-12-15 DIAGNOSIS — R42 Dizziness and giddiness: Secondary | ICD-10-CM

## 2022-12-15 DIAGNOSIS — I739 Peripheral vascular disease, unspecified: Secondary | ICD-10-CM

## 2022-12-15 DIAGNOSIS — Q211 Atrial septal defect, unspecified: Secondary | ICD-10-CM

## 2022-12-15 NOTE — Addendum Note (Signed)
Addended by: Gerald Stabs on: 12/15/2022 12:42 PM   Modules accepted: Orders

## 2022-12-15 NOTE — Progress Notes (Unsigned)
Enrolled for [employer] to mail a ZIO XT [place] term holter monitor to the patients address on file.   Dr. Skains to read. 

## 2022-12-15 NOTE — Telephone Encounter (Signed)
Results called to patient who verbalizes understanding! Patient would like it mailed to her. Order placed.      ----- Message from Loel Dubonnet, NP sent at 12/11/2022  5:12 PM EDT ----- Reviewed with Dr. Marlou Porch. Echocardiogram shows normal heart pumping function. Heart muscle mildly stiff which is expected with age. No significant valvular abnormality. Bubble study overall looks negative.    No need for further evaluation of prior ASD closure.  Given history of stroke would recommend 14 day ZIO monitor to ensure no abnormal heart rhythms that caused her stroke. We can mail to her or put it on at her upcoming [place] 3/29 - whichever is her preference.

## 2022-12-18 DIAGNOSIS — Z8673 Personal history of transient ischemic attack (TIA), and cerebral infarction without residual deficits: Secondary | ICD-10-CM

## 2022-12-18 DIAGNOSIS — R42 Dizziness and giddiness: Secondary | ICD-10-CM | POA: Diagnosis not present

## 2022-12-18 DIAGNOSIS — I739 Peripheral vascular disease, unspecified: Secondary | ICD-10-CM

## 2022-12-18 DIAGNOSIS — Q211 Atrial septal defect, unspecified: Secondary | ICD-10-CM

## 2022-12-26 ENCOUNTER — Encounter (HOSPITAL_BASED_OUTPATIENT_CLINIC_OR_DEPARTMENT_OTHER): Payer: Self-pay | Admitting: Family

## 2022-12-26 ENCOUNTER — Ambulatory Visit (INDEPENDENT_AMBULATORY_CARE_PROVIDER_SITE_OTHER): Payer: 59 | Admitting: Family

## 2022-12-26 VITALS — BP 121/74 | HR 96 | Ht 59.0 in | Wt 157.5 lb

## 2022-12-26 DIAGNOSIS — I25118 Atherosclerotic heart disease of native coronary artery with other forms of angina pectoris: Secondary | ICD-10-CM | POA: Diagnosis not present

## 2022-12-26 DIAGNOSIS — R052 Subacute cough: Secondary | ICD-10-CM

## 2022-12-26 DIAGNOSIS — Z951 Presence of aortocoronary bypass graft: Secondary | ICD-10-CM

## 2022-12-26 DIAGNOSIS — Q211 Atrial septal defect, unspecified: Secondary | ICD-10-CM

## 2022-12-26 DIAGNOSIS — I10 Essential (primary) hypertension: Secondary | ICD-10-CM | POA: Diagnosis not present

## 2022-12-26 DIAGNOSIS — E785 Hyperlipidemia, unspecified: Secondary | ICD-10-CM

## 2022-12-26 MED ORDER — SPIRONOLACTONE 25 MG PO TABS: 12.5000 mg | ORAL_TABLET | Freq: Every day | ORAL | 1 refills | Status: DC

## 2022-12-26 MED ORDER — BENZONATATE 100 MG PO CAPS: 100.0000 mg | ORAL_CAPSULE | Freq: Three times a day (TID) | ORAL | 0 refills | Status: DC | PRN
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
## 2022-12-26 NOTE — Patient Instructions (Signed)
Medication Instructions:  Your physician has recommended you make the following change in your medication:   Stop: Lasix   Start: Spironolactone 12.5mg  daily (half tablet)   Start: Iron 325mg  daily   Start: Tessalon Pearls   *If you need a refill on your cardiac medications before your next appointment, please call your pharmacy*   Lab Work: Please return for Lab work within the next week for BMP and CBC. You may come to the...   [facility] (3rd floor) [address], [place], [place] [address]  Open: 8am-Noon and 1pm-4:30pm  Please ring the doorbell on the small table when you exit the elevator and the Lab Tech will come get you  [place] [facility] [address] [place], [place], [place] [address] Open: 8am-1pm, then 2pm-4:30pm   [place]- Please see attached locations sheet stapled to your lab work with address and hours.   If you have labs (blood work) drawn today and your tests are completely normal, you will receive your results only by: [place] (if you have MyChart) OR A paper copy in the mail If you have any lab test that is abnormal or we need to change your treatment, we will call you to review the results.  Follow-Up: At [facility], you and your health needs are our priority.  As part of our continuing mission to provide you with exceptional heart care, we have created designated Provider Care Teams.  These Care Teams include your primary Cardiologist (physician) and Advanced Practice Providers (APPs -  Physician Assistants and Nurse Practitioners) who all work together to provide you with the care you need, when you need it.  We recommend signing up for the patient portal called "MyChart".  Sign up information is provided on this After Visit Summary.  MyChart is used to connect with patients for Virtual Visits (Telemedicine).  Patients are able to view lab/test results, encounter notes, upcoming  appointments, etc.  Non-urgent messages can be sent to your provider as well.   To learn more about what you can do with MyChart, go to [email or web address].    Your next appointment:   Follow up as scheduled

## 2022-12-29 ENCOUNTER — Telehealth (HOSPITAL_BASED_OUTPATIENT_CLINIC_OR_DEPARTMENT_OTHER): Payer: Self-pay

## 2022-12-29 NOTE — Telephone Encounter (Addendum)
Called pharmacy to verify last pick up for Imdur 60mg  daily- it was last delivered on 3/13 for one month supply- she has been taking a half tablet.    Called patient to verify she is still taking- if so will update prescription for 30mg  tablet- no answer, left message to call back.    ----- Message from Loel Dubonnet, NP sent at 12/29/2022  3:45 PM EDT ----- Can we call pharmacy to confirm she is off imdur and take of med list if so?TY!

## 2022-12-30 MED ORDER — ISOSORBIDE MONONITRATE ER 30 MG PO TB24: 30.0000 mg | ORAL_TABLET | Freq: Every day | ORAL | 3 refills | Status: DC

## 2022-12-30 NOTE — Addendum Note (Signed)
Addended by: Gerald Stabs on: 12/30/2022 10:10 AM   Modules accepted: Orders

## 2022-12-30 NOTE — Telephone Encounter (Signed)
Returned call to patient, she does verify that she has Imdur 60mg  tablets and has been taking a half tablet. Per Laurann Montana, NP have patient stay on same dose. New Imdur 30mg  tablet daily sent to pharmacy on file. Patient verbalizes understanding.

## 2023-01-20 ENCOUNTER — Telehealth: Payer: Self-pay

## 2023-01-20 NOTE — Patient Outreach (Signed)
  Care Coordination   01/20/2023 Name: Crystal [name] MRN: [id number] DOB: 04-08-1951   Care Coordination Outreach Attempts:  An unsuccessful telephone outreach was attempted today to offer the patient information about available care coordination services as a benefit of their health plan.   Follow Up Plan:  Additional outreach attempts will be made to offer the patient care coordination information and services.   Encounter Outcome:  No Answer   Care Coordination Interventions:  No, not indicated    Rowe Pavy, RN, BSN, [facility] [employer] [phone]

## 2023-01-21 ENCOUNTER — Telehealth: Payer: Self-pay

## 2023-01-21 NOTE — Patient Outreach (Signed)
  Care Coordination   Initial Visit Note   01/21/2023 Name: Crystal [name] MRN: [id number] DOB: [birth year]  Crystal [name] is a 72 y.o. year old female who sees Crystal [name], [place] for primary care. I spoke with  Crystal [name] by phone today.  What matters to the patients health and wellness today?  Placed call to patient to review and offer [facility] care coordination program.  Patient reports that she is self managing well. Denies any current needs.     SDOH assessments and interventions completed:  No     Care Coordination Interventions:  No, not indicated   Follow up plan: No further intervention required.    Encounter Outcome:  Pt. Refused   Crystal Pavy, RN, BSN, [facility] [employer] [phone]

## 2023-01-29 ENCOUNTER — Other Ambulatory Visit: Payer: Self-pay | Admitting: *Deleted

## 2023-01-29 DIAGNOSIS — I739 Peripheral vascular disease, unspecified: Secondary | ICD-10-CM

## 2023-01-30 ENCOUNTER — Telehealth: Payer: Self-pay

## 2023-01-30 NOTE — Telephone Encounter (Signed)
Received refill request for Furosemide.  Noted Furosemide was discontinued at last visit with Gillian Shields NP.  Call to pharmacy pt has not refilled Lasix since Februrary.  Attempted x3 to contact patient and verify she is taking only Spironolactone.  No answer received VM full. Will attempt to contact patient again to assess if she has been taking both Spironolactone and Lasix.  If so will discuss the need for Labwork with Gillian Shields NP. Jim Like MHA RN CCM

## 2023-02-06 ENCOUNTER — Encounter: Payer: Self-pay | Admitting: Vascular Surgery

## 2023-02-06 ENCOUNTER — Ambulatory Visit (INDEPENDENT_AMBULATORY_CARE_PROVIDER_SITE_OTHER): Payer: 59 | Admitting: Vascular Surgery

## 2023-02-06 ENCOUNTER — Ambulatory Visit (HOSPITAL_COMMUNITY)
Admission: RE | Admit: 2023-02-06 | Discharge: 2023-02-06 | Disposition: A | Discharge status: Home / Self Care | Payer: 59 | Source: Ambulatory Visit | Attending: Vascular Surgery | Admitting: Vascular Surgery

## 2023-02-06 VITALS — BP 117/72 | HR 85 | Temp 97.9°F | Resp 20 | Ht 59.0 in | Wt 155.0 lb

## 2023-02-06 DIAGNOSIS — I739 Peripheral vascular disease, unspecified: Secondary | ICD-10-CM | POA: Diagnosis present

## 2023-02-06 DIAGNOSIS — M7989 Other specified soft tissue disorders: Secondary | ICD-10-CM | POA: Diagnosis not present

## 2023-02-06 DIAGNOSIS — I70213 Atherosclerosis of native arteries of extremities with intermittent claudication, bilateral legs: Secondary | ICD-10-CM

## 2023-02-06 LAB — VAS US ABI WITH/WO TBI
Left ABI: 0.81
Right ABI: 0.57
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
## 2023-02-09 ENCOUNTER — Telehealth (HOSPITAL_BASED_OUTPATIENT_CLINIC_OR_DEPARTMENT_OTHER): Payer: Self-pay | Admitting: Family

## 2023-02-09 NOTE — Telephone Encounter (Signed)
Will address dosing based on lab work. Appreciate nursing team calling Miss Trumbull.   Alver Sorrow, NP

## 2023-02-09 NOTE — Telephone Encounter (Signed)
Returned call to patient, after speaking with patient there seems to be some confusion with medications. She believes she has been taking her spironolactone either every other day or every 2-3 days. She thinks is what the bottle states, patient unable to review bottle while on the phone.   Advised patient to have labs done this week to check status, before we can change medications.     "Was recently evaluated by [facility] after referral for bilateral leg pain.  They did encourage optimization of her diuretic regimen.  On review of chart at visit 12/26/2022 she was recommended to stop Lasix as she was bothered by urinary frequency and start spironolactone 12.5 mg daily with CBC/BMP 1 week following. Labs not yet collected.   Will route to nursing team to remind her to have labs done.    If labs stable and swelling not well controlled, could consider increasing dose of Spironolactone pending lab results.    Alver Sorrow, NP"

## 2023-02-09 NOTE — Telephone Encounter (Signed)
  Was recently evaluated by [facility] after referral for bilateral leg pain.  They did encourage optimization of her diuretic regimen.  On review of chart at visit 12/26/2022 she was recommended to stop Lasix as she was bothered by urinary frequency and start spironolactone 12.5 mg daily with CBC/BMP 1 week following. Labs not yet collected.  Will route to nursing team to remind her to have labs done.   If labs stable and swelling not well controlled, could consider increasing dose of Spironolactone pending lab results.   Alver Sorrow, NP

## 2023-02-09 NOTE — Telephone Encounter (Signed)
FYI

## 2023-02-19 ENCOUNTER — Telehealth (HOSPITAL_BASED_OUTPATIENT_CLINIC_OR_DEPARTMENT_OTHER): Payer: Self-pay

## 2023-02-19 DIAGNOSIS — I10 Essential (primary) hypertension: Secondary | ICD-10-CM

## 2023-02-19 LAB — CBC
Hematocrit: 29.3 % — ABNORMAL LOW (ref 34.0–46.6)
Hemoglobin: 10.6 g/dL — ABNORMAL LOW (ref 11.1–15.9)
MCH: 36.9 pg — ABNORMAL HIGH (ref 26.6–33.0)
MCHC: 36.2 g/dL — ABNORMAL HIGH (ref 31.5–35.7)
MCV: 102 fL — ABNORMAL HIGH (ref 79–97)
Platelets: 228 10*3/uL (ref 150–450)
RBC: 2.87 x10E6/uL — ABNORMAL LOW (ref 3.77–5.28)
RDW: 13 % (ref 11.7–15.4)
WBC: 5.2 10*3/uL (ref 3.4–10.8)

## 2023-02-19 LAB — BASIC METABOLIC PANEL
BUN/Creatinine Ratio: 11 — ABNORMAL LOW (ref 12–28)
BUN: 15 mg/dL (ref 8–27)
CO2: 17 mmol/L — ABNORMAL LOW (ref 20–29)
Calcium: 8.8 mg/dL (ref 8.7–10.3)
Chloride: 97 mmol/L (ref 96–106)
Creatinine, Ser: 1.41 mg/dL — ABNORMAL HIGH (ref 0.57–1.00)
Glucose: 177 mg/dL — ABNORMAL HIGH (ref 70–99)
Potassium: 4 mmol/L (ref 3.5–5.2)
Sodium: 130 mmol/L — ABNORMAL LOW (ref 134–144)
eGFR: 40 mL/min/{1.73_m2} — ABNORMAL LOW (ref >59)

## 2023-02-19 NOTE — Telephone Encounter (Addendum)
Left message for patient to call back     ----- Message from Alver Sorrow, NP sent at 02/19/2023  7:55 AM EDT ----- CBC with stable mild anemia. Kidney function decreased from prior with normal potassium.  Ensure not taking Furosemide (Lasix) any longer. Kidney function could be decreased due to volume overload as it was noted when she saw [facility] she had more swelling.   Recommend taking Spironolactone half tablet once per day in the morning and repeat BMP/BNP in one week. Ensure elevating legs, following low salt diet, wearing compression socks.

## 2023-02-20 NOTE — Telephone Encounter (Signed)
2nd call attempt, reviewed the following recommendations. She has not been taking her lasix and is agreeable to repeat labs. Labs ordered and mailed to patient.     "----- Message from Alver Sorrow, NP sent at 02/19/2023  7:55 AM EDT ----- CBC with stable mild anemia. Kidney function decreased from prior with normal potassium.  Ensure not taking Furosemide (Lasix) any longer. Kidney function could be decreased due to volume overload as it was noted when she saw [facility] she had more swelling.    Recommend taking Spironolactone half tablet once per day in the morning and repeat BMP/BNP in one week. Ensure elevating legs, following low salt diet, wearing compression socks."

## 2023-02-20 NOTE — Addendum Note (Signed)
Addended by: Marlene Lard on: 02/20/2023 01:16 PM   Modules accepted: Orders

## 2023-03-03 DIAGNOSIS — I10 Essential (primary) hypertension: Secondary | ICD-10-CM | POA: Diagnosis not present

## 2023-03-04 LAB — BASIC METABOLIC PANEL
BUN/Creatinine Ratio: 12 (ref 12–28)
BUN: 16 mg/dL (ref 8–27)
CO2: 21 mmol/L (ref 20–29)
Calcium: 9.5 mg/dL (ref 8.7–10.3)
Chloride: 106 mmol/L (ref 96–106)
Creatinine, Ser: 1.34 mg/dL — ABNORMAL HIGH (ref 0.57–1.00)
Glucose: 106 mg/dL — ABNORMAL HIGH (ref 70–99)
Potassium: 4.8 mmol/L (ref 3.5–5.2)
Sodium: 141 mmol/L (ref 134–144)
eGFR: 42 mL/min/{1.73_m2} — ABNORMAL LOW (ref >59)

## 2023-03-04 LAB — BRAIN NATRIURETIC PEPTIDE: BNP: 57.8 pg/mL (ref 0.0–100.0)

## 2023-03-13 DIAGNOSIS — N183 Chronic kidney disease, stage 3 unspecified: Secondary | ICD-10-CM | POA: Diagnosis not present

## 2023-03-13 DIAGNOSIS — I259 Chronic ischemic heart disease, unspecified: Secondary | ICD-10-CM | POA: Diagnosis not present

## 2023-03-13 DIAGNOSIS — E119 Type 2 diabetes mellitus without complications: Secondary | ICD-10-CM | POA: Diagnosis not present

## 2023-03-13 DIAGNOSIS — K219 Gastro-esophageal reflux disease without esophagitis: Secondary | ICD-10-CM | POA: Diagnosis not present

## 2023-03-13 DIAGNOSIS — Z1211 Encounter for screening for malignant neoplasm of colon: Secondary | ICD-10-CM | POA: Diagnosis not present

## 2023-03-13 DIAGNOSIS — J449 Chronic obstructive pulmonary disease, unspecified: Secondary | ICD-10-CM | POA: Diagnosis not present

## 2023-03-13 DIAGNOSIS — Z79899 Other long term (current) drug therapy: Secondary | ICD-10-CM | POA: Diagnosis not present

## 2023-03-13 DIAGNOSIS — E7800 Pure hypercholesterolemia, unspecified: Secondary | ICD-10-CM | POA: Diagnosis not present

## 2023-03-13 DIAGNOSIS — Z9181 History of falling: Secondary | ICD-10-CM | POA: Diagnosis not present

## 2023-03-16 DIAGNOSIS — Z79899 Other long term (current) drug therapy: Secondary | ICD-10-CM | POA: Diagnosis not present

## 2023-04-03 ENCOUNTER — Ambulatory Visit: Payer: Medicare Other | Admitting: Cardiology

## 2023-05-01 ENCOUNTER — Ambulatory Visit: Payer: 59 | Admitting: Cardiology

## 2023-05-01 NOTE — Progress Notes (Deleted)
  Cardiology Office Note:  .   Date:  05/01/2023  ID:  Crystal [name], DOB 24-Jan-1951, MRN [id number] PCP: Lonie Peak, PA-C  [facility] Providers Cardiologist:  Donato Schultz, MD { Click to update primary MD,subspecialty MD or APP then REFRESH:1}   History of Present Illness: Marland Kitchen   Crystal [name] is a 72 y.o. female here for follow-up CAD.  CABG 2002 RIMA to LAD PCI x 2 subsequent Patch ASD during CABG Peripheral arterial disease status post right femoral artery patch angioplasty Hypertension Diabetes, hyperlipidemia, COPD Stroke 2022 left PCA Non-STEMI 2021    ROS: ***  Studies Reviewed: .        Echocardiogram 2022-EF 50% basal inferior hypokinesis grade 1 diastolic dysfunction mild AI corrected ASD patch  Carotids 2022-mild bilateral plaque  Left heart catheterization 07/02/2020-mild nonobstructive disease, patent bare-metal right coronary artery stent    Risk Assessment/Calculations:   {Does this patient have ATRIAL FIBRILLATION?:[phone]} No BP recorded.  {Refresh Note OR Click here to enter BP  :1}***       Physical Exam:   VS:  There were no vitals taken for this visit.   Wt Readings from Last 3 Encounters:  02/06/23 155 lb (70.3 kg)  12/26/22 157 lb 8 oz (71.4 kg)  11/14/22 156 lb (70.8 kg)    GEN: Well nourished, well developed in no acute distress NECK: No JVD; No carotid bruits CARDIAC: ***RRR, no murmurs, rubs, gallops RESPIRATORY:  Clear to auscultation without rales, wheezing or rhonchi  ABDOMEN: Soft, non-tender, non-distended EXTREMITIES:  No edema; No deformity   ASSESSMENT AND PLAN: .    Coronary artery disease post CABG with subsequent RCA PCI -Overall doing well on goal-directed medical therapy which includes aspirin atorvastatin.  Metoprolol was previously discontinued because of bradycardia.  Stroke - 2022 left PCA, ZIO reassuring.  Small ASD - Corrected during bypass surgery.  Overall negative bubble study  Peripheral vascular  disease - Patch angioplasty femoral artery.  Continue with exercise.  Carotid stenosis - Mild bilaterally.  Aspirin atorvastatin.  Psychosis - Psychiatry  Hyperlipidemia - Atorvastatin 80 mg once a day.  LDL goal less than 70     {Are you ordering a CV Procedure (e.g. stress test, cath, DCCV, TEE, etc)?   Press F2        :[id number]}  Dispo: ***  Signed, Donato Schultz, MD

## 2023-05-13 ENCOUNTER — Encounter (HOSPITAL_BASED_OUTPATIENT_CLINIC_OR_DEPARTMENT_OTHER): Payer: Self-pay | Admitting: Cardiology

## 2023-05-13 ENCOUNTER — Ambulatory Visit (HOSPITAL_BASED_OUTPATIENT_CLINIC_OR_DEPARTMENT_OTHER): Payer: 59 | Admitting: Cardiology

## 2023-05-13 ENCOUNTER — Ambulatory Visit (INDEPENDENT_AMBULATORY_CARE_PROVIDER_SITE_OTHER): Payer: 59 | Admitting: Cardiology

## 2023-05-13 VITALS — BP 130/70 | HR 77 | Ht 59.0 in | Wt 151.5 lb

## 2023-05-13 DIAGNOSIS — Z951 Presence of aortocoronary bypass graft: Secondary | ICD-10-CM

## 2023-05-13 DIAGNOSIS — I739 Peripheral vascular disease, unspecified: Secondary | ICD-10-CM

## 2023-05-13 DIAGNOSIS — I10 Essential (primary) hypertension: Secondary | ICD-10-CM

## 2023-05-13 DIAGNOSIS — I25118 Atherosclerotic heart disease of native coronary artery with other forms of angina pectoris: Secondary | ICD-10-CM | POA: Diagnosis not present

## 2023-05-13 MED ORDER — FUROSEMIDE 20 MG PO TABS: 20.0000 mg | ORAL_TABLET | Freq: Every day | ORAL | 3 refills | Status: DC

## 2023-05-13 NOTE — Progress Notes (Signed)
Cardiology Office Note:  .   Date:  05/13/2023  ID:  Crystal [name], DOB [birth year], MRN [id number] PCP: Lonie Peak, PA-C  [facility] Providers Cardiologist:  Donato Schultz, MD    History of Present Illness: .    Discussed the use of AI scribe software for clinical note transcription with the patient, who gave verbal consent to proceed.  History of Present Illness   A 72 year old patient with a history of coronary artery disease status post CABG in 2002, subsequent PCI times two, small ASD with patch angioplasty to the right femoral artery, prior stroke, diabetes, COPD, and psychosis, presents for a follow-up visit regarding lower extremity edema. The patient was seen by a vascular surgeon who suggested that the symptoms were likely multifactorial, with a possible component of claudication. The patient's ABIs were 0.57 on the right and 0.81 on the left, suggesting that the majority of the pain may be due to lower extremity swelling and not arterial insufficiency. The patient had previously stopped taking Lasix due to urinary frequency and started on spironolactone 12.5 mg daily, with stable renal function. The patient reports that the edema and associated pain have worsened, hindering her ability to walk.        Studies Reviewed: Marland Kitchen   EKG Interpretation Date/Time:  Wednesday May 13 2023 15:15:16 EDT Ventricular Rate:  77 PR Interval:  164 QRS Duration:  74 QT Interval:  414 QTC Calculation: 468 R Axis:   24  Text Interpretation: Normal sinus rhythm Normal ECG When compared with ECG of 20-May-2022 19:19, No significant change was found Confirmed by Donato Schultz ([id number]) on 05/13/2023 3:17:28 PM    LABS Creatinine: 1.3 Hemoglobin: 10.7 LDL: 106  DIAGNOSTIC ABI: 8.75 on the right, 0.81 on the left (02/06/2023) Echocardiogram: EF 60%, grade 1 diastolic dysfunction (11/28/2022)  Risk Assessment/Calculations:           Physical Exam:   VS:  BP 130/70 (BP Location: Left  Arm, Patient Position: Sitting, Cuff Size: Normal)   Pulse 77   Ht 4\' 11"  (1.499 m)   Wt 151 lb 8 oz (68.7 kg)   BMI 30.60 kg/m    Wt Readings from Last 3 Encounters:  05/13/23 151 lb 8 oz (68.7 kg)  02/06/23 155 lb (70.3 kg)  12/26/22 157 lb 8 oz (71.4 kg)    GEN: Well nourished, well developed in no acute distress NECK: No JVD; No carotid bruits CARDIAC: RRR, no m, rubs, gallops RESPIRATORY:  Clear to auscultation without rales, wheezing or rhonchi  ABDOMEN: Soft, non-tender, non-distended EXTREMITIES:  1-2+ BLE edema; Purple LE. No deformity   ASSESSMENT AND PLAN: .    Assessment and Plan    Lower Extremity Edema Worsening symptoms despite diuretic therapy with Spironolactone 12.5mg  daily. Vascular surgery consult suggests symptoms may be multifactorial, with some contribution from claudication. ABIs suggest arterial insufficiency may not be the primary cause of symptoms. -Continue Spironolactone 12.5mg  daily. -Add Furosemide 20mg  daily to optimize diuretic therapy. -Encourage continued use of compression stockings and leg elevation. Check BMET in 2 weeks. 3 month f/u APP.   Coronary Artery Disease Status post CABG in 2002 and subsequent PCI x2. Stable with reassuring pump function (EF 60%) on recent echocardiogram. -Continue current management.  Chronic Kidney Disease (Stage 3A) Stable with recent creatinine of 1.3. -Continue current management, monitor renal function with diuretic adjustment.  Follow-up Continue close monitoring with the team.             Signed, Loraine Leriche  Anne Fu, MD

## 2023-05-13 NOTE — Patient Instructions (Signed)
Medication Instructions:  Your physician has recommended you make the following change in your medication: 1) START taking Lasix (furosemide) 20 mg daily *If you need a refill on your cardiac medications before your next appointment, please call your pharmacy*  Lab Work: IN TWO WEEKS: BMET  Follow-Up: At [facility], you and your health needs are our priority.  As part of our continuing mission to provide you with exceptional heart care, we have created designated Provider Care Teams.  These Care Teams include your primary Cardiologist (physician) and Advanced Practice Providers (APPs -  Physician Assistants and Nurse Practitioners) who all work together to provide you with the care you need, when you need it.  Your next appointment:   2-3 months  Provider:   Gillian Shields, NP

## 2023-05-19 ENCOUNTER — Other Ambulatory Visit: Payer: Self-pay | Admitting: *Deleted

## 2023-05-19 DIAGNOSIS — D649 Anemia, unspecified: Secondary | ICD-10-CM | POA: Diagnosis not present

## 2023-05-19 DIAGNOSIS — I2510 Atherosclerotic heart disease of native coronary artery without angina pectoris: Secondary | ICD-10-CM | POA: Diagnosis not present

## 2023-05-19 DIAGNOSIS — E1122 Type 2 diabetes mellitus with diabetic chronic kidney disease: Secondary | ICD-10-CM | POA: Diagnosis not present

## 2023-05-19 DIAGNOSIS — N200 Calculus of kidney: Secondary | ICD-10-CM | POA: Diagnosis not present

## 2023-05-19 DIAGNOSIS — R609 Edema, unspecified: Secondary | ICD-10-CM | POA: Diagnosis not present

## 2023-05-19 DIAGNOSIS — N1832 Chronic kidney disease, stage 3b: Secondary | ICD-10-CM | POA: Diagnosis not present

## 2023-05-19 MED ORDER — SPIRONOLACTONE 25 MG PO TABS: 12.5000 mg | ORAL_TABLET | Freq: Every day | ORAL | 3 refills | Status: DC

## 2023-05-20 ENCOUNTER — Telehealth: Payer: Self-pay | Admitting: Cardiology

## 2023-05-20 NOTE — Telephone Encounter (Signed)
Made aware that we are aware, ordered this regimen and monitoring with follow up blood work. She appreciates the return call/information.

## 2023-05-20 NOTE — Telephone Encounter (Signed)
Pt c/o medication issue:  1. Name of Medication:  furosemide (LASIX) 20 MG tablet  2. How are you currently taking this medication (dosage and times per day)?   3. Are you having a reaction (difficulty breathing--STAT)?   4. What is your medication issue?   Natalie with [facility] would like to clarify whether patient should be taking Spironolactone and Lasix. Please advise.

## 2023-05-26 ENCOUNTER — Ambulatory Visit: Payer: 59 | Admitting: Cardiology

## 2023-06-19 DIAGNOSIS — I259 Chronic ischemic heart disease, unspecified: Secondary | ICD-10-CM | POA: Diagnosis not present

## 2023-06-19 DIAGNOSIS — E1122 Type 2 diabetes mellitus with diabetic chronic kidney disease: Secondary | ICD-10-CM | POA: Diagnosis not present

## 2023-06-19 DIAGNOSIS — N183 Chronic kidney disease, stage 3 unspecified: Secondary | ICD-10-CM | POA: Diagnosis not present

## 2023-06-19 DIAGNOSIS — Z79899 Other long term (current) drug therapy: Secondary | ICD-10-CM | POA: Diagnosis not present

## 2023-06-19 DIAGNOSIS — Z1231 Encounter for screening mammogram for malignant neoplasm of breast: Secondary | ICD-10-CM | POA: Diagnosis not present

## 2023-06-19 DIAGNOSIS — J449 Chronic obstructive pulmonary disease, unspecified: Secondary | ICD-10-CM | POA: Diagnosis not present

## 2023-06-19 DIAGNOSIS — E7800 Pure hypercholesterolemia, unspecified: Secondary | ICD-10-CM | POA: Diagnosis not present

## 2023-06-19 DIAGNOSIS — K219 Gastro-esophageal reflux disease without esophagitis: Secondary | ICD-10-CM | POA: Diagnosis not present

## 2023-07-02 DIAGNOSIS — I10 Essential (primary) hypertension: Secondary | ICD-10-CM | POA: Diagnosis not present

## 2023-07-14 ENCOUNTER — Ambulatory Visit: Payer: 59 | Admitting: Cardiology

## 2023-07-16 DIAGNOSIS — N390 Urinary tract infection, site not specified: Secondary | ICD-10-CM | POA: Diagnosis not present

## 2023-07-16 DIAGNOSIS — N183 Chronic kidney disease, stage 3 unspecified: Secondary | ICD-10-CM | POA: Diagnosis not present
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
## 2023-07-31 ENCOUNTER — Encounter: Payer: Self-pay | Admitting: Nurse Practitioner

## 2023-07-31 ENCOUNTER — Other Ambulatory Visit: Payer: Self-pay

## 2023-07-31 ENCOUNTER — Ambulatory Visit: Payer: 59 | Attending: Nurse Practitioner | Admitting: Nurse Practitioner

## 2023-07-31 VITALS — BP 140/86 | HR 95 | Ht 59.0 in | Wt 152.0 lb

## 2023-07-31 DIAGNOSIS — I739 Peripheral vascular disease, unspecified: Secondary | ICD-10-CM

## 2023-07-31 DIAGNOSIS — I10 Essential (primary) hypertension: Secondary | ICD-10-CM | POA: Diagnosis not present

## 2023-07-31 DIAGNOSIS — Z8673 Personal history of transient ischemic attack (TIA), and cerebral infarction without residual deficits: Secondary | ICD-10-CM | POA: Diagnosis not present

## 2023-07-31 DIAGNOSIS — R600 Localized edema: Secondary | ICD-10-CM | POA: Diagnosis not present

## 2023-07-31 DIAGNOSIS — E785 Hyperlipidemia, unspecified: Secondary | ICD-10-CM

## 2023-07-31 DIAGNOSIS — I25118 Atherosclerotic heart disease of native coronary artery with other forms of angina pectoris: Secondary | ICD-10-CM | POA: Diagnosis not present

## 2023-07-31 MED ORDER — FUROSEMIDE 20 MG PO TABS: 20.0000 mg | ORAL_TABLET | Freq: Every day | ORAL | 0 refills | Status: DC

## 2023-07-31 MED ORDER — ISOSORBIDE MONONITRATE ER 30 MG PO TB24: 15.0000 mg | ORAL_TABLET | Freq: Every day | ORAL | 1 refills | Status: DC

## 2023-07-31 NOTE — Patient Instructions (Addendum)
Medication Instructions:  STOP Spironolactone (TAKE THIS MEDICATION OUT OF YOUR PILL PACK; THE TABLET SHOULD BE A HALF) DECREASE Imdur to 15mg  (contact your pharmacy and have them tell you what the pill looks like  so you an cut it in half) INCREASE Lasix to 20mg  1 tablet twice a day for 3 days then take 1 tablet daily. (We will give you a extra bottle of lasix to have on hand)  *If you need a refill on your cardiac medications before your next appointment, please call your pharmacy*   Lab Work: BMET in 1 week If you have labs (blood work) drawn today and your tests are completely normal, you will receive your results only by: MyChart Message (if you have MyChart) OR A paper copy in the mail If you have any lab test that is abnormal or we need to change your treatment, we will call you to review the results.   Testing/Procedures: None ordered   Follow-Up: At [facility], you and your health needs are our priority.  As part of our continuing mission to provide you with exceptional heart care, we have created designated Provider Care Teams.  These Care Teams include your primary Cardiologist (physician) and Advanced Practice Providers (APPs -  Physician Assistants and Nurse Practitioners) who all work together to provide you with the care you need, when you need it.  We recommend signing up for the patient portal called "MyChart".  Sign up information is provided on this After Visit Summary.  MyChart is used to connect with patients for Virtual Visits (Telemedicine).  Patients are able to view lab/test results, encounter notes, upcoming appointments, etc.  Non-urgent messages can be sent to your provider as well.   To learn more about what you can do with MyChart, go to [email or web address].    Your next appointment:   3 month(s)  Provider:   Donato Schultz, MD     Other Instructions  Please check your weight daily. Please contact the office if you gain more than 2lbs in a  day or 5lbs in a week.  Limit your salt intake to 1500-2000mg  per day or 500mg  of Sodium per meal.

## 2023-08-02 DIAGNOSIS — I10 Essential (primary) hypertension: Secondary | ICD-10-CM | POA: Diagnosis not present
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
## 2023-08-13 ENCOUNTER — Encounter: Payer: Self-pay | Admitting: Vascular Surgery

## 2023-08-13 ENCOUNTER — Ambulatory Visit: Payer: 59 | Admitting: Vascular Surgery

## 2023-08-13 VITALS — BP 150/79 | HR 86 | Temp 97.7°F | Resp 20 | Ht 59.0 in | Wt 150.0 lb

## 2023-08-13 DIAGNOSIS — R42 Dizziness and giddiness: Secondary | ICD-10-CM

## 2023-08-13 DIAGNOSIS — I70213 Atherosclerosis of native arteries of extremities with intermittent claudication, bilateral legs: Secondary | ICD-10-CM | POA: Diagnosis not present

## 2023-08-13 DIAGNOSIS — M7989 Other specified soft tissue disorders: Secondary | ICD-10-CM

## 2023-08-14 ENCOUNTER — Ambulatory Visit: Payer: 59 | Admitting: Vascular Surgery

## 2023-08-17 ENCOUNTER — Other Ambulatory Visit: Payer: Self-pay

## 2023-08-17 DIAGNOSIS — M7989 Other specified soft tissue disorders: Secondary | ICD-10-CM

## 2023-08-17 DIAGNOSIS — I70213 Atherosclerosis of native arteries of extremities with intermittent claudication, bilateral legs: Secondary | ICD-10-CM

## 2023-08-17 DIAGNOSIS — I6523 Occlusion and stenosis of bilateral carotid arteries: Secondary | ICD-10-CM

## 2023-08-31 ENCOUNTER — Ambulatory Visit (INDEPENDENT_AMBULATORY_CARE_PROVIDER_SITE_OTHER)
Admission: RE | Admit: 2023-08-31 | Discharge: 2023-08-31 | Disposition: A | Discharge status: Home / Self Care | Payer: 59 | Source: Ambulatory Visit | Attending: Vascular Surgery | Admitting: Vascular Surgery

## 2023-08-31 ENCOUNTER — Ambulatory Visit (INDEPENDENT_AMBULATORY_CARE_PROVIDER_SITE_OTHER)
Admission: RE | Admit: 2023-08-31 | Discharge: 2023-08-31 | Disposition: A | Discharge status: Home / Self Care | Payer: 59 | Source: Ambulatory Visit | Attending: Vascular Surgery

## 2023-08-31 ENCOUNTER — Ambulatory Visit (HOSPITAL_COMMUNITY)
Admission: RE | Admit: 2023-08-31 | Discharge: 2023-08-31 | Disposition: A | Discharge status: Home / Self Care | Payer: 59 | Source: Ambulatory Visit | Attending: Vascular Surgery | Admitting: Vascular Surgery

## 2023-08-31 DIAGNOSIS — M7989 Other specified soft tissue disorders: Secondary | ICD-10-CM | POA: Diagnosis not present

## 2023-08-31 DIAGNOSIS — I6523 Occlusion and stenosis of bilateral carotid arteries: Secondary | ICD-10-CM

## 2023-08-31 DIAGNOSIS — I70213 Atherosclerosis of native arteries of extremities with intermittent claudication, bilateral legs: Secondary | ICD-10-CM | POA: Insufficient documentation

## 2023-08-31 LAB — VAS US ABI WITH/WO TBI
Left ABI: 1.28
Right ABI: 0.87

## 2023-09-01 ENCOUNTER — Ambulatory Visit: Payer: 59 | Admitting: Cardiology

## 2023-09-02 DIAGNOSIS — I10 Essential (primary) hypertension: Secondary | ICD-10-CM | POA: Diagnosis not present
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
## 2023-09-03 ENCOUNTER — Encounter: Payer: 59 | Admitting: Vascular Surgery

## 2023-09-25 DIAGNOSIS — J209 Acute bronchitis, unspecified: Secondary | ICD-10-CM | POA: Diagnosis not present

## 2023-09-25 DIAGNOSIS — I10 Essential (primary) hypertension: Secondary | ICD-10-CM | POA: Diagnosis not present

## 2023-09-25 DIAGNOSIS — Z20822 Contact with and (suspected) exposure to covid-19: Secondary | ICD-10-CM | POA: Diagnosis not present

## 2023-10-03 ENCOUNTER — Other Ambulatory Visit: Payer: Self-pay | Admitting: Nurse Practitioner

## 2023-10-03 DIAGNOSIS — I10 Essential (primary) hypertension: Secondary | ICD-10-CM | POA: Diagnosis not present
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
## 2023-10-08 ENCOUNTER — Ambulatory Visit (INDEPENDENT_AMBULATORY_CARE_PROVIDER_SITE_OTHER): Payer: 59 | Admitting: Vascular Surgery

## 2023-10-08 DIAGNOSIS — I739 Peripheral vascular disease, unspecified: Secondary | ICD-10-CM | POA: Diagnosis not present

## 2023-10-08 DIAGNOSIS — I6523 Occlusion and stenosis of bilateral carotid arteries: Secondary | ICD-10-CM

## 2023-10-08 DIAGNOSIS — M7989 Other specified soft tissue disorders: Secondary | ICD-10-CM | POA: Diagnosis not present

## 2023-10-20 ENCOUNTER — Other Ambulatory Visit: Payer: Self-pay

## 2023-10-20 DIAGNOSIS — I6523 Occlusion and stenosis of bilateral carotid arteries: Secondary | ICD-10-CM

## 2023-10-20 DIAGNOSIS — I739 Peripheral vascular disease, unspecified: Secondary | ICD-10-CM

## 2023-10-28 DIAGNOSIS — Z0000 Encounter for general adult medical examination without abnormal findings: Secondary | ICD-10-CM | POA: Diagnosis not present

## 2023-10-28 DIAGNOSIS — H26493 Other secondary cataract, bilateral: Secondary | ICD-10-CM | POA: Diagnosis not present

## 2023-10-28 DIAGNOSIS — Z139 Encounter for screening, unspecified: Secondary | ICD-10-CM | POA: Diagnosis not present

## 2023-10-28 DIAGNOSIS — H53453 Other localized visual field defect, bilateral: Secondary | ICD-10-CM | POA: Diagnosis not present

## 2023-10-28 DIAGNOSIS — E119 Type 2 diabetes mellitus without complications: Secondary | ICD-10-CM | POA: Diagnosis not present

## 2023-10-28 DIAGNOSIS — Z9181 History of falling: Secondary | ICD-10-CM | POA: Diagnosis not present

## 2023-10-28 DIAGNOSIS — Z1231 Encounter for screening mammogram for malignant neoplasm of breast: Secondary | ICD-10-CM | POA: Diagnosis not present

## 2023-11-02 DIAGNOSIS — Z79899 Other long term (current) drug therapy: Secondary | ICD-10-CM | POA: Diagnosis not present

## 2023-11-02 DIAGNOSIS — K219 Gastro-esophageal reflux disease without esophagitis: Secondary | ICD-10-CM | POA: Diagnosis not present

## 2023-11-02 DIAGNOSIS — E7800 Pure hypercholesterolemia, unspecified: Secondary | ICD-10-CM | POA: Diagnosis not present

## 2023-11-02 DIAGNOSIS — Z7409 Other reduced mobility: Secondary | ICD-10-CM | POA: Diagnosis not present

## 2023-11-02 DIAGNOSIS — J449 Chronic obstructive pulmonary disease, unspecified: Secondary | ICD-10-CM | POA: Diagnosis not present

## 2023-11-02 DIAGNOSIS — E1122 Type 2 diabetes mellitus with diabetic chronic kidney disease: Secondary | ICD-10-CM | POA: Diagnosis not present

## 2023-11-02 DIAGNOSIS — N183 Chronic kidney disease, stage 3 unspecified: Secondary | ICD-10-CM | POA: Diagnosis not present

## 2023-11-02 DIAGNOSIS — Z23 Encounter for immunization: Secondary | ICD-10-CM | POA: Diagnosis not present

## 2023-11-02 DIAGNOSIS — I259 Chronic ischemic heart disease, unspecified: Secondary | ICD-10-CM | POA: Diagnosis not present

## 2023-11-03 DIAGNOSIS — I10 Essential (primary) hypertension: Secondary | ICD-10-CM | POA: Diagnosis not present

## 2023-11-27 ENCOUNTER — Ambulatory Visit: Payer: 59 | Attending: Cardiology | Admitting: Cardiology

## 2023-12-03 DIAGNOSIS — I10 Essential (primary) hypertension: Secondary | ICD-10-CM | POA: Diagnosis not present
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
## 2023-12-22 ENCOUNTER — Encounter: Payer: Self-pay | Admitting: Nurse Practitioner

## 2023-12-22 ENCOUNTER — Ambulatory Visit: Attending: Nurse Practitioner | Admitting: Nurse Practitioner

## 2023-12-22 ENCOUNTER — Other Ambulatory Visit: Payer: Self-pay

## 2023-12-22 VITALS — BP 122/60 | HR 63 | Ht 60.0 in | Wt 136.4 lb

## 2023-12-22 DIAGNOSIS — R600 Localized edema: Secondary | ICD-10-CM | POA: Diagnosis not present

## 2023-12-22 DIAGNOSIS — I25118 Atherosclerotic heart disease of native coronary artery with other forms of angina pectoris: Secondary | ICD-10-CM

## 2023-12-22 DIAGNOSIS — I10 Essential (primary) hypertension: Secondary | ICD-10-CM

## 2023-12-22 DIAGNOSIS — Z8673 Personal history of transient ischemic attack (TIA), and cerebral infarction without residual deficits: Secondary | ICD-10-CM

## 2023-12-22 DIAGNOSIS — I739 Peripheral vascular disease, unspecified: Secondary | ICD-10-CM

## 2023-12-22 MED ORDER — EZETIMIBE 10 MG PO TABS: 10.0000 mg | ORAL_TABLET | Freq: Every day | ORAL | 3 refills | Status: AC

## 2023-12-22 MED ORDER — NITROGLYCERIN 0.4 MG SL SUBL: 0.4000 mg | SUBLINGUAL_TABLET | SUBLINGUAL | Status: AC | PRN

## 2023-12-22 NOTE — Patient Instructions (Signed)
 Medication Instructions:  START Zetia 10mg  Take 1 tablet daily  *If you need a refill on your cardiac medications before your next appointment, please call your pharmacy*   Lab Work: 8 WEEKS FASTING LABS- LIPIDS & LFTS If you have labs (blood work) drawn today and your tests are completely normal, you will receive your results only by: MyChart Message (if you have MyChart) OR A paper copy in the mail If you have any lab test that is abnormal or we need to change your treatment, we will call you to review the results.   Testing/Procedures: None ordered   Follow-Up: At [facility], you and your health needs are our priority.  As part of our continuing mission to provide you with exceptional heart care, we have created designated Provider Care Teams.  These Care Teams include your primary Cardiologist (physician) and Advanced Practice Providers (APPs -  Physician Assistants and Nurse Practitioners) who all work together to provide you with the care you need, when you need it.  We recommend signing up for the patient portal called "MyChart".  Sign up information is provided on this After Visit Summary.  MyChart is used to connect with patients for Virtual Visits (Telemedicine).  Patients are able to view lab/test results, encounter notes, upcoming appointments, etc.  Non-urgent messages can be sent to your provider as well.   To learn more about what you can do with MyChart, go to [email or web address].    Your next appointment:   6 month(s)  Provider:   Donato Schultz, MD    Other Instructions   1st Floor: - Lobby - Registration  - Pharmacy  - Lab - Cafe  2nd Floor: - PV Lab - Diagnostic Testing (echo, CT, nuclear med)  3rd Floor: - Vacant  4th Floor: - TCTS (cardiothoracic surgery) - AFib Clinic - Structural Heart Clinic - Vascular Surgery  - Vascular Ultrasound  5th Floor: - HeartCare Cardiology (general and EP) - Clinical Pharmacy for coumadin,  hypertension, lipid, weight-loss medications, and med management appointments    Valet parking services will be available as well.

## 2024-01-03 DIAGNOSIS — I10 Essential (primary) hypertension: Secondary | ICD-10-CM | POA: Diagnosis not present

## 2024-01-07 DIAGNOSIS — I25118 Atherosclerotic heart disease of native coronary artery with other forms of angina pectoris: Secondary | ICD-10-CM | POA: Diagnosis not present

## 2024-01-07 DIAGNOSIS — I739 Peripheral vascular disease, unspecified: Secondary | ICD-10-CM | POA: Diagnosis not present

## 2024-01-07 DIAGNOSIS — Z8673 Personal history of transient ischemic attack (TIA), and cerebral infarction without residual deficits: Secondary | ICD-10-CM | POA: Diagnosis not present

## 2024-01-07 DIAGNOSIS — I10 Essential (primary) hypertension: Secondary | ICD-10-CM | POA: Diagnosis not present

## 2024-01-07 DIAGNOSIS — R600 Localized edema: Secondary | ICD-10-CM | POA: Diagnosis not present

## 2024-01-08 ENCOUNTER — Telehealth: Payer: Self-pay

## 2024-01-08 LAB — LIPID PANEL
Chol/HDL Ratio: 5.4 ratio — ABNORMAL HIGH (ref 0.0–4.4)
Cholesterol, Total: 185 mg/dL (ref 100–199)
HDL: 34 mg/dL — ABNORMAL LOW (ref >39)
LDL Chol Calc (NIH): 119 mg/dL — ABNORMAL HIGH (ref 0–99)
Triglycerides: 177 mg/dL — ABNORMAL HIGH (ref 0–149)
VLDL Cholesterol Cal: 32 mg/dL (ref 5–40)

## 2024-01-08 LAB — HEPATIC FUNCTION PANEL
ALT: 5 IU/L (ref 0–32)
AST: 11 IU/L (ref 0–40)
Albumin: 3.9 g/dL (ref 3.8–4.8)
Alkaline Phosphatase: 69 IU/L (ref 44–121)
Bilirubin Total: 0.3 mg/dL (ref 0.0–1.2)
Bilirubin, Direct: 0.1 mg/dL (ref 0.00–0.40)
Total Protein: 5.9 g/dL — ABNORMAL LOW (ref 6.0–8.5)

## 2024-01-08 NOTE — Telephone Encounter (Signed)
 Contacted the patient due to her completing fasting labs (lipids & lfts) too soon. I reminded the patient that at her appointment we told her to complete labs after starting Zetia. Patient replied she completed labs after her appointment and did not realize she needed to take the medication first.   Spoke with the patient in great detail about taking Zetia daily for two months before going to have fasting labs. Asked the patient did she pick up her zetia. She states she was unsure, advised the patient to go check her medication cabinet to be safe. I advised the patient the rx was sent on 12/22/23 to her pharmacy. Advised the patient that I would contact her pharmacy to see if they received the rx. Patient advised multiple times to take Zetia 1 tablet once a day for 8 weeks then have fasting labs.  Contacted [employer] and they confirmed the rx was received on 12/22/23, but when they attempted to contact the patient multiple times to have new rx delivered the patient did not respond until today. Pharmacist states the patient lives far and has her medication delivered. He states Zetia went out today to the patient and she should have it later today.   He also states the patient blood pressure has been running high he states she is on a blood pressure monitoring program and he will fax over the readings. He stated he is not sure who monitors her blood pressure but wanted to inform someone.   Contacted the patient and made her aware that the Zetia will be delivered today and to start it today. She voiced understanding and thanked me for calling.

## 2024-01-11 ENCOUNTER — Telehealth: Payer: Self-pay | Admitting: Cardiology

## 2024-01-11 NOTE — Telephone Encounter (Signed)
  Per staff message, attempted to contact patient three times to schedule 6 month follow up and left messages. Today I mailed recall letter since patient has not responded to our calls.

## 2024-02-01 DIAGNOSIS — N183 Chronic kidney disease, stage 3 unspecified: Secondary | ICD-10-CM | POA: Diagnosis not present

## 2024-02-01 DIAGNOSIS — Z7409 Other reduced mobility: Secondary | ICD-10-CM | POA: Diagnosis not present

## 2024-02-01 DIAGNOSIS — I259 Chronic ischemic heart disease, unspecified: Secondary | ICD-10-CM | POA: Diagnosis not present

## 2024-02-01 DIAGNOSIS — K219 Gastro-esophageal reflux disease without esophagitis: Secondary | ICD-10-CM | POA: Diagnosis not present

## 2024-02-01 DIAGNOSIS — E1122 Type 2 diabetes mellitus with diabetic chronic kidney disease: Secondary | ICD-10-CM | POA: Diagnosis not present

## 2024-02-01 DIAGNOSIS — J449 Chronic obstructive pulmonary disease, unspecified: Secondary | ICD-10-CM | POA: Diagnosis not present

## 2024-02-01 DIAGNOSIS — E7800 Pure hypercholesterolemia, unspecified: Secondary | ICD-10-CM | POA: Diagnosis not present

## 2024-02-01 LAB — LAB REPORT - SCANNED
A1c: 5.7
EGFR: 45

## 2024-02-02 DIAGNOSIS — I10 Essential (primary) hypertension: Secondary | ICD-10-CM | POA: Diagnosis not present

## 2024-03-04 DIAGNOSIS — I10 Essential (primary) hypertension: Secondary | ICD-10-CM | POA: Diagnosis not present

## 2024-04-03 DIAGNOSIS — I10 Essential (primary) hypertension: Secondary | ICD-10-CM | POA: Diagnosis not present

## 2024-05-04 DIAGNOSIS — J449 Chronic obstructive pulmonary disease, unspecified: Secondary | ICD-10-CM | POA: Diagnosis not present

## 2024-05-04 DIAGNOSIS — Z79899 Other long term (current) drug therapy: Secondary | ICD-10-CM | POA: Diagnosis not present

## 2024-05-04 DIAGNOSIS — N183 Chronic kidney disease, stage 3 unspecified: Secondary | ICD-10-CM | POA: Diagnosis not present

## 2024-05-04 DIAGNOSIS — K219 Gastro-esophageal reflux disease without esophagitis: Secondary | ICD-10-CM | POA: Diagnosis not present

## 2024-05-04 DIAGNOSIS — I259 Chronic ischemic heart disease, unspecified: Secondary | ICD-10-CM | POA: Diagnosis not present

## 2024-05-04 DIAGNOSIS — E7800 Pure hypercholesterolemia, unspecified: Secondary | ICD-10-CM | POA: Diagnosis not present

## 2024-05-04 DIAGNOSIS — E1122 Type 2 diabetes mellitus with diabetic chronic kidney disease: Secondary | ICD-10-CM | POA: Diagnosis not present

## 2024-05-04 DIAGNOSIS — I10 Essential (primary) hypertension: Secondary | ICD-10-CM | POA: Diagnosis not present

## 2024-05-20 DIAGNOSIS — N1832 Chronic kidney disease, stage 3b: Secondary | ICD-10-CM | POA: Diagnosis not present

## 2024-05-20 DIAGNOSIS — N200 Calculus of kidney: Secondary | ICD-10-CM | POA: Diagnosis not present

## 2024-05-20 DIAGNOSIS — R609 Edema, unspecified: Secondary | ICD-10-CM | POA: Diagnosis not present

## 2024-05-20 DIAGNOSIS — D649 Anemia, unspecified: Secondary | ICD-10-CM | POA: Diagnosis not present

## 2024-05-20 DIAGNOSIS — E1122 Type 2 diabetes mellitus with diabetic chronic kidney disease: Secondary | ICD-10-CM | POA: Diagnosis not present

## 2024-05-26 ENCOUNTER — Other Ambulatory Visit: Payer: Self-pay | Admitting: Nurse Practitioner

## 2024-06-03 DIAGNOSIS — I10 Essential (primary) hypertension: Secondary | ICD-10-CM | POA: Diagnosis not present

## 2024-06-06 DIAGNOSIS — N390 Urinary tract infection, site not specified: Secondary | ICD-10-CM | POA: Diagnosis not present

## 2024-06-06 DIAGNOSIS — R509 Fever, unspecified: Secondary | ICD-10-CM | POA: Diagnosis not present

## 2024-06-10 DIAGNOSIS — Z23 Encounter for immunization: Secondary | ICD-10-CM | POA: Diagnosis not present

## 2024-06-20 ENCOUNTER — Encounter: Payer: Self-pay | Admitting: Cardiology

## 2024-06-20 ENCOUNTER — Ambulatory Visit: Attending: Cardiology | Admitting: Cardiology

## 2024-06-20 VITALS — BP 118/64 | HR 70 | Ht 60.0 in | Wt 143.1 lb

## 2024-06-20 DIAGNOSIS — I70213 Atherosclerosis of native arteries of extremities with intermittent claudication, bilateral legs: Secondary | ICD-10-CM

## 2024-06-20 DIAGNOSIS — E785 Hyperlipidemia, unspecified: Secondary | ICD-10-CM

## 2024-06-20 DIAGNOSIS — I6523 Occlusion and stenosis of bilateral carotid arteries: Secondary | ICD-10-CM | POA: Diagnosis not present

## 2024-06-20 DIAGNOSIS — I25118 Atherosclerotic heart disease of native coronary artery with other forms of angina pectoris: Secondary | ICD-10-CM

## 2024-06-20 DIAGNOSIS — I10 Essential (primary) hypertension: Secondary | ICD-10-CM

## 2024-06-20 DIAGNOSIS — Z951 Presence of aortocoronary bypass graft: Secondary | ICD-10-CM

## 2024-06-20 DIAGNOSIS — I739 Peripheral vascular disease, unspecified: Secondary | ICD-10-CM

## 2024-06-20 DIAGNOSIS — Z8673 Personal history of transient ischemic attack (TIA), and cerebral infarction without residual deficits: Secondary | ICD-10-CM | POA: Diagnosis not present

## 2024-06-20 NOTE — Patient Instructions (Signed)
 Medication Instructions:  The current medical regimen is effective;  continue present plan and medications.  *If you need a refill on your cardiac medications before your next appointment, please call your pharmacy*  You have been referred to our Pharmacy Team to discuss further treatment of your lipids.  Follow-Up: At [facility], you and your health needs are our priority.  As part of our continuing mission to provide you with exceptional heart care, our providers are all part of one team.  This team includes your primary Cardiologist (physician) and Advanced Practice Providers or APPs (Physician Assistants and Nurse Practitioners) who all work together to provide you with the care you need, when you need it.  Your next appointment:   1 year(s)  Provider:   Oneil Parchment, MD    We recommend signing up for the patient portal called MyChart.  Sign up information is provided on this After Visit Summary.  MyChart is used to connect with patients for Virtual Visits (Telemedicine).  Patients are able to view lab/test results, encounter notes, upcoming appointments, etc.  Non-urgent messages can be sent to your provider as well.   To learn more about what you can do with MyChart, go to [email or web address].

## 2024-06-20 NOTE — Progress Notes (Signed)
 Cardiology Office Note:  .   Date:  06/20/2024  ID:  Crystal [name], DOB 1951-04-06, MRN [id number] PCP: Montey Lot, PA-C  [facility] Providers Cardiologist:  Oneil Parchment, MD     History of Present Illness: Crystal   Simrat P [name] is a 73 y.o. female Discussed the use of AI scribe software  History of Present Illness Crystal [name] is a 73 year old female with coronary artery disease and prior bypass surgery who presents for follow-up.  She has a history of coronary artery disease with a bypass surgery in 2002, where a right internal mammary artery to right coronary artery graft was performed. She subsequently required percutaneous coronary interventions twice due to occlusion, with a left heart catheterization in 2021 showing a patent stent in the right coronary artery. She is currently managed with isosorbide  mononitrate 15 mg for chest pain, ezetimibe  10 mg, and atorvastatin  80 mg for cholesterol management.  She experienced a stroke in October 2022.  She has peripheral arterial disease with a history of patch angioplasty to the right femoral artery in 1999. She reports leg pain that has been present for three days, describing it as 'hurts like crazy' and associated with difficulty walking. She also mentions neuropathy-like symptoms, stating 'the nerves are sensitive.'  Her past medical history includes hypertension, diabetes, hyperlipidemia, and chronic obstructive pulmonary disease (COPD). She is on atorvastatin  80 mg and aspirin  81 mg. Isosorbide  mononitrate was previously decreased to 15 mg due to dizziness with positional changes.  During the review of symptoms, her legs are doing okay, but she cannot walk on them due to pain.     Studies Reviewed: .        Results LABS LDL: 120 mg/dL Risk Assessment/Calculations:            Physical Exam:   VS:  BP 118/64 (BP Location: Right Arm, Patient Position: Sitting, Cuff Size: Large)   Pulse 70   Ht 5' (1.524 m)   Wt 143  lb 2 oz (64.9 kg)   SpO2 98%   BMI 27.95 kg/m    Wt Readings from Last 3 Encounters:  06/20/24 143 lb 2 oz (64.9 kg)  12/22/23 136 lb 6.4 oz (61.9 kg)  08/13/23 150 lb (68 kg)    GEN: Well nourished, well developed in no acute distress NECK: No JVD; No carotid bruits CARDIAC: RRR, no murmurs, no rubs, no gallops RESPIRATORY:  Clear to auscultation without rales, wheezing or rhonchi  ABDOMEN: Soft, non-tender, non-distended EXTREMITIES:  No edema; No deformity   ASSESSMENT AND PLAN: .    Assessment and Plan Assessment & Plan Coronary artery disease, status post CABG and PCI Coronary artery disease with CABG in 2002 and subsequent PCI. Currently well-managed with isosorbide  mononitrate 15 mg for angina, with no significant issues reported. - Continue isosorbide  mononitrate 15 mg daily  Hyperlipidemia in the setting of atherosclerotic cardiovascular disease Hyperlipidemia with LDL at 120 mg/dL, suboptimal for atherosclerotic cardiovascular disease management. Current treatment includes atorvastatin  80 mg and ezetimibe  10 mg daily. Discussed potential addition of Repatha (evolocumab) to further lower LDL and stabilize plaque. She is open to trying Repatha with support from her daughter. - Arrange consultation with pharmacist to discuss Repatha - Ensure insurance coverage for Repatha - Continue atorvastatin  80 mg daily - Continue ezetimibe  10 mg daily  Right lower extremity pain and neuropathy Intermittent right lower extremity pain with neuropathic characteristics, persisting for three days at a time, severe enough to impair mobility.  Peripheral arterial disease is suggested as a contributing factor. - Continue follow-up with Dr. Lanis for peripheral arterial disease management         Dispo: 1 yr  Signed, Oneil Parchment, MD

## 2024-07-03 DIAGNOSIS — I10 Essential (primary) hypertension: Secondary | ICD-10-CM | POA: Diagnosis not present

## 2024-07-11 ENCOUNTER — Telehealth: Payer: Self-pay | Admitting: Cardiology

## 2024-07-11 NOTE — Telephone Encounter (Signed)
 Spoke with pt regarding her swelling. Pt stated one of her feet is very swollen and red and is very painful to walk on. Pt was told to go to the Emergency Room to be evaluated. Pt verbalized understanding. All questions if any were answered.

## 2024-07-11 NOTE — Telephone Encounter (Signed)
 Pt c/o swelling/edema: STAT if pt has developed SOB within 24 hours  If swelling, where is the swelling located? Legs and she states it hurts to walk   How much weight have you gained and in what time span? No   Have you gained 2 pounds in a day or 5 pounds in a week? No   Do you have a log of your daily weights (if so, list)? N/a   Are you currently taking a fluid pill? Yes   Are you currently SOB? No only with exertion   Have you traveled recently in a car or plane for an extended period of time? No

## 2024-07-18 DIAGNOSIS — E1122 Type 2 diabetes mellitus with diabetic chronic kidney disease: Secondary | ICD-10-CM | POA: Diagnosis not present

## 2024-07-18 DIAGNOSIS — M109 Gout, unspecified: Secondary | ICD-10-CM | POA: Diagnosis not present

## 2024-07-18 DIAGNOSIS — N183 Chronic kidney disease, stage 3 unspecified: Secondary | ICD-10-CM | POA: Diagnosis not present

## 2024-07-18 DIAGNOSIS — L603 Nail dystrophy: Secondary | ICD-10-CM | POA: Diagnosis not present

## 2024-07-18 NOTE — Progress Notes (Unsigned)
 Office Visit    Patient Name: Crystal [name] Date of Encounter: 07/18/2024  Primary Care Provider:  Montey Lot, PA-C Primary Cardiologist:  Oneil Parchment, MD  Chief Complaint    Hyperlipidemia   Significant Past Medical History   CAD/PAD CABG in 2002, angioplasty to the right femoral artery in 1999   DM A1c is 5.7 (A1c was 6.7 in Aug 2022)  CVA Oct. 2022     Allergies  Allergen Reactions   Bactrim [Sulfamethoxazole-Trimethoprim] Nausea And Vomiting   Sulfa Antibiotics Other (See Comments)    unknown   Trimethoprim    Penicillins Itching, Rash and Other (See Comments)    Unknown     History of Present Illness    Crystal [name] is a 73 y.o. female patient of Dr. Montey, in the office today to discuss options for cholesterol management.  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 Eat plenty of proteins and vegetables.      Accessory Clinical Findings   Lab Results  Component Value Date   CHOL 185 01/07/2024   HDL 34 (L) 01/07/2024   LDLCALC 119 (H) 01/07/2024   TRIG 177 (H) 01/07/2024   CHOLHDL 5.4 (H) 01/07/2024    No results found for: LIPOA  Lab Results  Component Value Date   ALT 5 01/07/2024   AST 11 01/07/2024   ALKPHOS 69 01/07/2024   BILITOT 0.3 01/07/2024   Lab Results  Component Value Date   CREATININE 1.34 (H) 03/03/2023   BUN 16 03/03/2023   NA 141 03/03/2023   K 4.8 03/03/2023   CL 106 03/03/2023   CO2 21 03/03/2023   Lab Results  Component Value Date   HGBA1C 5.4 05/22/2021    Home Medications    Current Outpatient Medications  Medication Sig Dispense Refill   albuterol  (PROVENTIL   HFA;VENTOLIN  HFA) 108 (90 BASE) MCG/ACT inhaler Inhale 2 puffs into the lungs every 6 (six) hours as needed for wheezing.     aspirin  81 MG tablet Take 81 mg by mouth daily.     atorvastatin  (LIPITOR ) 80 MG tablet Take 1 tablet (80 mg total) by mouth daily. 90 tablet 3   clonazePAM  (KLONOPIN ) 0.5 MG tablet Take 0.5 mg by mouth daily as needed.     cyclobenzaprine  (FLEXERIL ) 10 MG tablet Take 10 mg by mouth as needed for muscle spasms.     divalproex (DEPAKOTE ER) 250 MG 24 hr tablet Take 250 mg by mouth 2 (two) times daily.     escitalopram (LEXAPRO) 20 MG tablet Take 20 mg by mouth every morning.     esomeprazole (NEXIUM) 40 MG capsule Take 40 mg by mouth every morning.     EUCRISA  2 % OINT Apply topically 2 (two) times daily.     ezetimibe  (ZETIA ) 10 MG tablet Take 1 tablet (10 mg total) by mouth daily. 30 tablet 3   famciclovir  (FAMVIR ) 250 MG tablet Take 250 mg by mouth 2 (two) times daily.     Ferrous Sulfate (IRON ) 325 (65 Fe) MG TABS Take 1 tablet (325 mg total) by mouth daily. 90 tablet 3  furosemide  (LASIX ) 20 MG tablet TAKE ONE TABLET EVERY MORNING 90 tablet 1   isosorbide  mononitrate (IMDUR ) 30 MG 24 hr tablet TAKE 1/2 TABLET BY MOUTH EVERY MORNING FOR BLOOD PRESSURE 45 tablet 3   losartan (COZAAR) 25 MG tablet Take 25 mg by mouth every morning.     meclizine  (ANTIVERT ) 25 MG tablet Take 25 mg by mouth as needed for dizziness or nausea.     nitroGLYCERIN  (NITROSTAT ) 0.4 MG SL tablet Place 1 tablet (0.4 mg total) under the tongue every 5 (five) minutes as needed for chest pain.     nystatin cream (MYCOSTATIN) Apply 1 Application topically 3 (three) times daily as needed for dry skin.     tiZANidine (ZANAFLEX) 2 MG tablet Take 2 mg by mouth 2 (two) times daily as needed.     TRELEGY ELLIPTA 100-62.5-25 MCG/ACT AEPB Take 1 puff by mouth daily.     triamcinolone  cream (KENALOG ) 0.1 % Apply 1 Application topically as needed.     vitamin B-12 (CYANOCOBALAMIN ) 1000 MCG tablet Take 1,000 mcg  by mouth in the morning and at bedtime.     VITAMIN D3 1000 units capsule Take 1,000 Units by mouth every morning.     No current facility-administered medications for this visit.     Assessment & Plan    Hyperlipidemia  Assessment: Current LDL is 119 mg/dL, goal is <44 mg/dL Explained the reasoning behind lowering LDL Taking atorvastatin  80 mg daily and ezetimibe  10 mg daily, tolerating both medication Review possible options, PCSK9 inhibitor and siRNA. We went over MOA, adverse effects, and how to take the medication. Addressed any questions the patients had.  Plan: Will apply for Repatha and contact patient about insurance coverage.   Will recheck lipid panel 3 months after starting Repatha    Shirlee GORMAN Langdon, PharmD Student [address]   [place], [place] [address] [phone]  07/18/2024, 9:50 PM  I was with patient and student for entire visit and agree with above assessment and plan.  Atley Scarboro PharmD  [facility]

## 2024-07-19 ENCOUNTER — Ambulatory Visit: Attending: Internal Medicine | Admitting: Pharmacist Clinician (PhC)/ Clinical Pharmacy Specialist

## 2024-07-19 ENCOUNTER — Encounter: Payer: Self-pay | Admitting: Pharmacist Clinician (PhC)/ Clinical Pharmacy Specialist

## 2024-07-19 DIAGNOSIS — E785 Hyperlipidemia, unspecified: Secondary | ICD-10-CM

## 2024-07-19 NOTE — Patient Instructions (Signed)
 Your Results:             Your most recent labs Goal  Total Cholesterol 185 < 200  Triglycerides 177 < 150  HDL (happy/good cholesterol) 34 > 40  LDL (lousy/bad cholesterol 119 < 55   Medication changes:  We will start the process to get Repatha covered by your insurance.  Once the prior authorization is complete, I will call message to let you know and confirm pharmacy information.   You will take one injection every 14 days  Lab orders:  We want to repeat labs after 3 months.  We will send you a lab order to remind you once we get closer to that time.     Thank you for choosing [facility]

## 2024-08-01 ENCOUNTER — Telehealth: Payer: Self-pay | Admitting: Cardiology

## 2024-08-01 NOTE — Telephone Encounter (Signed)
 Pt is requesting a callback from Kristin regarding a medication repatha she said was discussed but she hadn't heard anything. She'd like to f/u. Please advise

## 2024-08-02 ENCOUNTER — Other Ambulatory Visit (HOSPITAL_COMMUNITY): Payer: Self-pay

## 2024-08-02 ENCOUNTER — Telehealth: Payer: Self-pay | Admitting: Pharmacy Technician

## 2024-08-02 ENCOUNTER — Telehealth: Payer: Self-pay | Admitting: Pharmacist Clinician (PhC)/ Clinical Pharmacy Specialist

## 2024-08-02 NOTE — Telephone Encounter (Signed)
 Please do PA for Repatha

## 2024-08-02 NOTE — Telephone Encounter (Signed)
 Pharmacy Patient Advocate Encounter   Received notification from Physician's Office that prior authorization for repatha is required/requested.   Insurance verification completed.   The patient is insured through [employer].   Per test claim: PA required; PA submitted to above mentioned insurance via Latent Key/confirmation #/EOC [id number] Status is pending

## 2024-08-03 ENCOUNTER — Other Ambulatory Visit (HOSPITAL_COMMUNITY): Payer: Self-pay

## 2024-08-03 MED ORDER — REPATHA SURECLICK 140 MG/ML ~~LOC~~ SOAJ: 140.0000 mg | SUBCUTANEOUS | 3 refills | Status: AC

## 2024-08-03 NOTE — Addendum Note (Signed)
 Addended by: Maricus Tanzi L on: 08/03/2024 11:04 AM   Modules accepted: Orders

## 2024-08-03 NOTE — Telephone Encounter (Signed)
 Pharmacy Patient Advocate Encounter  Received notification from [facility] that Prior Authorization for Repath has been APPROVED from 08/03/24 to 01/31/25. Ran test claim, Copay is $0.00- one month. This test claim was processed through [facility]- copay amounts may vary at other pharmacies due to pharmacy/plan contracts, or as the patient moves through the different stages of their insurance plan.   PA #/Case ID/Reference #: [id number]

## 2024-08-03 NOTE — Telephone Encounter (Signed)
Patient aware. Will repeat labs in 3 months.

## 2024-08-10 NOTE — Progress Notes (Signed)
 Crystal [name]                                          MRN: [id number]   08/10/2024   The [facility] Quality Team Specialist reviewed this patient medical record for the purposes of chart review for care gap closure. The following were reviewed: chart review for care gap closure-kidney health evaluation for diabetes:eGFR  and uACR.    [facility] Quality Team

## 2024-09-12 ENCOUNTER — Other Ambulatory Visit: Payer: Self-pay

## 2024-09-14 MED ORDER — ISOSORBIDE MONONITRATE ER 30 MG PO TB24: 30.0000 mg | ORAL_TABLET | Freq: Every day | ORAL | 3 refills | Status: DC

## 2024-09-16 ENCOUNTER — Telehealth: Payer: Self-pay | Admitting: Cardiology

## 2024-09-16 MED ORDER — ISOSORBIDE MONONITRATE ER 30 MG PO TB24: 15.0000 mg | ORAL_TABLET | Freq: Every day | ORAL | Status: AC

## 2024-09-16 NOTE — Telephone Encounter (Signed)
 Pt c/o medication issue:  1. Name of Medication:   isosorbide  mononitrate (IMDUR ) 30 MG 24 hr tablet    2. How are you currently taking this medication (dosage and times per day)? As written   3. Are you having a reaction (difficulty breathing--STAT)? No   4. What is your medication issue? Katina with pharmacy called in stating pt has been taking half a tablet but the refill sent was for whole tablet daily. Please confirm if this is correct.

## 2024-09-16 NOTE — Addendum Note (Signed)
 Addended by: VICCI ROXIE CROME on: 09/16/2024 04:27 PM   Modules accepted: Orders

## 2024-09-16 NOTE — Telephone Encounter (Signed)
 Spoke with pharmacy and shared response from Dr. Jeffrie to continue Imdur  15 mg daily. Pharmacy will update Rx, I also corrected on patient's med list.

## 2024-09-16 NOTE — Telephone Encounter (Signed)
 [place] [facility] calling to check on dose of Imdur  for refill. They report that historically, patient has been on 15 mg Imdur  daily. They have received a refill order for 40 mg. Last OV note states to continue 15 mg dose. Med may have been ordered at higher dose due to clerical error. Forwarded to Dr. Jeffrie for advice.

## 2024-10-14 ENCOUNTER — Other Ambulatory Visit: Payer: Self-pay | Admitting: Vascular Surgery

## 2024-10-14 DIAGNOSIS — I739 Peripheral vascular disease, unspecified: Secondary | ICD-10-CM

## 2024-10-14 DIAGNOSIS — I6523 Occlusion and stenosis of bilateral carotid arteries: Secondary | ICD-10-CM
